# Patient Record
Sex: Male | Born: 1935
Health system: Southern US, Community
[De-identification: ages and names within clinical notes are randomized; demographics above are authoritative.]

## PROBLEM LIST (undated history)

## (undated) DIAGNOSIS — K219 Gastro-esophageal reflux disease without esophagitis: Secondary | ICD-10-CM

## (undated) DIAGNOSIS — I639 Cerebral infarction, unspecified: Secondary | ICD-10-CM

## (undated) DIAGNOSIS — C189 Malignant neoplasm of colon, unspecified: Secondary | ICD-10-CM

## (undated) DIAGNOSIS — D649 Anemia, unspecified: Secondary | ICD-10-CM

## (undated) DIAGNOSIS — R51 Headache: Secondary | ICD-10-CM

## (undated) DIAGNOSIS — R519 Headache, unspecified: Secondary | ICD-10-CM

## (undated) HISTORY — DX: Malignant neoplasm of colon, unspecified: C18.9

## (undated) HISTORY — DX: Headache: R51

## (undated) HISTORY — DX: Headache, unspecified: R51.9

## (undated) HISTORY — DX: Cerebral infarction, unspecified: I63.9

---

## 2004-04-25 ENCOUNTER — Emergency Department (HOSPITAL_COMMUNITY): Admission: EM | Admit: 2004-04-25 | Discharge: 2004-04-25 | Payer: Self-pay | Admitting: Emergency Medicine

## 2005-02-27 ENCOUNTER — Emergency Department (HOSPITAL_COMMUNITY): Admission: EM | Admit: 2005-02-27 | Discharge: 2005-02-27 | Payer: Self-pay | Admitting: *Deleted

## 2010-02-06 ENCOUNTER — Emergency Department (HOSPITAL_COMMUNITY): Admission: EM | Admit: 2010-02-06 | Discharge: 2010-02-06 | Payer: Self-pay | Admitting: Emergency Medicine

## 2010-06-01 ENCOUNTER — Inpatient Hospital Stay (HOSPITAL_COMMUNITY): Admit: 2010-06-01 | Discharge: 2010-06-03 | Payer: Self-pay | Attending: Neurosurgery | Admitting: Neurosurgery

## 2010-06-01 ENCOUNTER — Emergency Department (HOSPITAL_COMMUNITY)
Admission: EM | Admit: 2010-06-01 | Discharge: 2010-06-01 | Disposition: A | Discharge status: Other Acute Inpatient Hospital | Payer: Self-pay | Source: Home / Self Care | Admitting: Emergency Medicine

## 2010-06-24 NOTE — H&P (Signed)
NAME:  William Patel, William Patel               ACCOUNT NO.:  [id number]  MEDICAL RECORD NO.:  [id number]          PATIENT TYPE:  INP  LOCATION:  3102                         FACILITY:  [facility]  PHYSICIAN:  Sherilyn Cooter A. Shirlette Scarber, M.D.    DATE OF BIRTH:  08-13-1933  DATE OF ADMISSION:  06/01/2010 DATE OF DISCHARGE:                             HISTORY & PHYSICAL   ADMITTING DIAGNOSIS:  Subarachnoid hemorrhage, probable post-traumatic although aneurysmal subarachnoid hemorrhage also possible.  HISTORY OF PRESENT ILLNESS:  [name] is a 77 year old male who has history of vertigo who apparently last night following ingesting alcohol fell backwards and struck the back of his head.  The patient has had persistent bilateral retro-orbital headache since this fall.  He denies any headache prior to the fall, although his history is somewhat compromised by his alcohol ingestion the previous night.  The patient states that he has difficulty with vertigo and has had trouble with falls in the past.  The patient apparently took a rather significant fall on board at the back approximately 6 months ago in a similar fashion.  The patient has had no symptoms of seizures.  He has had no nausea or vomiting.  PAST MEDICAL HISTORY:  Notable for probable alcohol abuse.  Otherwise, no other ongoing major medical problems.  CURRENT MEDICATIONS:  None.  ALLERGIES:  None.  FAMILY HISTORY:  None.  SOCIAL HISTORY:  The patient is a nonsmoker.  Denies alcohol abuse or drug abuse.  REVIEW OF SYSTEMS:  Seen in the patient's chart.  It has not been reviewed.  It is noncontributory.  PHYSICAL EXAMINATION:  GENERAL:  On examination, he is pleasant and cooperative to examination.  Appears to medium build medium framed male who does not appear to be uncomfortable. HEAD, EARS, EYES, NOSE, AND THROAT:  Some mild occipital tenderness, but no marked scalp hematoma or swelling.  Oropharynx, nasopharynx, and external auditory canals  are clear. CHEST:  Clear to auscultation. HEART:  Regular rate and rhythm. ABDOMEN:  Benign. EXTREMITIES:  Free of injury. BACK:  Cervical spine has full active range of motion without pain or tenderness. NEUROLOGIC:  He is awake and alert.  He is oriented and appropriate. Cranial nerve function is intact.  Motor and sensory function in extremities is normal.  Deep tendon reflexes are normoactive.  There is no long track signs.  The patient has CT scan of his head.  This demonstrates evidence of nondepressed linear occipital skull fracture extending down to the foramen magnum on the right side.  The patient has had a small amount of blood along his gyrus rectus on the right side with some associated subarachnoid blood.  There is no high-volume of subarachnoid blood in the basilar cisterns however.  Most likely this represents a small post-traumatic subarachnoid hemorrhage, although given the location of this hemorrhage, it is mostly somewhat atypical for a traumatic subarachnoid hemorrhage and it is also in location it would be particularly worrisome for ruptured anterior communicating artery aneurysm.  With that in mind, I think the patient needs to have a CT angio to better evaluate this area.  I will plan on  following up with this later on this evening.  Otherwise, the patient will be admitted to the ICU for neurological monitoring.  Plan is for followup head CT scan in 24 hours.          ______________________________ Kathaleen Maser Shanyce Daris, M.D.     HAP/MEDQ  D:  06/01/2010  T:  06/02/2010  Job:  [id number]  Electronically Signed by Julio Sicks M.D. on 06/24/2010 08:15:05 AM

## 2010-08-11 LAB — ETHANOL: Alcohol, Ethyl (B): <5 mg/dL (ref 0–10)

## 2010-08-11 LAB — COMPREHENSIVE METABOLIC PANEL
ALT: 17 U/L (ref 0–53)
ALT: 14 U/L (ref 0–53)
AST: 25 U/L (ref 0–37)
AST: 23 U/L (ref 0–37)
Albumin: 4 g/dL (ref 3.5–5.2)
Albumin: 3.2 g/dL — ABNORMAL LOW (ref 3.5–5.2)
Alkaline Phosphatase: 62 U/L (ref 39–117)
Alkaline Phosphatase: 50 U/L (ref 39–117)
BUN: 26 mg/dL — ABNORMAL HIGH (ref 6–23)
CO2: 28 mEq/L (ref 19–32)
CO2: 26 mEq/L (ref 19–32)
Calcium: 8.6 mg/dL (ref 8.4–10.5)
Chloride: 100 mEq/L (ref 96–112)
Chloride: 105 mEq/L (ref 96–112)
Creatinine, Ser: 1.15 mg/dL (ref 0.4–1.5)
Creatinine, Ser: 1.08 mg/dL (ref 0.4–1.5)
GFR calc Af Amer: >60 mL/min (ref >60)
GFR calc non Af Amer: >60 mL/min (ref >60)
GFR calc non Af Amer: >60 mL/min (ref >60)
Glucose, Bld: 106 mg/dL — ABNORMAL HIGH (ref 70–99)
Glucose, Bld: 110 mg/dL — ABNORMAL HIGH (ref 70–99)
Potassium: 4.5 mEq/L (ref 3.5–5.1)
Potassium: 3.8 mEq/L (ref 3.5–5.1)
Sodium: 139 mEq/L (ref 135–145)
Sodium: 138 mEq/L (ref 135–145)
Total Bilirubin: 1.4 mg/dL — ABNORMAL HIGH (ref 0.3–1.2)
Total Bilirubin: 1.1 mg/dL (ref 0.3–1.2)
Total Protein: 7.2 g/dL (ref 6.0–8.3)

## 2010-08-11 LAB — DIFFERENTIAL
Basophils Absolute: 0 10*3/uL (ref 0.0–0.1)
Basophils Relative: 0 % (ref 0–1)
Lymphocytes Relative: 8 % — ABNORMAL LOW (ref 12–46)
Lymphs Abs: 0.6 10*3/uL — ABNORMAL LOW (ref 0.7–4.0)
Monocytes Absolute: 0.7 10*3/uL (ref 0.1–1.0)
Neutro Abs: 6.5 10*3/uL (ref 1.7–7.7)
Neutrophils Relative %: 83 % — ABNORMAL HIGH (ref 43–77)

## 2010-08-11 LAB — CBC
HCT: 44.6 % (ref 39.0–52.0)
HCT: 38.3 % — ABNORMAL LOW (ref 39.0–52.0)
Hemoglobin: 15.6 g/dL (ref 13.0–17.0)
Hemoglobin: 13.4 g/dL (ref 13.0–17.0)
MCH: 33.1 pg (ref 26.0–34.0)
MCH: 32.8 pg (ref 26.0–34.0)
MCHC: 35 g/dL (ref 30.0–36.0)
MCV: 94.5 fL (ref 78.0–100.0)
Platelets: 140 10*3/uL — ABNORMAL LOW (ref 150–400)
Platelets: 119 10*3/uL — ABNORMAL LOW (ref 150–400)
RBC: 4.72 MIL/uL (ref 4.22–5.81)
RBC: 4.08 MIL/uL — ABNORMAL LOW (ref 4.22–5.81)
RDW: 12 % (ref 11.5–15.5)
WBC: 7.7 10*3/uL (ref 4.0–10.5)

## 2010-08-11 LAB — PROTIME-INR
INR: 1.04 (ref 0.00–1.49)
INR: 1.02 (ref 0.00–1.49)
Prothrombin Time: 13.8 seconds (ref 11.6–15.2)
Prothrombin Time: 13.6 seconds (ref 11.6–15.2)

## 2010-08-11 LAB — POCT CARDIAC MARKERS: Troponin i, poc: <0.05 ng/mL (ref 0.00–0.09)

## 2010-08-11 LAB — APTT: aPTT: 28 seconds (ref 24–37)

## 2010-08-14 LAB — POCT CARDIAC MARKERS
CKMB, poc: <1 ng/mL — ABNORMAL LOW (ref 1.0–8.0)
Myoglobin, poc: 46.1 ng/mL (ref 12–200)
Troponin i, poc: <0.05 ng/mL (ref 0.00–0.09)

## 2010-08-14 LAB — URINALYSIS, ROUTINE W REFLEX MICROSCOPIC
Bilirubin Urine: NEGATIVE
Glucose, UA: NEGATIVE mg/dL
Hgb urine dipstick: NEGATIVE
Ketones, ur: NEGATIVE mg/dL
Nitrite: NEGATIVE
Protein, ur: NEGATIVE mg/dL
Specific Gravity, Urine: 1.006 (ref 1.005–1.030)
Urobilinogen, UA: 0.2 mg/dL (ref 0.0–1.0)
pH: 5 (ref 5.0–8.0)

## 2010-08-14 LAB — DIFFERENTIAL
Basophils Absolute: 0 10*3/uL (ref 0.0–0.1)
Basophils Relative: 0 % (ref 0–1)
Eosinophils Absolute: 0.2 10*3/uL (ref 0.0–0.7)
Eosinophils Relative: 3 % (ref 0–5)
Lymphocytes Relative: 18 % (ref 12–46)
Lymphs Abs: 1.1 10*3/uL (ref 0.7–4.0)
Monocytes Absolute: 0.5 10*3/uL (ref 0.1–1.0)
Monocytes Relative: 9 % (ref 3–12)
Neutro Abs: 4 10*3/uL (ref 1.7–7.7)
Neutrophils Relative %: 69 % (ref 43–77)

## 2010-08-14 LAB — COMPREHENSIVE METABOLIC PANEL
ALT: 18 U/L (ref 0–53)
AST: 24 U/L (ref 0–37)
Albumin: 4.1 g/dL (ref 3.5–5.2)
Alkaline Phosphatase: 56 U/L (ref 39–117)
BUN: 10 mg/dL (ref 6–23)
CO2: 25 mEq/L (ref 19–32)
Calcium: 9.1 mg/dL (ref 8.4–10.5)
Chloride: 108 mEq/L (ref 96–112)
Creatinine, Ser: 0.94 mg/dL (ref 0.4–1.5)
GFR calc Af Amer: >60 mL/min (ref >60)
GFR calc non Af Amer: >60 mL/min (ref >60)
Glucose, Bld: 77 mg/dL (ref 70–99)
Potassium: 4 mEq/L (ref 3.5–5.1)
Sodium: 142 mEq/L (ref 135–145)
Total Bilirubin: 0.8 mg/dL (ref 0.3–1.2)
Total Protein: 6.9 g/dL (ref 6.0–8.3)

## 2010-08-14 LAB — CBC
HCT: 42.3 % (ref 39.0–52.0)
Hemoglobin: 14.9 g/dL (ref 13.0–17.0)
MCH: 34.1 pg — ABNORMAL HIGH (ref 26.0–34.0)
MCHC: 35.3 g/dL (ref 30.0–36.0)
MCV: 96.6 fL (ref 78.0–100.0)
Platelets: 121 10*3/uL — ABNORMAL LOW (ref 150–400)
RBC: 4.38 MIL/uL (ref 4.22–5.81)
RDW: 12.7 % (ref 11.5–15.5)
WBC: 5.8 10*3/uL (ref 4.0–10.5)

## 2010-08-14 LAB — LIPASE, BLOOD: Lipase: 38 U/L (ref 11–59)

## 2011-06-26 ENCOUNTER — Ambulatory Visit (INDEPENDENT_AMBULATORY_CARE_PROVIDER_SITE_OTHER): Payer: Medicare Other

## 2011-06-26 DIAGNOSIS — M545 Low back pain: Secondary | ICD-10-CM

## 2011-06-26 DIAGNOSIS — H6120 Impacted cerumen, unspecified ear: Secondary | ICD-10-CM

## 2011-06-26 DIAGNOSIS — H8110 Benign paroxysmal vertigo, unspecified ear: Secondary | ICD-10-CM

## 2011-06-26 DIAGNOSIS — H9209 Otalgia, unspecified ear: Secondary | ICD-10-CM

## 2011-07-03 HISTORY — PX: COLONOSCOPY: SHX174

## 2011-07-09 ENCOUNTER — Telehealth: Payer: Self-pay

## 2011-07-09 NOTE — Telephone Encounter (Signed)
.  [facility]  Please fax ov,labs,x-ray etc to [facility] gastrology  [phone]

## 2011-07-10 NOTE — Telephone Encounter (Signed)
Need signed release to send records

## 2011-08-05 ENCOUNTER — Encounter (INDEPENDENT_AMBULATORY_CARE_PROVIDER_SITE_OTHER): Payer: Self-pay | Admitting: General Surgery

## 2011-08-06 ENCOUNTER — Encounter (INDEPENDENT_AMBULATORY_CARE_PROVIDER_SITE_OTHER): Payer: Self-pay | Admitting: Surgery

## 2011-08-06 ENCOUNTER — Encounter (INDEPENDENT_AMBULATORY_CARE_PROVIDER_SITE_OTHER): Payer: Self-pay | Admitting: General Surgery

## 2011-08-06 ENCOUNTER — Ambulatory Visit (INDEPENDENT_AMBULATORY_CARE_PROVIDER_SITE_OTHER): Payer: Medicare Other | Admitting: Surgery

## 2011-08-06 VITALS — BP 130/72 | HR 64 | Temp 98.0°F | Resp 18 | Ht 62.0 in | Wt 135.4 lb

## 2011-08-06 DIAGNOSIS — I499 Cardiac arrhythmia, unspecified: Secondary | ICD-10-CM

## 2011-08-06 DIAGNOSIS — C189 Malignant neoplasm of colon, unspecified: Secondary | ICD-10-CM

## 2011-08-06 DIAGNOSIS — Z720 Tobacco use: Secondary | ICD-10-CM | POA: Insufficient documentation

## 2011-08-06 DIAGNOSIS — F17200 Nicotine dependence, unspecified, uncomplicated: Secondary | ICD-10-CM

## 2011-08-06 DIAGNOSIS — I498 Other specified cardiac arrhythmias: Secondary | ICD-10-CM

## 2011-08-06 NOTE — Patient Instructions (Signed)
Will see the cardiologist for cardiac clearance. Return to see me and we will discuss surgery and can get that scheduled if that is what you decide to do.

## 2011-08-06 NOTE — Progress Notes (Signed)
Chief Complaint:  Left colon cancer  History of Present Illness:  William Patel is an 78 y.o. male :  See following dictation: Mr. Davoli and his family came in to discuss a recent finding by Dr. Evette Cristal of a descending colon cancer. This was found on a screening colonoscopy biopsy-proven adenocarcinoma. He 78 years old and is followed by Dr. Windle Guard. I sat down with him and his children and discussed the surgery in some detail. I mentioned the possibility of a temporary colostomy although don't think that is likely. He has a friend who had this operation about 5 years ago. He was somewhat emotional over the fact that he lost his wife to metastatic melanoma this past year and also lost a daughter to lung cancer within the last year or so. He's never had surgery so he's uncomfortable about think that going to the hospital.  Past medical history medications include meclizine one every 6 hours. He has a review of systems positive for occasional chills nasal congestion, sore throat, visual disturbance, leg swelling, anal rectal problems including hemorrhoids, rectal pain, arthritis, blood in urine, headaches, easy bruisability. His parents died in their late 26s or 90s of a brain tumor brain cancer. He chews tobacco and occasionally smokes cigars. He has previously smoked. He does not use any drugs. He occasionally drinks alcohol he  On physical exam his weight is 135.4 blood pressure 130/72 pulse 64 apical and somewhat irregular with either PVCs orifice his atrial fibrillation. He is 5 feet 2 inches is afebrile and his respirations were unlabored and 18.  HEENT exam is unremarkable neck I cannot discern any bruits. No masses or adenopathy noted. Chest breath sounds were equal bilaterally. Heart has a regular rhythm slow rate in the 60s. Abdomen is nontender. He is not obese. Extremity exam he has full range of motion. He is active. Neuro alert and oriented x3  Emergency function grossly intact. Psych  appears appropriate.  Impression left colon cancer. Recommendation laparoscopically assisted left hemicolectomy. I gave him a booklet on this operation and discussed it with him in some detail. First order of business is to have him evaluated by cardiologist and make sure that his cardiopulmonary status is satisfactory and he can undergo general anesthesia. Would then plan a bowel prep and lap-assisted left hemicolectomy [facility].  Past Medical History  Diagnosis Date  . Vertigo   . Visual disturbances   . Leg swelling   . Rectal bleeding   . Hematuria   . Generalized headaches     No past surgical history on file.  Current Outpatient Prescriptions  Medication Sig Dispense Refill  . meclizine (ANTIVERT) 25 MG tablet Take 25 mg by mouth 3 (three) times daily as needed.       Review of patient's allergies indicates no known allergies. Family History  Problem Relation Age of Onset  . Heart failure Mother    Social History:   reports that he has been smoking Cigars.  His smokeless tobacco use includes Chew. He reports that he drinks alcohol. He reports that he does not use illicit drugs.   REVIEW OF SYSTEMS - PERTINENT POSITIVES ONLY: See above ROS  Physical Exam:   Blood pressure 130/72, pulse 64, temperature 98 F (36.7 C), temperature source Temporal, resp. rate 18, height 5\' 2"  (1.575 m), weight 135 lb 6.4 oz (61.417 kg). Body mass index is 24.76 kg/(m^2).  Gen:  WDWN white male NAD  Neurological: Alert and oriented to person, place, and  time. Motor and sensory function is grossly intact  Head: Normocephalic and atraumatic.  Eyes: Conjunctivae are normal. Pupils are equal, round, and reactive to light. No scleral icterus.  Neck: Normal range of motion. Neck supple. No tracheal deviation or thyromegaly present.  Cardiovascular:  SR without murmurs or gallops.  No carotid bruits Respiratory: Effort normal.  No respiratory distress. No chest wall tenderness. Breath sounds  normal.  No wheezes, rales or rhonchi.  Abdomen:  Nontender.  GU: Musculoskeletal: Normal range of motion. Extremities are nontender. No cyanosis, edema or clubbing noted Lymphadenopathy: No cervical, preauricular, postauricular or axillary adenopathy is present Skin: Skin is warm and dry. No rash noted. No diaphoresis. No erythema. No pallor. Pscyh: Normal mood and affect. Behavior is normal. Judgment and thought content normal.   LABORATORY RESULTS: No results found for this or any previous visit (from the past 48 hour(s)).  RADIOLOGY RESULTS: No results found.  Problem List: There is no problem list on file for this patient.   Assessment & Plan: Appears early left colon cancer.  Will schedule after cardiac clearance.     Matt B. Daphine Deutscher, MD, [facility] [phone] beeper [phone]  08/06/2011 10:04 AM

## 2011-08-13 ENCOUNTER — Encounter: Payer: Self-pay | Admitting: Cardiology

## 2011-08-13 ENCOUNTER — Ambulatory Visit (INDEPENDENT_AMBULATORY_CARE_PROVIDER_SITE_OTHER): Payer: Medicare Other | Admitting: Cardiology

## 2011-08-13 VITALS — BP 122/72 | HR 87 | Ht 62.0 in | Wt 139.0 lb

## 2011-08-13 DIAGNOSIS — I4949 Other premature depolarization: Secondary | ICD-10-CM

## 2011-08-13 DIAGNOSIS — C189 Malignant neoplasm of colon, unspecified: Secondary | ICD-10-CM

## 2011-08-13 DIAGNOSIS — I493 Ventricular premature depolarization: Secondary | ICD-10-CM | POA: Insufficient documentation

## 2011-08-13 DIAGNOSIS — Z01810 Encounter for preprocedural cardiovascular examination: Secondary | ICD-10-CM

## 2011-08-13 NOTE — Progress Notes (Signed)
   William Patel Date of Birth: [birth year] Medical Record #[id number]  History of Present Illness: William Patel is seen at the request of Dr. Daphine Deutscher for preoperative clearance for colon surgery. He is a pleasant 78 year old white male who has been in excellent health. He states he has never been hospitalized. He has no known history of cardiac disease and denies any prior history of myocardial infarction or heart failure. He has been noted to have a skip in his heartbeat. This is asymptomatic. He denies any palpitations or dizziness. He is very active and works as a Visual merchandiser. This includes cutting his own wood and doing his own gardening and plowing. He has had no restrictions in his activity. He has no history of hypertension, diabetes, or hypercholesterolemia. He doesn't smoke cigarettes but does chew tobacco. Recently he had a heme positive stool and subsequent evaluation demonstrated adenocarcinoma. It is recommended that he undergo a left hemicolectomy. He has had no prior surgery.  Current Outpatient Prescriptions on File Prior to Visit  Medication Sig Dispense Refill  . meclizine (ANTIVERT) 25 MG tablet Take 25 mg by mouth 3 (three) times daily as needed.        No Known Allergies  Past Medical History  Diagnosis Date  . Vertigo   . Visual disturbances   . Leg swelling   . Rectal bleeding   . Hematuria   . Generalized headaches   . Colon cancer     Left     Past Surgical History  Procedure Date  . Colonoscopy     History  Smoking status  . Current Some Day Smoker  . Types: Cigars  Smokeless tobacco  . Current User  . Types: Chew    History  Alcohol Use  . Yes    Family History  Problem Relation Age of Onset  . Heart failure Mother   . Brain cancer Mother   . Brain cancer Father     Review of Systems: As noted in history of present illness.  All other systems were reviewed and are negative.  Physical Exam: BP 122/72  Pulse 87  Ht 5\' 2"  (1.575 m)  Wt 139  lb (63.05 kg)  BMI 25.42 kg/m2 He is a pleasant white male in no acute distress. He is normocephalic, atraumatic. Pupils are equal round and reactive to light accommodation. Extraocular movements are full. Oropharynx is clear. Neck is supple without JVD, adenopathy, thyromegaly, or bruits. Lungs are clear. Cardiac exam reveals a regular rate and rhythm without gallop, murmur, or click. Abdomen is soft and nontender. He has no masses or bruits lung exam. Femoral and pedal pulses are 2+ and symmetric. His hands are heavily calloused. Skin is warm and dry. He is alert and oriented x3. Cranial nerves II through XII are intact. LABORATORY DATA: ECG demonstrates normal sinus rhythm with occasional unifocal PVC. It is otherwise normal.  Assessment / Plan:

## 2011-08-13 NOTE — Patient Instructions (Signed)
We will clear you for your colon surgery and send notice to Dr. Daphine Deutscher  I would avoid caffeine and tobacco products.

## 2011-08-13 NOTE — Assessment & Plan Note (Signed)
He has unifocal PVCs that are asymptomatic. The scary a low risk. I have recommended that he restrict his caffeine intake and aluminate tobacco.

## 2011-08-13 NOTE — Assessment & Plan Note (Addendum)
I think that William Patel is an acceptable risk for colon surgery. He has no history of cardiac disease. He is asymptomatic and is clearly able to work to a greater than 7 METs level. Other than PVCs his ECG is normal. His exam is normal. I don't feel that further preoperative testing is indicated given his low risk by history and exam. We will go ahead and clear him for his planned surgery.

## 2011-09-03 ENCOUNTER — Encounter (INDEPENDENT_AMBULATORY_CARE_PROVIDER_SITE_OTHER): Payer: Self-pay | Admitting: Surgery

## 2011-09-03 ENCOUNTER — Ambulatory Visit (INDEPENDENT_AMBULATORY_CARE_PROVIDER_SITE_OTHER): Payer: Medicare Other | Admitting: Surgery

## 2011-09-03 ENCOUNTER — Other Ambulatory Visit (INDEPENDENT_AMBULATORY_CARE_PROVIDER_SITE_OTHER): Payer: Self-pay | Admitting: Surgery

## 2011-09-03 VITALS — BP 133/78 | HR 88 | Temp 97.8°F | Resp 16 | Ht 61.75 in | Wt 138.0 lb

## 2011-09-03 DIAGNOSIS — C189 Malignant neoplasm of colon, unspecified: Secondary | ICD-10-CM

## 2011-09-03 NOTE — Progress Notes (Signed)
Chief Complaint: Left colon cancer  History of Present Illness: William Patel is an 78 y.o. male : See following dictation:  Mr. Marxen and his family came in to discuss a recent finding by Dr. Evette Cristal of a descending colon cancer. This was found on a screening colonoscopy biopsy-proven adenocarcinoma. He 78 years old and is followed by Dr. Windle Guard. I sat down with him and his children and discussed the surgery in some detail. I mentioned the possibility of a temporary colostomy although don't think that is likely. He has a friend who had this operation about 5 years ago. He was somewhat emotional over the fact that he lost his wife to metastatic melanoma this past year and also lost a daughter to lung cancer within the last year or so. He's never had surgery so he's uncomfortable about think that going to the hospital.  Past medical history medications include meclizine one every 6 hours. He has a review of systems positive for occasional chills nasal congestion, sore throat, visual disturbance, leg swelling, anal rectal problems including hemorrhoids, rectal pain, arthritis, blood in urine, headaches, easy bruisability. His parents died in their late 47s or 90s of a brain tumor brain cancer. He chews tobacco and occasionally smokes cigars. He has previously smoked. He does not use any drugs. He occasionally drinks alcohol he  On physical exam his weight is 135.4 blood pressure 130/72 pulse 64 apical and somewhat irregular with either PVCs orifice his atrial fibrillation. He is 5 feet 2 inches is afebrile and his respirations were unlabored and 18.  HEENT exam is unremarkable neck I cannot discern any bruits. No masses or adenopathy noted. Chest breath sounds were equal bilaterally. Heart has a regular rhythm slow rate in the 60s. Abdomen is nontender. He is not obese. Extremity exam he has full range of motion. He is active. Neuro alert and oriented x3  Emergency function grossly intact. Psych appears  appropriate.  Impression left colon cancer. Recommendation laparoscopically assisted left hemicolectomy. I gave him a booklet on this operation and discussed it with him in some detail. First order of business is to have him evaluated by cardiologist and make sure that his cardiopulmonary status is satisfactory and he can undergo general anesthesia. Would then plan a bowel prep and lap-assisted left hemicolectomy [facility].  Past Medical History   Diagnosis  Date   .  Vertigo    .  Visual disturbances    .  Leg swelling    .  Rectal bleeding    .  Hematuria    .  Generalized headaches     No past surgical history on file.  Current Outpatient Prescriptions   Medication  Sig  Dispense  Refill   .  meclizine (ANTIVERT) 25 MG tablet  Take 25 mg by mouth 3 (three) times daily as needed.      Review of patient's allergies indicates no known allergies.  Family History   Problem  Relation  Age of Onset   .  Heart failure  Mother     Social History: reports that he has been smoking Cigars. His smokeless tobacco use includes Chew. He reports that he drinks alcohol. He reports that he does not use illicit drugs.  REVIEW OF SYSTEMS - PERTINENT POSITIVES ONLY:  See above ROS  Physical Exam:  Blood pressure 130/72, pulse 64, temperature 98 F (36.7 C), temperature source Temporal, resp. rate 18, height 5\' 2"  (1.575 m), weight 135 lb 6.4 oz (61.417 kg).  Body mass index is 24.76 kg/(m^2).  Gen: WDWN white male NAD  Neurological: Alert and oriented to person, place, and time. Motor and sensory function is grossly intact  Head: Normocephalic and atraumatic.  Eyes: Conjunctivae are normal. Pupils are equal, round, and reactive to light. No scleral icterus.  Neck: Normal range of motion. Neck supple. No tracheal deviation or thyromegaly present.  Cardiovascular: SR without murmurs or gallops. No carotid bruits  Respiratory: Effort normal. No respiratory distress. No chest wall tenderness. Breath  sounds normal. No wheezes, rales or rhonchi.  Abdomen: Nontender.  GU:  Musculoskeletal: Normal range of motion. Extremities are nontender. No cyanosis, edema or clubbing noted Lymphadenopathy: No cervical, preauricular, postauricular or axillary adenopathy is present Skin: Skin is warm and dry. No rash noted. No diaphoresis. No erythema. No pallor. Pscyh: Normal mood and affect. Behavior is normal. Judgment and thought content normal.  LABORATORY RESULTS:  No results found for this or any previous visit (from the past 48 hour(s)).  RADIOLOGY RESULTS:  No results found.  Problem List:  There is no problem list on file for this patient.   Assessment & Plan:  Appears early left colon cancer. Had cardiac clearance by Dr. Peter [place].  He and is friend came for preop visit and bowel prep given.  Matt B. Daphine Deutscher, MD, [facility]  [phone] beeper  [phone]

## 2011-09-03 NOTE — Patient Instructions (Signed)
Take bowel prep as given

## 2011-09-14 ENCOUNTER — Encounter (HOSPITAL_COMMUNITY): Payer: Self-pay | Admitting: Pharmacy Technician

## 2011-09-16 ENCOUNTER — Encounter (HOSPITAL_COMMUNITY): Payer: Self-pay

## 2011-09-16 ENCOUNTER — Encounter (HOSPITAL_COMMUNITY)
Admission: RE | Admit: 2011-09-16 | Discharge: 2011-09-16 | Disposition: A | Discharge status: Home / Self Care | Payer: Medicare Other | Source: Ambulatory Visit | Attending: Surgery | Admitting: Surgery

## 2011-09-16 LAB — SURGICAL PCR SCREEN
MRSA, PCR: NEGATIVE
Staphylococcus aureus: NEGATIVE

## 2011-09-16 LAB — CBC
Platelets: 145 10*3/uL — ABNORMAL LOW (ref 150–400)
RBC: 4.62 MIL/uL (ref 4.22–5.81)
WBC: 5.8 10*3/uL (ref 4.0–10.5)

## 2011-09-16 NOTE — Patient Instructions (Addendum)
20 William Patel  09/16/2011   Your procedure is scheduled on:  Monday 09-21-2011  Report to [facility] at 1230 PM.  Call this number if you have problems the morning of surgery: [phone]   Remember:fleets enema night before surgery, follow one day bowel prep instructions from dr Daphine Deutscher.             stop advil 5 days before surgery  Do not  drink liquids:After Midnight.  .  Take these medicines the morning of surgery with A SIP OF WATER: claritin if needed, no chewing tobacco day of surgery.  Do not wear jewelry or make up.  Do not wear lotions, powders, or perfumes.Do not wear deodorant.    Do not bring valuables to the hospital.  Contacts, dentures or bridgework may not be worn into surgery.  Leave suitcase in the car. After surgery it may be brought to your room.  For patients admitted to the hospital, checkout time is 11:00 AM the day of discharge.     Special Instructions: CHG Shower Use Special Wash: 1/2 bottle night before surgery and 1/2 bottle morning of surgery, neck down avoid front and back private area   Please read over the following fact sheets that you were given: MRSA Information, blood fact sheet  [name] pre op nurse phone number [phone], call if needed

## 2011-09-21 ENCOUNTER — Encounter (HOSPITAL_COMMUNITY): Payer: Self-pay | Admitting: Anesthesiology

## 2011-09-21 ENCOUNTER — Ambulatory Visit (HOSPITAL_COMMUNITY): Payer: Medicare Other | Admitting: Anesthesiology

## 2011-09-21 ENCOUNTER — Inpatient Hospital Stay (HOSPITAL_COMMUNITY)
Admission: RE | Admit: 2011-09-21 | Discharge: 2011-09-27 | DRG: 330 | Disposition: A | Discharge status: Home / Self Care | Payer: Medicare Other | Source: Ambulatory Visit | Attending: Surgery | Admitting: Surgery

## 2011-09-21 ENCOUNTER — Encounter (HOSPITAL_COMMUNITY)
Admission: RE | Disposition: A | Discharge status: Home / Self Care | Payer: Self-pay | Source: Ambulatory Visit | Attending: Surgery

## 2011-09-21 ENCOUNTER — Encounter (HOSPITAL_COMMUNITY): Payer: Self-pay | Admitting: *Deleted

## 2011-09-21 DIAGNOSIS — I4949 Other premature depolarization: Secondary | ICD-10-CM | POA: Diagnosis present

## 2011-09-21 DIAGNOSIS — C184 Malignant neoplasm of transverse colon: Secondary | ICD-10-CM

## 2011-09-21 DIAGNOSIS — Z01810 Encounter for preprocedural cardiovascular examination: Secondary | ICD-10-CM

## 2011-09-21 DIAGNOSIS — K648 Other hemorrhoids: Secondary | ICD-10-CM | POA: Diagnosis present

## 2011-09-21 DIAGNOSIS — C189 Malignant neoplasm of colon, unspecified: Secondary | ICD-10-CM

## 2011-09-21 DIAGNOSIS — F17200 Nicotine dependence, unspecified, uncomplicated: Secondary | ICD-10-CM | POA: Diagnosis present

## 2011-09-21 DIAGNOSIS — M129 Arthropathy, unspecified: Secondary | ICD-10-CM | POA: Diagnosis present

## 2011-09-21 DIAGNOSIS — Z79899 Other long term (current) drug therapy: Secondary | ICD-10-CM

## 2011-09-21 DIAGNOSIS — K560 Paralytic ileus: Secondary | ICD-10-CM | POA: Diagnosis not present

## 2011-09-21 HISTORY — PX: COLON RESECTION: SHX5231

## 2011-09-21 LAB — CREATININE, SERUM
Creatinine, Ser: 1.06 mg/dL (ref 0.50–1.35)
GFR calc Af Amer: 77 mL/min — ABNORMAL LOW (ref >90)

## 2011-09-21 LAB — CBC
Platelets: 122 10*3/uL — ABNORMAL LOW (ref 150–400)
RBC: 4.17 MIL/uL — ABNORMAL LOW (ref 4.22–5.81)
WBC: 5.1 10*3/uL (ref 4.0–10.5)

## 2011-09-21 LAB — TYPE AND SCREEN: Antibody Screen: NEGATIVE

## 2011-09-21 LAB — ABO/RH: ABO/RH(D): O POS

## 2011-09-21 SURGERY — COLON RESECTION LAPAROSCOPIC
Anesthesia: General | Site: Abdomen | Wound class: Clean Contaminated

## 2011-09-21 MED ORDER — ONDANSETRON HCL 4 MG/2ML IJ SOLN
INTRAMUSCULAR | Status: DC | PRN
  Administered 2011-09-21: 4 mg via INTRAVENOUS

## 2011-09-21 MED ORDER — KETAMINE HCL 10 MG/ML IJ SOLN
INTRAMUSCULAR | Status: DC | PRN
  Administered 2011-09-21: 20 mg via INTRAVENOUS
  Administered 2011-09-21 (×3): 10 mg via INTRAVENOUS

## 2011-09-21 MED ORDER — ACETAMINOPHEN 10 MG/ML IV SOLN
INTRAVENOUS | Status: AC
  Filled 2011-09-21: qty 100

## 2011-09-21 MED ORDER — LACTATED RINGERS IR SOLN
Status: DC | PRN
  Administered 2011-09-21: 1000 mL

## 2011-09-21 MED ORDER — HYDROMORPHONE HCL PF 1 MG/ML IJ SOLN
0.2500 mg | INTRAMUSCULAR | Status: DC | PRN
  Administered 2011-09-21: 0.25 mg via INTRAVENOUS

## 2011-09-21 MED ORDER — FENTANYL CITRATE 0.05 MG/ML IJ SOLN
INTRAMUSCULAR | Status: DC | PRN
  Administered 2011-09-21: 50 ug via INTRAVENOUS
  Administered 2011-09-21: 100 ug via INTRAVENOUS
  Administered 2011-09-21 (×5): 50 ug via INTRAVENOUS

## 2011-09-21 MED ORDER — HETASTARCH-ELECTROLYTES 6 % IV SOLN
INTRAVENOUS | Status: DC | PRN
  Administered 2011-09-21: 17:00:00 via INTRAVENOUS

## 2011-09-21 MED ORDER — HEPARIN SODIUM (PORCINE) 5000 UNIT/ML IJ SOLN
INTRAMUSCULAR | Status: AC
  Filled 2011-09-21: qty 1

## 2011-09-21 MED ORDER — PROPOFOL 10 MG/ML IV EMUL
INTRAVENOUS | Status: DC | PRN
  Administered 2011-09-21: 110 mg via INTRAVENOUS

## 2011-09-21 MED ORDER — ACETAMINOPHEN 10 MG/ML IV SOLN
INTRAVENOUS | Status: DC | PRN
  Administered 2011-09-21: 1000 mg via INTRAVENOUS

## 2011-09-21 MED ORDER — HYDROMORPHONE HCL PF 1 MG/ML IJ SOLN
INTRAMUSCULAR | Status: DC | PRN
  Administered 2011-09-21: .6 mg via INTRAVENOUS

## 2011-09-21 MED ORDER — CISATRACURIUM BESYLATE 2 MG/ML IV SOLN
INTRAVENOUS | Status: DC | PRN
  Administered 2011-09-21: 4 mg via INTRAVENOUS
  Administered 2011-09-21: 8 mg via INTRAVENOUS
  Administered 2011-09-21: 4 mg via INTRAVENOUS

## 2011-09-21 MED ORDER — NICOTINE 7 MG/24HR TD PT24
7.0000 mg | MEDICATED_PATCH | Freq: Every day | TRANSDERMAL | Status: DC
  Administered 2011-09-21 – 2011-09-27 (×7): 7 mg via TRANSDERMAL
  Filled 2011-09-21 (×7): qty 1

## 2011-09-21 MED ORDER — MORPHINE SULFATE 2 MG/ML IJ SOLN
0.5000 mg | INTRAMUSCULAR | Status: DC | PRN
  Administered 2011-09-22 – 2011-09-25 (×11): 0.5 mg via INTRAVENOUS
  Filled 2011-09-21 (×10): qty 1

## 2011-09-21 MED ORDER — BUPIVACAINE HCL 0.5 % IJ SOLN
INTRAMUSCULAR | Status: AC
  Filled 2011-09-21: qty 1

## 2011-09-21 MED ORDER — MEPERIDINE HCL 50 MG/ML IJ SOLN: 6.2500 mg | INTRAMUSCULAR | Status: DC | PRN

## 2011-09-21 MED ORDER — KCL IN DEXTROSE-NACL 20-5-0.45 MEQ/L-%-% IV SOLN
INTRAVENOUS | Status: DC
  Administered 2011-09-21 – 2011-09-22 (×2): 100 mL/h via INTRAVENOUS
  Administered 2011-09-22 – 2011-09-23 (×3): via INTRAVENOUS
  Administered 2011-09-24: 100 mL via INTRAVENOUS
  Administered 2011-09-24: 17:00:00 via INTRAVENOUS
  Administered 2011-09-25: 100 mL via INTRAVENOUS
  Administered 2011-09-25 – 2011-09-27 (×3): via INTRAVENOUS
  Filled 2011-09-21 (×14): qty 1000

## 2011-09-21 MED ORDER — HYDROMORPHONE HCL PF 1 MG/ML IJ SOLN
INTRAMUSCULAR | Status: AC
  Filled 2011-09-21: qty 1

## 2011-09-21 MED ORDER — LACTATED RINGERS IV SOLN: INTRAVENOUS | Status: DC

## 2011-09-21 MED ORDER — LIDOCAINE HCL (CARDIAC) 20 MG/ML IV SOLN
INTRAVENOUS | Status: DC | PRN
  Administered 2011-09-21: 75 mg via INTRAVENOUS

## 2011-09-21 MED ORDER — BUPIVACAINE LIPOSOME 1.3 % IJ SUSP
20.0000 mL | Freq: Once | INTRAMUSCULAR | Status: AC
  Administered 2011-09-21: 20 mL
  Filled 2011-09-21: qty 20

## 2011-09-21 MED ORDER — DEXAMETHASONE SODIUM PHOSPHATE 10 MG/ML IJ SOLN
INTRAMUSCULAR | Status: DC | PRN
  Administered 2011-09-21: 6 mg via INTRAVENOUS

## 2011-09-21 MED ORDER — HEPARIN SODIUM (PORCINE) 5000 UNIT/ML IJ SOLN
5000.0000 [IU] | Freq: Three times a day (TID) | INTRAMUSCULAR | Status: DC
  Administered 2011-09-21 – 2011-09-27 (×17): 5000 [IU] via SUBCUTANEOUS
  Filled 2011-09-21 (×20): qty 1

## 2011-09-21 MED ORDER — LACTATED RINGERS IV SOLN
INTRAVENOUS | Status: DC
  Administered 2011-09-21: 1000 mL via INTRAVENOUS
  Administered 2011-09-21 (×2): via INTRAVENOUS

## 2011-09-21 MED ORDER — PROMETHAZINE HCL 25 MG/ML IJ SOLN: 6.2500 mg | INTRAMUSCULAR | Status: DC | PRN

## 2011-09-21 MED ORDER — SODIUM CHLORIDE 0.9 % IV SOLN
1.0000 g | INTRAVENOUS | Status: AC
  Administered 2011-09-21: 1 g via INTRAVENOUS

## 2011-09-21 MED ORDER — 0.9 % SODIUM CHLORIDE (POUR BTL) OPTIME
TOPICAL | Status: DC | PRN
  Administered 2011-09-21: 2000 mL

## 2011-09-21 MED ORDER — PANTOPRAZOLE SODIUM 40 MG IV SOLR
40.0000 mg | Freq: Every day | INTRAVENOUS | Status: DC
  Administered 2011-09-21 – 2011-09-25 (×5): 40 mg via INTRAVENOUS
  Filled 2011-09-21 (×6): qty 40

## 2011-09-21 MED ORDER — HEPARIN SODIUM (PORCINE) 5000 UNIT/ML IJ SOLN
5000.0000 [IU] | Freq: Once | INTRAMUSCULAR | Status: AC
  Administered 2011-09-21: 5000 [IU] via SUBCUTANEOUS

## 2011-09-21 MED ORDER — ONDANSETRON HCL 4 MG/2ML IJ SOLN: 4.0000 mg | Freq: Four times a day (QID) | INTRAMUSCULAR | Status: DC | PRN

## 2011-09-21 MED ORDER — SODIUM CHLORIDE 0.9 % IV SOLN
INTRAVENOUS | Status: AC
  Filled 2011-09-21: qty 1

## 2011-09-21 MED ORDER — SUCCINYLCHOLINE CHLORIDE 20 MG/ML IJ SOLN
INTRAMUSCULAR | Status: DC | PRN
  Administered 2011-09-21: 60 mg via INTRAVENOUS

## 2011-09-21 SURGICAL SUPPLY — 76 items
APPLICATOR COTTON TIP 6IN STRL (MISCELLANEOUS) ×2 IMPLANT
BLADE EXTENDED COATED 6.5IN (ELECTRODE) IMPLANT
BLADE HEX COATED 2.75 (ELECTRODE) ×3 IMPLANT
BLADE SURG SZ10 CARB STEEL (BLADE) ×2 IMPLANT
CABLE HIGH FREQUENCY MONO STRZ (ELECTRODE) ×2 IMPLANT
CANISTER SUCTION 2500CC (MISCELLANEOUS) ×1 IMPLANT
CELLS DAT CNTRL 66122 CELL SVR (MISCELLANEOUS) ×2 IMPLANT
CLIP TI LARGE 6 (CLIP) IMPLANT
CLOTH BEACON ORANGE TIMEOUT ST (SAFETY) ×3 IMPLANT
COVER MAYO STAND STRL (DRAPES) ×3 IMPLANT
DRAPE LAPAROSCOPIC ABDOMINAL (DRAPES) ×3 IMPLANT
DRAPE LG THREE QUARTER DISP (DRAPES) ×2 IMPLANT
DRAPE WARM FLUID 44X44 (DRAPE) ×3 IMPLANT
DRSG PAD ABDOMINAL 8X10 ST (GAUZE/BANDAGES/DRESSINGS) IMPLANT
DRSG TEGADERM 4X4.75 (GAUZE/BANDAGES/DRESSINGS) ×2 IMPLANT
ELECT REM PT RETURN 9FT ADLT (ELECTROSURGICAL) ×3
ELECTRODE REM PT RTRN 9FT ADLT (ELECTROSURGICAL) ×2 IMPLANT
ENSEAL DEVICE STD TIP 35CM (ENDOMECHANICALS) ×1 IMPLANT
GLOVE BIOGEL PI IND STRL 6.5 (GLOVE) ×4 IMPLANT
GLOVE BIOGEL PI IND STRL 7.0 (GLOVE) ×5 IMPLANT
GLOVE BIOGEL PI IND STRL 7.5 (GLOVE) ×1 IMPLANT
GLOVE BIOGEL PI INDICATOR 6.5 (GLOVE) ×4
GLOVE BIOGEL PI INDICATOR 7.0 (GLOVE) ×4
GLOVE BIOGEL PI INDICATOR 7.5 (GLOVE) ×1
GLOVE ECLIPSE 8.0 STRL XLNG CF (GLOVE) ×1 IMPLANT
GLOVE SS BIOGEL STRL SZ 7.5 (GLOVE) ×2 IMPLANT
GLOVE SUPERSENSE BIOGEL SZ 7.5 (GLOVE) ×2
GLOVE SURG SS PI 6.5 STRL IVOR (GLOVE) ×6 IMPLANT
GLOVE SURG SS PI 7.5 STRL IVOR (GLOVE) ×4 IMPLANT
GOWN STRL NON-REIN LRG LVL3 (GOWN DISPOSABLE) ×7 IMPLANT
GOWN STRL REIN XL XLG (GOWN DISPOSABLE) ×10 IMPLANT
HAND ACTIVATED (MISCELLANEOUS) ×2 IMPLANT
KIT BASIN OR (CUSTOM PROCEDURE TRAY) ×3 IMPLANT
LEGGING LITHOTOMY PAIR STRL (DRAPES) ×2 IMPLANT
LIGASURE IMPACT 36 18CM CVD LR (INSTRUMENTS) ×1 IMPLANT
MANIFOLD NEPTUNE II (INSTRUMENTS) ×2 IMPLANT
NS IRRIG 1000ML POUR BTL (IV SOLUTION) ×6 IMPLANT
PENCIL BUTTON HOLSTER BLD 10FT (ELECTRODE) ×2 IMPLANT
RELOAD PROXIMATE 75MM BLUE (ENDOMECHANICALS) ×6 IMPLANT
RELOAD STAPLE 75 3.8 BLU REG (ENDOMECHANICALS) IMPLANT
RETRACTOR WND ALEXIS 18 MED (MISCELLANEOUS) IMPLANT
RTRCTR WOUND ALEXIS 18CM MED (MISCELLANEOUS) ×3
SCRUB TECHNI CARE 4 OZ NO DYE (MISCELLANEOUS) ×2 IMPLANT
SEALER TISSUE G2 CVD JAW 35 (ENDOMECHANICALS) IMPLANT
SEALER TISSUE G2 CVD JAW 45CM (ENDOMECHANICALS)
SET IRRIG TUBING LAPAROSCOPIC (IRRIGATION / IRRIGATOR) ×2 IMPLANT
SOLUTION ANTI FOG 6CC (MISCELLANEOUS) ×2 IMPLANT
SPONGE GAUZE 4X4 12PLY (GAUZE/BANDAGES/DRESSINGS) ×3 IMPLANT
SPONGE LAP 18X18 X RAY DECT (DISPOSABLE) ×4 IMPLANT
STAPLER PROXIMATE 75MM BLUE (STAPLE) ×2 IMPLANT
STAPLER VISISTAT 35W (STAPLE) ×3 IMPLANT
SUCTION POOLE TIP (SUCTIONS) ×3 IMPLANT
SUT NOV 1 T60/GS (SUTURE) IMPLANT
SUT NOVA NAB DX-16 0-1 5-0 T12 (SUTURE) IMPLANT
SUT NOVA T20/GS 25 (SUTURE) IMPLANT
SUT PDS AB 1 TP1 96 (SUTURE) ×8 IMPLANT
SUT PDS AB 4-0 SH 27 (SUTURE) ×4 IMPLANT
SUT SILK 2 0 (SUTURE) ×3
SUT SILK 2 0 SH CR/8 (SUTURE) ×3 IMPLANT
SUT SILK 2 0SH CR/8 30 (SUTURE) IMPLANT
SUT SILK 2-0 18XBRD TIE 12 (SUTURE) ×2 IMPLANT
SUT SILK 2-0 30XBRD TIE 12 (SUTURE) IMPLANT
SUT SILK 3 0 (SUTURE) ×3
SUT SILK 3 0 SH CR/8 (SUTURE) ×5 IMPLANT
SUT SILK 3-0 18XBRD TIE 12 (SUTURE) ×3 IMPLANT
SUT VIC AB 4-0 SH 18 (SUTURE) ×2 IMPLANT
SYR BULB IRRIGATION 50ML (SYRINGE) ×2 IMPLANT
TAPE CLOTH SURG 4X10 WHT LF (GAUZE/BANDAGES/DRESSINGS) ×2 IMPLANT
TOWEL OR 17X26 10 PK STRL BLUE (TOWEL DISPOSABLE) ×6 IMPLANT
TRAY FOLEY CATH 14FRSI W/METER (CATHETERS) ×3 IMPLANT
TRAY LAP CHOLE (CUSTOM PROCEDURE TRAY) ×2 IMPLANT
TROCAR XCEL NON-BLD 5MMX100MML (ENDOMECHANICALS) ×6 IMPLANT
TUBING FILTER THERMOFLATOR (ELECTROSURGICAL) ×2 IMPLANT
WATER STERILE IRR 1500ML POUR (IV SOLUTION) IMPLANT
YANKAUER SUCT BULB TIP 10FT TU (MISCELLANEOUS) ×4 IMPLANT
YANKAUER SUCT BULB TIP NO VENT (SUCTIONS) ×3 IMPLANT

## 2011-09-21 NOTE — Transfer of Care (Signed)
Immediate Anesthesia Transfer of Care Note  Patient: William Patel  Procedure(s) Performed: Procedure(s) (LRB): COLON RESECTION LAPAROSCOPIC (N/A)  Patient Location: PACU  Anesthesia Type: General  Level of Consciousness: sedated  Airway & Oxygen Therapy: Patient Spontanous Breathing and Patient connected to face mask oxygen  Post-op Assessment: Report given to PACU RN and Post -op Vital signs reviewed and stable  Post vital signs: Reviewed and stable  Complications: No apparent anesthesia complications

## 2011-09-21 NOTE — Preoperative (Signed)
Beta Blockers   Reason not to administer Beta Blockers:Not Applicable 

## 2011-09-21 NOTE — Anesthesia Preprocedure Evaluation (Signed)
Anesthesia Evaluation  Patient identified by MRN, date of birth, ID band Patient awake    Reviewed: Allergy & Precautions, H&P , NPO status , Patient's Chart, lab work & pertinent test results  Airway Mallampati: II TM Distance: >3 FB Neck ROM: full    Dental No notable dental hx. (+) Edentulous Upper and Edentulous Lower   Pulmonary neg pulmonary ROS,  breath sounds clear to auscultation  Pulmonary exam normal       Cardiovascular Exercise Tolerance: Good negative cardio ROS  - dysrhythmias Rhythm:regular Rate:Normal     Neuro/Psych negative neurological ROS  negative psych ROS   GI/Hepatic negative GI ROS, Neg liver ROS,   Endo/Other  negative endocrine ROS  Renal/GU negative Renal ROS  negative genitourinary   Musculoskeletal   Abdominal   Peds  Hematology negative hematology ROS (+)   Anesthesia Other Findings   Reproductive/Obstetrics negative OB ROS                           Anesthesia Physical Anesthesia Plan  ASA: II  Anesthesia Plan: General   Post-op Pain Management:    Induction:   Airway Management Planned:   Additional Equipment:   Intra-op Plan:   Post-operative Plan:   Informed Consent: I have reviewed the patients History and Physical, chart, labs and discussed the procedure including the risks, benefits and alternatives for the proposed anesthesia with the patient or authorized representative who has indicated his/her understanding and acceptance.   Dental Advisory Given  Plan Discussed with: CRNA  Anesthesia Plan Comments:         Anesthesia Quick Evaluation

## 2011-09-21 NOTE — Anesthesia Procedure Notes (Signed)
Procedure Name: Intubation Date/Time: 09/21/2011 4:44 PM Performed by: Leroy Libman L Patient Re-evaluated:Patient Re-evaluated prior to inductionOxygen Delivery Method: Circle system utilized Preoxygenation: Pre-oxygenation with 100% oxygen Intubation Type: IV induction Ventilation: Mask ventilation without difficulty Laryngoscope Size: Miller and 3 Grade View: Grade I Tube type: Oral Tube size: 8.0 mm Number of attempts: 1 Airway Equipment and Method: Stylet Placement Confirmation: ETT inserted through vocal cords under direct vision,  breath sounds checked- equal and bilateral and positive ETCO2 Secured at: 21 cm Tube secured with: Tape Dental Injury: Teeth and Oropharynx as per pre-operative assessment

## 2011-09-21 NOTE — Anesthesia Postprocedure Evaluation (Signed)
  Anesthesia Post-op Note  Patient: William Patel  Procedure(s) Performed: Procedure(s) (LRB): COLON RESECTION LAPAROSCOPIC (N/A)  Patient Location: PACU  Anesthesia Type: General  Level of Consciousness: oriented and sedated  Airway and Oxygen Therapy: Patient Spontanous Breathing and Patient connected to nasal cannula oxygen  Post-op Pain: mild  Post-op Assessment: Post-op Vital signs reviewed, Patient's Cardiovascular Status Stable, Respiratory Function Stable and Patent Airway  Post-op Vital Signs: stable  Complications: No apparent anesthesia complications

## 2011-09-21 NOTE — H&P (Signed)
Chief Complaint: Left colon cancer  History of Present Illness: William Patel is an 77 y.o. male : See following dictation:  William Patel and his family came in to discuss a recent finding by William Patel of a descending colon cancer. This was found on a screening colonoscopy biopsy-proven adenocarcinoma. He 77 years old and is followed by William Patel. I sat down with him and his children and discussed the surgery in some detail. I mentioned the possibility of a temporary colostomy although don't think that is likely. He has a friend who had this operation about 5 years ago. He was somewhat emotional over the fact that he lost his wife to metastatic melanoma this past year and also lost a daughter to lung cancer within the last year or so. He's never had surgery so he's uncomfortable about think that going to the hospital.  Past medical history medications include meclizine one every 6 hours. He has a review of systems positive for occasional chills nasal congestion, sore throat, visual disturbance, leg swelling, anal rectal problems including hemorrhoids, rectal pain, arthritis, blood in urine, headaches, easy bruisability. His parents died in their late 80s or 90s of a brain tumor brain cancer. He chews tobacco and occasionally smokes cigars. He has previously smoked. He does not use any drugs. He occasionally drinks alcohol he  On physical exam his weight is 135.4 blood pressure 130/72 pulse 64 apical and somewhat irregular with either PVCs orifice his atrial fibrillation. He is 5 feet 2 inches is afebrile and his respirations were unlabored and 18.  HEENT exam is unremarkable neck I cannot discern any bruits. No masses or adenopathy noted. Chest breath sounds were equal bilaterally. Heart has a regular rhythm slow rate in the 60s. Abdomen is nontender. He is not obese. Extremity exam he has full range of motion. He is active. Neuro alert and oriented x3  Emergency function grossly intact. Psych appears  appropriate.  Impression left colon cancer. Recommendation laparoscopically assisted left hemicolectomy. I gave him a booklet on this operation and discussed it with him in some detail. First order of business is to have him evaluated by cardiologist and make sure that his cardiopulmonary status is satisfactory and he can undergo general anesthesia. Would then plan a bowel prep and lap-assisted left hemicolectomy [facility].  Past Medical History   Diagnosis  Date   .  Vertigo    .  Visual disturbances    .  Leg swelling    .  Rectal bleeding    .  Hematuria    .  Generalized headaches     No past surgical history on file.  Current Outpatient Prescriptions   Medication  Sig  Dispense  Refill   .  meclizine (ANTIVERT) 25 MG tablet  Take 25 mg by mouth 3 (three) times daily as needed.      Review of patient's allergies indicates no known allergies.  Family History   Problem  Relation  Age of Onset   .  Heart failure  Mother     Social History: reports that he has been smoking Cigars. His smokeless tobacco use includes Chew. He reports that he drinks alcohol. He reports that he does not use illicit drugs.  REVIEW OF SYSTEMS - PERTINENT POSITIVES ONLY:  See above ROS  Physical Exam:  Blood pressure 130/72, pulse 64, temperature 98 F (36.7 C), temperature source Temporal, resp. rate 18, height 5' 2" (1.575 m), weight 135 lb 6.4 oz (61.417 kg).    Body mass index is 24.76 kg/(m^2).  Gen: WDWN white male NAD  Neurological: Alert and oriented to person, place, and time. Motor and sensory function is grossly intact  Head: Normocephalic and atraumatic.  Eyes: Conjunctivae are normal. Pupils are equal, round, and reactive to light. No scleral icterus.  Neck: Normal range of motion. Neck supple. No tracheal deviation or thyromegaly present.  Cardiovascular: SR without murmurs or gallops. No carotid bruits  Respiratory: Effort normal. No respiratory distress. No chest wall tenderness. Breath  sounds normal. No wheezes, rales or rhonchi.  Abdomen: Nontender.  GU:  Musculoskeletal: Normal range of motion. Extremities are nontender. No cyanosis, edema or clubbing noted Lymphadenopathy: No cervical, preauricular, postauricular or axillary adenopathy is present Skin: Skin is warm and dry. No rash noted. No diaphoresis. No erythema. No pallor. Pscyh: Normal mood and affect. Behavior is normal. Judgment and thought content normal.  LABORATORY RESULTS:  No results found for this or any previous visit (from the past 48 hour(s)).  RADIOLOGY RESULTS:  No results found.  Problem List:  There is no problem list on file for this patient.   Assessment & Plan:  Appears early left colon cancer. Had cardiac clearance by William Patel.  He and is friend came for preop visit and bowel prep given.  William B. Aubriee Szeto, MD, FACS  [facility]  [phone] beeper  [phone]  

## 2011-09-21 NOTE — Op Note (Signed)
Surgeon: Wenda Low, MD, FACS  Asst:  Nadara Eaton M.D. FACS  Anes:  General  Procedure: Lap assisted transverse colectomy with transverse tp sigmoid anastomosis (side to side stapled with handsewn closure) following splenic flexure mobilizastion  Diagnosis: Transverse colon cancer  Complications: None  EBL:   30 cc cc  Description of Procedure:  Patient was taken to room 1 and given general anesthesia. He was placed in yellowfins stirrups. Abdomen was prepped with PCMX and draped sterilely. Access to the abdomen was achieved through the right upper quadrant with a 0 5 mm Optiview technique. 3 other ports were placed 5 mm and I used these ports along with the harmonic scalpel to mobilize the splenic flexure in its entirety and bring it to the midline. The tattooed area was very visible and was in the mid transverse colon and not in the descending colon. Went ahead and gave [place] a several centimeter proximal margin and divided transverse colon and then pulled up the entire left colon down to the proximal sigmoid and that was mobile enough to allow me to resect. Mesentery was transected using Kelly clamps harmonic scalpel suture ligatures. An anchoring suture was placed distally and with openings made in each side of the colon and reduce the 7.5 mm GIA and creating that anastomosis. The common defect was closed reran with running 4-0 PDS from the outside layer with 3-0 silk. Patent anastomosis was seen. I did see a little bleeder in the crotch of this and oversewed with a figure-of-eight suture of 4-0 Vicryl prior to closing the colotomy. Good anastomosis was present. This was dropped back down to the abdomen. The fascia was closed with running number with 1 PDS double-stranded meter in the middle. There is irrigated next row was injected. Ports were injected with Exparel skin was closed with stapler. Patient are the procedure well and was taken recovery room in satisfactory condition  Matt B.  Daphine Deutscher, MD, [facility], [place] [phone]

## 2011-09-22 ENCOUNTER — Encounter (INDEPENDENT_AMBULATORY_CARE_PROVIDER_SITE_OTHER): Payer: Self-pay

## 2011-09-22 LAB — BASIC METABOLIC PANEL
Calcium: 7.8 mg/dL — ABNORMAL LOW (ref 8.4–10.5)
GFR calc non Af Amer: 71 mL/min — ABNORMAL LOW (ref >90)
Glucose, Bld: 185 mg/dL — ABNORMAL HIGH (ref 70–99)
Sodium: 132 mEq/L — ABNORMAL LOW (ref 135–145)

## 2011-09-22 LAB — CBC
MCH: 32.4 pg (ref 26.0–34.0)
Platelets: 101 10*3/uL — ABNORMAL LOW (ref 150–400)
RBC: 3.77 MIL/uL — ABNORMAL LOW (ref 4.22–5.81)
WBC: 5.8 10*3/uL (ref 4.0–10.5)

## 2011-09-22 MED ORDER — VITAMINS A & D EX OINT
TOPICAL_OINTMENT | CUTANEOUS | Status: AC
  Filled 2011-09-22: qty 5

## 2011-09-22 NOTE — Progress Notes (Signed)
Inpatient Diabetes Program Recommendations  AACE/ADA: New Consensus Statement on Inpatient Glycemic Control (2009)  Target Ranges:  Prepandial:   less than 140 mg/dL      Peak postprandial:   less than 180 mg/dL (1-2 hours)      Critically ill patients:  140 - 180 mg/dL   Reason for Visit: Elevated lab glucose Results for [name], [name] (MRN [id number]) as of 09/22/2011 16:27  Ref. Range 09/22/2011 03:40  Glucose Latest Range: 70-99 mg/dL 409 (H)    Note: Do not see where patient has a history of diabetes.  Please consider f/u lab glucose, CBG's, possible Hgb A1c etc to assure adequate glycemic control.  Thank you.

## 2011-09-22 NOTE — Progress Notes (Signed)
CARE MANAGE MENT UTILIZATION REVIEW NOTE 09/22/2011     Patient:  William Patel, William Patel   Account Number:  [id number]  Documented by:  WGNFAO Douglass Dunshee   Per Ur Regulation Reviewed for med. necessity/level of care/duration of stay

## 2011-09-22 NOTE — Progress Notes (Signed)
Patient ID: William Patel, male   DOB: 08-Aug-1933, 78 y.o.   MRN: [id number] [facility] Progress Note:   1 Day Post-Op  Subjective: Mental status is fairly clear Objective: Vital signs in last 24 hours: Temp:  [97.1 F (36.2 C)-99.3 F (37.4 C)] 97.6 F (36.4 C) (04/23 0400) Pulse Rate:  [58-82] 63  (04/23 0400) Resp:  [9-18] 13  (04/23 0400) BP: (126-164)/(74-98) 126/76 mmHg (04/23 0400) SpO2:  [97 %-100 %] 99 % (04/23 0400) Weight:  [136 lb 0.4 oz (61.7 kg)-137 lb 12.6 oz (62.5 kg)] 137 lb 12.6 oz (62.5 kg) (04/23 0000)  Intake/Output from previous day: 04/22 0701 - 04/23 0700 In: 5300 [I.V.:4800; IV Piggyback:500] Out: 1145 [Urine:1020; Blood:125] Intake/Output this shift:    Physical Exam: Work of breathing is  normal  Lab Results:  Results for orders placed during the hospital encounter of 09/21/11 (from the past 48 hour(s))  TYPE AND SCREEN     Status: Normal   Collection Time   09/21/11 12:57 PM      Component Value Range Comment   ABO/RH(D) O POS      Antibody Screen NEG      Sample Expiration 09/24/2011     ABO/RH     Status: Normal   Collection Time   09/21/11  1:00 PM      Component Value Range Comment   ABO/RH(D) O POS     CBC     Status: Abnormal   Collection Time   09/21/11  7:40 PM      Component Value Range Comment   WBC 5.1  4.0 - 10.5 (K/uL)    RBC 4.17 (*) 4.22 - 5.81 (MIL/uL)    Hemoglobin 13.4  13.0 - 17.0 (g/dL)    HCT 78.2  95.6 - 21.3 (%)    MCV 96.6  78.0 - 100.0 (fL)    MCH 32.1  26.0 - 34.0 (pg)    MCHC 33.3  30.0 - 36.0 (g/dL)    RDW 08.6  57.8 - 46.9 (%)    Platelets 122 (*) 150 - 400 (K/uL)   CREATININE, SERUM     Status: Abnormal   Collection Time   09/21/11  7:40 PM      Component Value Range Comment   Creatinine, Ser 1.06  0.50 - 1.35 (mg/dL)    GFR calc non Af Amer 66 (*) >90 (mL/min)    GFR calc Af Amer 77 (*) >90 (mL/min)   BASIC METABOLIC PANEL     Status: Abnormal   Collection Time   09/22/11  3:40 AM   Component Value Range Comment   Sodium 132 (*) 135 - 145 (mEq/L)    Potassium 4.3  3.5 - 5.1 (mEq/L)    Chloride 102  96 - 112 (mEq/L)    CO2 24  19 - 32 (mEq/L)    Glucose, Bld 185 (*) 70 - 99 (mg/dL)    BUN 22  6 - 23 (mg/dL)    Creatinine, Ser 6.29  0.50 - 1.35 (mg/dL)    Calcium 7.8 (*) 8.4 - 10.5 (mg/dL)    GFR calc non Af Amer 71 (*) >90 (mL/min)    GFR calc Af Amer 82 (*) >90 (mL/min)   CBC     Status: Abnormal   Collection Time   09/22/11  3:40 AM      Component Value Range Comment   WBC 5.8  4.0 - 10.5 (K/uL)    RBC 3.77 (*) 4.22 - 5.81 (  MIL/uL)    Hemoglobin 12.2 (*) 13.0 - 17.0 (g/dL)    HCT 65.7 (*) 84.6 - 52.0 (%)    MCV 95.0  78.0 - 100.0 (fL)    MCH 32.4  26.0 - 34.0 (pg)    MCHC 34.1  30.0 - 36.0 (g/dL)    RDW 96.2  95.2 - 84.1 (%)    Platelets 101 (*) 150 - 400 (K/uL)     Radiology/Results: No results found.  Anti-infectives: Anti-infectives     Start     Dose/Rate Route Frequency Ordered Stop   09/21/11 1240   ertapenem (INVANZ) 1 g in sodium chloride 0.9 % 50 mL IVPB        1 g 100 mL/hr over 30 Minutes Intravenous 60 min pre-op 09/21/11 1240 09/21/11 1647          Assessment/Plan: Problem List: Patient Active Problem List  Diagnoses  . Colon cancer-proximal descending left colon  . Tobacco use-chews  . PVC (premature ventricular contraction)  . Pre-operative cardiovascular examination    No pain in incision which was injected with Exparel.  No flatus.   Stable Will transfer to floor.  1 Day Post-Op    LOS: 1 day   Matt B. Daphine Deutscher, MD, [facility] [phone] beeper [phone]  09/22/2011 8:40 AM

## 2011-09-23 NOTE — Progress Notes (Signed)
Patient ID: William Patel, male   DOB: Mar 16, 1934, 78 y.o.   MRN: [id number] [facility] Progress Note:   2 Days Post-Op  Subjective: Mental status is clear Objective: Vital signs in last 24 hours: Temp:  [97.6 F (36.4 C)-98.3 F (36.8 C)] 97.6 F (36.4 C) (04/24 0618) Pulse Rate:  [56-69] 65  (04/24 0618) Resp:  [0-22] 18  (04/24 0618) BP: (104-121)/(64-69) 106/64 mmHg (04/24 0618) SpO2:  [97 %-100 %] 98 % (04/24 0618) Weight:  [137 lb 12.6 oz (62.5 kg)] 137 lb 12.6 oz (62.5 kg) (04/23 0900)  Intake/Output from previous day: 04/23 0701 - 04/24 0700 In: 2310 [I.V.:2300; IV Piggyback:10] Out: 3125 [Urine:3125] Intake/Output this shift:    Physical Exam: Work of breathing is  Normal.  Having a  Little more incisional pain.  No flatus yet.   Lab Results:  Results for orders placed during the hospital encounter of 09/21/11 (from the past 48 hour(s))  TYPE AND SCREEN     Status: Normal   Collection Time   09/21/11 12:57 PM      Component Value Range Comment   ABO/RH(D) O POS      Antibody Screen NEG      Sample Expiration 09/24/2011     ABO/RH     Status: Normal   Collection Time   09/21/11  1:00 PM      Component Value Range Comment   ABO/RH(D) O POS     CBC     Status: Abnormal   Collection Time   09/21/11  7:40 PM      Component Value Range Comment   WBC 5.1  4.0 - 10.5 (K/uL)    RBC 4.17 (*) 4.22 - 5.81 (MIL/uL)    Hemoglobin 13.4  13.0 - 17.0 (g/dL)    HCT 40.9  81.1 - 91.4 (%)    MCV 96.6  78.0 - 100.0 (fL)    MCH 32.1  26.0 - 34.0 (pg)    MCHC 33.3  30.0 - 36.0 (g/dL)    RDW 78.2  95.6 - 21.3 (%)    Platelets 122 (*) 150 - 400 (K/uL)   CREATININE, SERUM     Status: Abnormal   Collection Time   09/21/11  7:40 PM      Component Value Range Comment   Creatinine, Ser 1.06  0.50 - 1.35 (mg/dL)    GFR calc non Af Amer 66 (*) >90 (mL/min)    GFR calc Af Amer 77 (*) >90 (mL/min)   BASIC METABOLIC PANEL     Status: Abnormal   Collection Time   09/22/11   3:40 AM      Component Value Range Comment   Sodium 132 (*) 135 - 145 (mEq/L)    Potassium 4.3  3.5 - 5.1 (mEq/L)    Chloride 102  96 - 112 (mEq/L)    CO2 24  19 - 32 (mEq/L)    Glucose, Bld 185 (*) 70 - 99 (mg/dL)    BUN 22  6 - 23 (mg/dL)    Creatinine, Ser 0.86  0.50 - 1.35 (mg/dL)    Calcium 7.8 (*) 8.4 - 10.5 (mg/dL)    GFR calc non Af Amer 71 (*) >90 (mL/min)    GFR calc Af Amer 82 (*) >90 (mL/min)   CBC     Status: Abnormal   Collection Time   09/22/11  3:40 AM      Component Value Range Comment   WBC 5.8  4.0 - 10.5 (  K/uL)    RBC 3.77 (*) 4.22 - 5.81 (MIL/uL)    Hemoglobin 12.2 (*) 13.0 - 17.0 (g/dL)    HCT 16.1 (*) 09.6 - 52.0 (%)    MCV 95.0  78.0 - 100.0 (fL)    MCH 32.4  26.0 - 34.0 (pg)    MCHC 34.1  30.0 - 36.0 (g/dL)    RDW 04.5  40.9 - 81.1 (%)    Platelets 101 (*) 150 - 400 (K/uL)     Radiology/Results: No results found.  Anti-infectives: Anti-infectives     Start     Dose/Rate Route Frequency Ordered Stop   09/21/11 1240   ertapenem (INVANZ) 1 g in sodium chloride 0.9 % 50 mL IVPB        1 g 100 mL/hr over 30 Minutes Intravenous 60 min pre-op 09/21/11 1240 09/21/11 1647          Assessment/Plan: Problem List: Patient Active Problem List  Diagnoses  . Colon cancer-proximal descending left colon  . Tobacco use-chews  . PVC (premature ventricular contraction)  . Pre-operative cardiovascular examination    Dc foley cath.  Continue npo  2 Days Post-Op    LOS: 2 days   Matt B. Daphine Deutscher, MD, [facility] [phone] beeper [phone]  09/23/2011 8:11 AM

## 2011-09-23 NOTE — Progress Notes (Signed)
Patient is alert and oriented, vital signs are stable, pt with abd pain upon movement and ambulating and medicated with morphine .5 mg twice today, foley removed this morning and patient is voiding adequately, bowel sounds are hypoactive, drsg is clean dry and intact, pt is belching but no flatus at this moment, pt has walked around the nursingstation several times this shift, will continue to monitor Means, Myrtie Hawk RN 09-23-11 19:08pm

## 2011-09-24 NOTE — Progress Notes (Signed)
Patient ID: William Patel, male   DOB: 01-19-1934, 78 y.o.   MRN: [id number] [facility] Progress Note:   3 Days Post-Op  Subjective: Mental status is clear Objective: Vital signs in last 24 hours: Temp:  [97.9 F (36.6 C)-98.7 F (37.1 C)] 97.9 F (36.6 C) (04/25 0630) Pulse Rate:  [65-70] 70  (04/25 0630) Resp:  [18-20] 18  (04/25 0630) BP: (108-146)/(68-81) 108/68 mmHg (04/25 0630) SpO2:  [96 %-99 %] 96 % (04/25 0630)  Intake/Output from previous day: 04/24 0701 - 04/25 0700 In: 2100 [I.V.:2100] Out: 2525 [Urine:2525] Intake/Output this shift:    Physical Exam: Work of breathing is  Not labored.  incisons ok  No flatus  Lab Results:  No results found for this or any previous visit (from the past 48 hour(s)).  Radiology/Results: No results found.  Anti-infectives: Anti-infectives     Start     Dose/Rate Route Frequency Ordered Stop   09/21/11 1240   ertapenem (INVANZ) 1 g in sodium chloride 0.9 % 50 mL IVPB        1 g 100 mL/hr over 30 Minutes Intravenous 60 min pre-op 09/21/11 1240 09/21/11 1647          Assessment/Plan: Problem List: Patient Active Problem List  Diagnoses  . Colon cancer-proximal descending left colon  . Tobacco use-chews  . PVC (premature ventricular contraction)  . Pre-operative cardiovascular examination    Feels like flatus coming.  Continue npo 3 Days Post-Op    LOS: 3 days   Matt B. Daphine Deutscher, MD, [facility] [phone] beeper [phone]  09/24/2011 8:52 AM

## 2011-09-25 NOTE — Progress Notes (Signed)
Patient ID: William Patel, male   DOB: August 16, 1933, 78 y.o.   MRN: [id number] [facility] Progress Note:   4 Days Post-Op  Subjective: Mental status is clear;  No complaints.  Passed flatus last night Objective: Vital signs in last 24 hours: Temp:  [97.9 F (36.6 C)-98.4 F (36.9 C)] 97.9 F (36.6 C) (04/26 0603) Pulse Rate:  [60-69] 60  (04/26 0603) Resp:  [16-18] 18  (04/26 0603) BP: (114-136)/(76-83) 136/83 mmHg (04/26 0603) SpO2:  [97 %-98 %] 97 % (04/26 0603)  Intake/Output from previous day: 04/25 0701 - 04/26 0700 In: -  Out: 2075 [Urine:2075] Intake/Output this shift: Total I/O In: -  Out: 350 [Urine:350]  Physical Exam: Work of breathing is  Normal.  nontender  Lab Results:  No results found for this or any previous visit (from the past 48 hour(s)).  Radiology/Results: No results found.  Anti-infectives: Anti-infectives     Start     Dose/Rate Route Frequency Ordered Stop   09/21/11 1240   ertapenem (INVANZ) 1 g in sodium chloride 0.9 % 50 mL IVPB        1 g 100 mL/hr over 30 Minutes Intravenous 60 min pre-op 09/21/11 1240 09/21/11 1647          Assessment/Plan: Problem List: Patient Active Problem List  Diagnoses  . Colon cancer-proximal descending left colon  . Tobacco use-chews  . PVC (premature ventricular contraction)  . Pre-operative cardiovascular examination    Doing well.  Start clears today.  Path T2 N0 cancer of colon 4 Days Post-Op    LOS: 4 days   Matt B. Daphine Deutscher, MD, [facility] [phone] beeper [phone]  09/25/2011 10:15 AM

## 2011-09-26 MED ORDER — PANTOPRAZOLE SODIUM 40 MG PO TBEC
40.0000 mg | DELAYED_RELEASE_TABLET | Freq: Every day | ORAL | Status: DC
  Administered 2011-09-26 – 2011-09-27 (×2): 40 mg via ORAL
  Filled 2011-09-26 (×2): qty 1

## 2011-09-26 MED ORDER — HYDROCODONE-ACETAMINOPHEN 10-325 MG PO TABS
2.0000 | ORAL_TABLET | ORAL | Status: DC | PRN
  Administered 2011-09-26 – 2011-09-27 (×2): 2 via ORAL
  Filled 2011-09-26: qty 1
  Filled 2011-09-26: qty 2
  Filled 2011-09-26: qty 1

## 2011-09-26 NOTE — Progress Notes (Signed)
Changed dressing. ?

## 2011-09-26 NOTE — Progress Notes (Signed)
5 Days Post-Op  Subjective: Making progress. Ambulating in the hall independently. Had a small bowel movement. Tolerating clear liquids.  Objective: Vital signs in last 24 hours: Temp:  [97.6 F (36.4 C)-98.4 F (36.9 C)] 98.1 F (36.7 C) (04/27 0440) Pulse Rate:  [51-76] 64  (04/27 0440) Resp:  [18] 18  (04/27 0440) BP: (110-144)/(70-91) 116/77 mmHg (04/27 0440) SpO2:  [98 %-99 %] 98 % (04/27 0440) Last BM Date: 09/25/11  Intake/Output from previous day: 04/26 0701 - 04/27 0700 In: 3611.7 [I.V.:3611.7] Out: 1725 [Urine:1725] Intake/Output this shift:    General appearance: alert. No status normal. No distress. Abdomen and a lady in home by himself. Resp: clear to auscultation bilaterally GI: soft. Active bowel sounds. Wounds clean. Minimally distended.  Lab Results:  No results found for this or any previous visit (from the past 24 hour(s)).   Studies/Results: @RISRSLT24 @     . heparin  5,000 Units Subcutaneous Q8H  . nicotine  7 mg Transdermal Daily  . pantoprazole (PROTONIX) IV  40 mg Intravenous QHS     Assessment/Plan: s/p Procedure(s): COLON RESECTION LAPAROSCOPIC  POD #5. Ileus resolving. Full liquid diet. Transition to hydrocodone analgesic Home in 24-48 hours.   Patient Active Hospital Problem List: No active hospital problems.   LOS: 5 days    Danaija Eskridge M. Derrell Lolling, M.D., [facility] General and Minimally invasive Surgery Breast and Colorectal Surgery Office:   [phone] Pager:   [phone]  09/26/2011  . .prob

## 2011-09-27 MED ORDER — HYDROCODONE-ACETAMINOPHEN 10-325 MG PO TABS: 1.0000 | ORAL_TABLET | Freq: Four times a day (QID) | ORAL | Status: AC | PRN

## 2011-09-27 NOTE — Discharge Summary (Signed)
Physician Discharge Summary  Patient ID: William Patel MRN: [id number] DOB/AGE: 1933-11-02 77 y.o.  Admit date: 09/21/2011 Discharge date: 09/27/2011  Admission Diagnoses:  Cancer of the colon   Discharge Diagnoses:  Cancer of the distal transverse colon, T2N0  Active Problems:  * No active hospital problems. *    Surgery:  Lap assisted partial colectomy   Discharged Condition: improved  Hospital Course:   Had surgery.  Ileus resolved.  Ready for discharge home.  Consults: none  Significant Diagnostic Studies: none    Discharge Exam: Blood pressure 109/69, pulse 74, temperature 97.6 F (36.4 C), temperature source Oral, resp. rate 16, height 5\' 8"  (1.727 m), weight 137 lb 12.6 oz (62.5 kg), SpO2 97.00%. Incisions bland.  Staples out and steristrips applied.   Disposition:   Discharge Orders    Future Appointments: Provider: Department: Dept Phone: Center:   10/16/2011 2:20 PM Valarie Merino, MD [facility] [phone] None     Future Orders Please Complete By Expires   Diet - low sodium heart healthy      Increase activity slowly      Discharge instructions      Comments:   Shower as needed   Remove staples      Comments:   Remove staples and apply steri strips     Medication List  As of 09/27/2011 10:01 AM   TAKE these medications         HYDROcodone-acetaminophen 10-325 MG per tablet   Commonly known as: NORCO   Take 1 tablet by mouth every 6 (six) hours as needed.      ibuprofen 200 MG tablet   Commonly known as: ADVIL,MOTRIN   Take 400 mg by mouth every 6 (six) hours as needed. For pain      loratadine 10 MG tablet   Commonly known as: CLARITIN   Take 10 mg by mouth as needed.      mulitivitamin with minerals Tabs   Take 1 tablet by mouth daily with breakfast.           Follow-up Information    Follow up with Luretha Murphy B, MD. Schedule an appointment as soon as possible for a visit in 3 weeks.   Contact information:   [facility] [employer] [address] [place] [address] [phone]          Signed: Valarie Merino 09/27/2011, 10:01 AM

## 2011-09-27 NOTE — Discharge Instructions (Signed)
Cancer of the Colon, Treatment by Resection You and your caregiver have decided that surgical removal of your colon cancer is the best form of treatment for you. Your surgeon or surgeons will do their best to remove your entire tumor. To do this, some normal tissue must also be removed to give you the best chance for a cure. The following will help describe what happens when you have this surgery. TREATMENT  Surgery is the most common treatment for colorectal cancer. It is a type of local therapy. It treats the cancer in the colon or rectum and the area close to the tumor by removing the tumor and some of the healthy tissue around it. For larger cancers, your surgeon must make an cut (incision) into the belly (abdomen) so he or she can see the area of the tumor and remove it as well as part of the healthy colon or rectum. Some nearby lymph nodes also may be removed. The surgeon checks the rest of the abdomen, the intestine and the liver to see if the cancer has spread. When a section of the colon or rectum is removed, the surgeon can usually reconnect the healthy parts. However, sometimes reconnection is not possible. In this case, the surgeon creates a new path for waste to leave the body. The surgeon makes an opening (a stoma) in the wall of the abdomen. The upper end of the intestine is then connected to the stoma. The other end is closed. The operation to create the stoma is called a colostomy. A flat bag fits over the stoma to collect waste, and a special adhesive holds it in place.  Some colostomies are temporary. The colostomy is needed only until the colon or rectum heals from surgery. After healing takes place, the surgeon reconnects the parts of the intestine and closes the stoma. Other patients need a permanent colostomy.  ASK YOUR CAREGIVER THESE QUESTIONS BEFORE HAVING SURGERY:  What kind of operation do you recommend for me?   Do I need any lymph nodes removed? Will other tissues be removed?  Why?   What are the risks of surgery? Will I have any lasting side effects?   Will I need a colostomy? If so, will it be permanent?   How will I feel after the operation?   If I have pain, how will it be controlled?   How long will I be in the hospital?   When can I get back to my normal activities?  FOLLOW-UP CARE  Follow-up care after treatment for colorectal cancer is important. Even when the cancer seems to have been completely removed or destroyed, the disease sometimes returns. Undetected cancer cells may still remain somewhere in the body after treatment. The doctor keeps checking the person's recovery and checks for recurrence of the cancer. Recurrence means that the cancer comes back.  Checkups help make sure that changes in health are found. Checkups may include:  A physical exam (including a digital rectal exam). This means your caregiver checks you to see if there are any abnormal changes they can see or feel.   Lab tests (including fecal occult blood test and CEA test) may be done. The "fecal occult blood test" checks for blood in the stool. The CEA (carcinoembryonic antigen) is a blood test that looks for a marker of colon cancer in the blood.   A colonoscopy is a test where your caregiver examines your colon with a flexible instrument like a thin telescope which looks at the inside   of the large bowel.   Other specialized x-rays, CT scans, or other tests may be performed.  Between scheduled visits you should contact your caregivers as soon as any health problems appear. Document Released: 05/21/2003 Document Revised: 05/07/2011 Document Reviewed: 09/13/2007 [employer] Patient Information 2012 [employer]. 

## 2011-09-27 NOTE — Progress Notes (Signed)
Assessment unchanged. Pt and family verbalized understanding of dc instructions. Script x1 given as provided by MD. Discharged via wc to front entrance to meet awaiting vehicle to carry home. Accompanied by family and nurse.

## 2011-09-30 ENCOUNTER — Encounter (HOSPITAL_COMMUNITY): Payer: Self-pay | Admitting: Surgery

## 2011-10-16 ENCOUNTER — Ambulatory Visit (INDEPENDENT_AMBULATORY_CARE_PROVIDER_SITE_OTHER): Payer: Medicare Other | Admitting: Surgery

## 2011-10-16 ENCOUNTER — Encounter (INDEPENDENT_AMBULATORY_CARE_PROVIDER_SITE_OTHER): Payer: Self-pay | Admitting: Surgery

## 2011-10-16 VITALS — BP 122/70 | HR 79 | Temp 97.8°F | Ht 61.75 in | Wt 127.6 lb

## 2011-10-16 DIAGNOSIS — C189 Malignant neoplasm of colon, unspecified: Secondary | ICD-10-CM

## 2011-10-16 NOTE — Progress Notes (Signed)
William Patel 78 y.o.  Body mass index is 23.53 kg/(m^2).  Patient Active Problem List  Diagnoses  . Colon cancer-proximal descending left colon  . Tobacco use-chews  . PVC (premature ventricular contraction)  . Pre-operative cardiovascular examination    Allergies  Allergen Reactions  . Tramadol Other (See Comments)    Causes headaches    Past Surgical History  Procedure Date  . Colonoscopy feb 2013    endo done also  . Colon resection 09/21/2011    Procedure: COLON RESECTION LAPAROSCOPIC;  Surgeon: Valarie Merino, MD;  Location: [facility];  Service: General;  Laterality: N/A;      William Mask, MD, MD No diagnosis found.  William Patel looks good. His incision is healed nicely. His bowels are working regularly and so far I think he's doing very well. He is almost one month out from his colectomy. I plan to see him again in 2 months make sure his incision is healed fine. Path report:  INVASIVE WELL DIFFERENTIATED ADENOCARCINOMA INVADING INTO BUT NOT THROUGH MUSCULARIS PROPRIA. - NO EVIDENCE OF ANGIOLYMPHATIC INVASION IDENTIFIED. - THIRTEEN LYMPH NODES, NEGATIVE FOR METASTATIC CARCINOMA (0/13). - RESECTION MARGINS, NEGATIVE FOR ATYPIA OR MALIGNANCY.  Return 2 months William B. Daphine Deutscher, MD, [facility] [phone] beeper [phone]  10/16/2011 3:23 PM

## 2011-12-17 ENCOUNTER — Encounter (INDEPENDENT_AMBULATORY_CARE_PROVIDER_SITE_OTHER): Payer: Self-pay

## 2011-12-25 ENCOUNTER — Encounter (INDEPENDENT_AMBULATORY_CARE_PROVIDER_SITE_OTHER): Payer: Medicare Other | Admitting: Surgery

## 2012-06-13 ENCOUNTER — Encounter (HOSPITAL_COMMUNITY): Payer: Self-pay | Admitting: Emergency Medicine

## 2012-06-13 ENCOUNTER — Observation Stay (HOSPITAL_COMMUNITY)
Admission: EM | Admit: 2012-06-13 | Discharge: 2012-06-15 | Disposition: A | Discharge status: Home / Self Care | Payer: Medicare Other | Attending: Internal Medicine | Admitting: Internal Medicine

## 2012-06-13 ENCOUNTER — Emergency Department (HOSPITAL_COMMUNITY): Payer: Medicare Other

## 2012-06-13 DIAGNOSIS — R55 Syncope and collapse: Secondary | ICD-10-CM | POA: Diagnosis present

## 2012-06-13 DIAGNOSIS — F17200 Nicotine dependence, unspecified, uncomplicated: Secondary | ICD-10-CM | POA: Insufficient documentation

## 2012-06-13 DIAGNOSIS — Z23 Encounter for immunization: Secondary | ICD-10-CM | POA: Insufficient documentation

## 2012-06-13 DIAGNOSIS — K5730 Diverticulosis of large intestine without perforation or abscess without bleeding: Secondary | ICD-10-CM | POA: Insufficient documentation

## 2012-06-13 DIAGNOSIS — R0789 Other chest pain: Principal | ICD-10-CM | POA: Diagnosis present

## 2012-06-13 DIAGNOSIS — R42 Dizziness and giddiness: Secondary | ICD-10-CM | POA: Diagnosis present

## 2012-06-13 DIAGNOSIS — R109 Unspecified abdominal pain: Secondary | ICD-10-CM

## 2012-06-13 DIAGNOSIS — C189 Malignant neoplasm of colon, unspecified: Secondary | ICD-10-CM | POA: Diagnosis present

## 2012-06-13 DIAGNOSIS — R079 Chest pain, unspecified: Secondary | ICD-10-CM

## 2012-06-13 DIAGNOSIS — Z79899 Other long term (current) drug therapy: Secondary | ICD-10-CM | POA: Insufficient documentation

## 2012-06-13 DIAGNOSIS — I493 Ventricular premature depolarization: Secondary | ICD-10-CM

## 2012-06-13 DIAGNOSIS — Z85038 Personal history of other malignant neoplasm of large intestine: Secondary | ICD-10-CM | POA: Insufficient documentation

## 2012-06-13 DIAGNOSIS — Z01810 Encounter for preprocedural cardiovascular examination: Secondary | ICD-10-CM

## 2012-06-13 DIAGNOSIS — Z720 Tobacco use: Secondary | ICD-10-CM | POA: Diagnosis present

## 2012-06-13 DIAGNOSIS — K5732 Diverticulitis of large intestine without perforation or abscess without bleeding: Secondary | ICD-10-CM

## 2012-06-13 LAB — POCT I-STAT TROPONIN I

## 2012-06-13 LAB — COMPREHENSIVE METABOLIC PANEL
AST: 16 U/L (ref 0–37)
Albumin: 3.7 g/dL (ref 3.5–5.2)
Alkaline Phosphatase: 55 U/L (ref 39–117)
BUN: 15 mg/dL (ref 6–23)
Chloride: 103 mEq/L (ref 96–112)
Potassium: 3.9 mEq/L (ref 3.5–5.1)
Sodium: 141 mEq/L (ref 135–145)
Total Protein: 6.5 g/dL (ref 6.0–8.3)

## 2012-06-13 LAB — CBC
HCT: 40.4 % (ref 39.0–52.0)
MCHC: 34.9 g/dL (ref 30.0–36.0)
Platelets: 135 10*3/uL — ABNORMAL LOW (ref 150–400)
RDW: 12.4 % (ref 11.5–15.5)
WBC: 5.4 10*3/uL (ref 4.0–10.5)

## 2012-06-13 LAB — URINALYSIS, ROUTINE W REFLEX MICROSCOPIC
Glucose, UA: NEGATIVE mg/dL
Hgb urine dipstick: NEGATIVE
Ketones, ur: NEGATIVE mg/dL
Leukocytes, UA: NEGATIVE
pH: 6.5 (ref 5.0–8.0)

## 2012-06-13 MED ORDER — SODIUM CHLORIDE 0.9 % IV SOLN
20.0000 mL | INTRAVENOUS | Status: DC
  Administered 2012-06-13: 1000 mL via INTRAVENOUS

## 2012-06-13 MED ORDER — NITROGLYCERIN 2 % TD OINT
1.0000 [in_us] | TOPICAL_OINTMENT | Freq: Once | TRANSDERMAL | Status: AC
  Administered 2012-06-13: 1 [in_us] via TOPICAL
  Filled 2012-06-13: qty 1

## 2012-06-13 NOTE — ED Notes (Signed)
Patient with chest pain that started approximately 3 hours ago, with shortness of breath, no nausea.  Patient was outside working when pain started.  Patient took 2 325mg  ASA prior to EMS arrival, given 2 nitro enroute by EMS with relief of pain, now pain is 1/10.

## 2012-06-13 NOTE — ED Provider Notes (Signed)
History     CSN: [id number]  Arrival date & time 06/13/12  2043   First MD Initiated Contact with Patient 06/13/12 2047      Chief Complaint  Patient presents with  . Chest Pain    (Consider location/radiation/quality/duration/timing/severity/associated sxs/prior treatment) HPI Comments: William Patel is a 79 y.o. Male who developed chest pain, with shortness of breath after lifting wood today. This was heavy exertion. The pain was 8/10, at the worst. He took a full-strength aspirin at home prior to EMS arrival. EMS gave him 2 nitroglycerin pills, and his pain went to 0/10. He has never had this previously. He's been having trouble sleeping for a long time. He complains of mild, generalized weakness, an ongoing problem. He has not seen his doctor recently. He uses meclizine for dizziness. He has no cardiac history. There no other modifying factors.  Patient is a 79 y.o. male presenting with chest pain. The history is provided by the patient.  Chest Pain     Past Medical History  Diagnosis Date  . Vertigo   . Visual disturbances   . Leg swelling occasional    both legs  . Rectal bleeding     small amount, none current  . Hematuria   . Colon cancer     Left   . Generalized headaches     Past Surgical History  Procedure Date  . Colonoscopy feb 2013    endo done also  . Colon resection 09/21/2011    Procedure: COLON RESECTION LAPAROSCOPIC;  Surgeon: Valarie Merino, MD;  Location: [facility];  Service: General;  Laterality: N/A;       Family History  Problem Relation Age of Onset  . Heart failure Mother   . Brain cancer Mother   . Brain cancer Father     History  Substance Use Topics  . Smoking status: Current Some Day Smoker    Types: Cigars  . Smokeless tobacco: Current User    Types: Chew  . Alcohol Use: 1.8 oz/week    3 Cans of beer per week      Review of Systems  Cardiovascular: Positive for chest pain.  All other systems reviewed and are  negative.    Allergies  Tramadol  Home Medications   Current Outpatient Rx  Name  Route  Sig  Dispense  Refill  . DIPHENHYDRAMINE HCL 25 MG PO CAPS   Oral   Take 25 mg by mouth every 6 (six) hours as needed. For allergy         . MECLIZINE HCL 25 MG PO TABS   Oral   Take 25 mg by mouth 3 (three) times daily as needed. For vertigo           BP 109/70  Pulse 63  Temp 97.6 F (36.4 C)  Resp 17  SpO2 100%  Physical Exam  Nursing note and vitals reviewed. Constitutional: He is oriented to person, place, and time. He appears well-developed.       Elderly, frail  HENT:  Head: Normocephalic and atraumatic.  Right Ear: External ear normal.  Left Ear: External ear normal.  Eyes: Conjunctivae normal and EOM are normal. Pupils are equal, round, and reactive to light.  Neck: Normal range of motion and phonation normal. Neck supple.  Cardiovascular: Normal rate, regular rhythm, normal heart sounds and intact distal pulses.   Pulmonary/Chest: Effort normal and breath sounds normal. He exhibits no bony tenderness.  Abdominal: Soft. Normal appearance. There is no  tenderness.  Musculoskeletal: Normal range of motion.  Neurological: He is alert and oriented to person, place, and time. He has normal strength. No cranial nerve deficit or sensory deficit. He exhibits normal muscle tone. Coordination normal.  Skin: Skin is warm, dry and intact.  Psychiatric: He has a normal mood and affect. His behavior is normal. Judgment and thought content normal.    ED Course  Procedures (including critical care time)   Emergency department treatment: Nitroglycerin ointment, to  Reevaluation: 0025- no recurrence of chest pain     Date: 03/18/2012  Rate: 67  Rhythm: normal sinus rhythm  QRS Axis: normal  PR and QT Intervals: normal  ST/T Wave abnormalities: normal  PR and QRS Conduction Disutrbances:none  Narrative Interpretation:   Old EKG Reviewed: unchanged   CRITICAL  CARE Performed by: Mancel Bale L   Total critical care time: 35 minutes  Critical care time was exclusive of separately billable procedures and treating other patients.  Critical care was necessary to treat or prevent imminent or life-threatening deterioration.  Critical care was time spent personally by me on the following activities: development of treatment plan with patient and/or surrogate as well as nursing, discussions with consultants, evaluation of patient's response to treatment, examination of patient, obtaining history from patient or surrogate, ordering and performing treatments and interventions, ordering and review of laboratory studies, ordering and review of radiographic studies, pulse oximetry and re-evaluation of patient's condition.   Nursing notes, applicable records and vitals reviewed.  Radiologic Images/Reports reviewed.   Labs Reviewed  CBC - Abnormal; Notable for the following:    Platelets 135 (*)     All other components within normal limits  COMPREHENSIVE METABOLIC PANEL - Abnormal; Notable for the following:    GFR calc non Af Amer 70 (*)     GFR calc Af Amer 81 (*)     All other components within normal limits  URINALYSIS, ROUTINE W REFLEX MICROSCOPIC - Abnormal; Notable for the following:    Specific Gravity, Urine 1.004 (*)     All other components within normal limits  POCT I-STAT TROPONIN I   Dg Chest Portable 1 View  06/13/2012  *RADIOLOGY REPORT*  Clinical Data: Chest pain  PORTABLE CHEST - 1 VIEW  Comparison: 02/06/2010  Findings: The heart size and pulmonary vascularity are normal. The lungs appear clear and expanded without focal air space disease or consolidation. No blunting of the costophrenic angles.  No pneumothorax.  Mediastinal contours appear intact.  No significant changes since the previous study.  IMPRESSION: No evidence of active pulmonary disease.   Original Report Authenticated By: Burman Nieves, M.D.      1. Chest pain        MDM  Chest pain, and elderly, male, that improved, with nitroglycerin. He has not had recurrence of the discomfort wle on nitroglycerin ointment. The patient is to be admitted for further evaluation, treatment, and scheduled for risk stratification study for cardiac disease        Flint Melter, MD 06/14/12 [phone]

## 2012-06-13 NOTE — ED Notes (Signed)
X-ray at bedside

## 2012-06-14 ENCOUNTER — Encounter (HOSPITAL_COMMUNITY): Payer: Self-pay

## 2012-06-14 ENCOUNTER — Observation Stay (HOSPITAL_COMMUNITY): Payer: Medicare Other

## 2012-06-14 DIAGNOSIS — R42 Dizziness and giddiness: Secondary | ICD-10-CM | POA: Diagnosis present

## 2012-06-14 DIAGNOSIS — F17200 Nicotine dependence, unspecified, uncomplicated: Secondary | ICD-10-CM

## 2012-06-14 DIAGNOSIS — C189 Malignant neoplasm of colon, unspecified: Secondary | ICD-10-CM

## 2012-06-14 DIAGNOSIS — R55 Syncope and collapse: Secondary | ICD-10-CM

## 2012-06-14 DIAGNOSIS — R0789 Other chest pain: Secondary | ICD-10-CM | POA: Diagnosis present

## 2012-06-14 DIAGNOSIS — R6889 Other general symptoms and signs: Secondary | ICD-10-CM

## 2012-06-14 DIAGNOSIS — R072 Precordial pain: Secondary | ICD-10-CM

## 2012-06-14 DIAGNOSIS — R079 Chest pain, unspecified: Secondary | ICD-10-CM

## 2012-06-14 DIAGNOSIS — K5732 Diverticulitis of large intestine without perforation or abscess without bleeding: Secondary | ICD-10-CM

## 2012-06-14 DIAGNOSIS — R109 Unspecified abdominal pain: Secondary | ICD-10-CM

## 2012-06-14 LAB — BASIC METABOLIC PANEL
CO2: 25 mEq/L (ref 19–32)
Calcium: 8.6 mg/dL (ref 8.4–10.5)
Chloride: 103 mEq/L (ref 96–112)
Potassium: 3.7 mEq/L (ref 3.5–5.1)
Sodium: 139 mEq/L (ref 135–145)

## 2012-06-14 LAB — CBC
Hemoglobin: 13.1 g/dL (ref 13.0–17.0)
MCV: 93.9 fL (ref 78.0–100.0)
Platelets: 122 10*3/uL — ABNORMAL LOW (ref 150–400)
RBC: 4.11 MIL/uL — ABNORMAL LOW (ref 4.22–5.81)
WBC: 5.8 10*3/uL (ref 4.0–10.5)

## 2012-06-14 LAB — CEA: CEA: 0.6 ng/mL (ref 0.0–5.0)

## 2012-06-14 LAB — CK TOTAL AND CKMB (NOT AT ARMC)
CK, MB: 2.7 ng/mL (ref 0.3–4.0)
CK, MB: 2.5 ng/mL (ref 0.3–4.0)
Relative Index: INVALID (ref 0.0–2.5)
Relative Index: INVALID (ref 0.0–2.5)
Total CK: 45 U/L (ref 7–232)

## 2012-06-14 LAB — TROPONIN I
Troponin I: <0.3 ng/mL (ref <0.30)
Troponin I: <0.3 ng/mL (ref <0.30)

## 2012-06-14 MED ORDER — ACETAMINOPHEN 650 MG RE SUPP: 650.0000 mg | Freq: Four times a day (QID) | RECTAL | Status: DC | PRN

## 2012-06-14 MED ORDER — IOHEXOL 300 MG/ML  SOLN
25.0000 mL | INTRAMUSCULAR | Status: AC
  Administered 2012-06-14 (×2): 25 mL via ORAL

## 2012-06-14 MED ORDER — HEPARIN SODIUM (PORCINE) 5000 UNIT/ML IJ SOLN
5000.0000 [IU] | Freq: Three times a day (TID) | INTRAMUSCULAR | Status: DC
  Administered 2012-06-14 – 2012-06-15 (×5): 5000 [IU] via SUBCUTANEOUS
  Filled 2012-06-14 (×7): qty 1

## 2012-06-14 MED ORDER — ACETAMINOPHEN 325 MG PO TABS
650.0000 mg | ORAL_TABLET | Freq: Four times a day (QID) | ORAL | Status: DC | PRN
  Administered 2012-06-14: 650 mg via ORAL
  Filled 2012-06-14: qty 2

## 2012-06-14 MED ORDER — DIPHENHYDRAMINE HCL 25 MG PO CAPS: 25.0000 mg | ORAL_CAPSULE | Freq: Four times a day (QID) | ORAL | Status: DC | PRN

## 2012-06-14 MED ORDER — IOHEXOL 300 MG/ML  SOLN
100.0000 mL | Freq: Once | INTRAMUSCULAR | Status: AC | PRN
  Administered 2012-06-14: 100 mL via INTRAVENOUS

## 2012-06-14 MED ORDER — ASPIRIN EC 325 MG PO TBEC
325.0000 mg | DELAYED_RELEASE_TABLET | Freq: Every day | ORAL | Status: DC
  Administered 2012-06-14 – 2012-06-15 (×2): 325 mg via ORAL
  Filled 2012-06-14 (×2): qty 1

## 2012-06-14 MED ORDER — ALUM & MAG HYDROXIDE-SIMETH 200-200-20 MG/5ML PO SUSP: 30.0000 mL | Freq: Four times a day (QID) | ORAL | Status: DC | PRN

## 2012-06-14 MED ORDER — ONDANSETRON HCL 4 MG/2ML IJ SOLN: 4.0000 mg | Freq: Four times a day (QID) | INTRAMUSCULAR | Status: DC | PRN

## 2012-06-14 MED ORDER — HYDROMORPHONE HCL PF 1 MG/ML IJ SOLN
1.0000 mg | INTRAMUSCULAR | Status: DC | PRN
  Filled 2012-06-14: qty 1

## 2012-06-14 MED ORDER — MECLIZINE HCL 25 MG PO TABS: 25.0000 mg | ORAL_TABLET | Freq: Three times a day (TID) | ORAL | Status: DC | PRN

## 2012-06-14 MED ORDER — SODIUM CHLORIDE 0.9 % IV SOLN
INTRAVENOUS | Status: DC
  Administered 2012-06-14: 16:00:00 via INTRAVENOUS
  Administered 2012-06-14: 75 mL/h via INTRAVENOUS
  Administered 2012-06-15: 06:00:00 via INTRAVENOUS

## 2012-06-14 MED ORDER — PANTOPRAZOLE SODIUM 40 MG PO TBEC
40.0000 mg | DELAYED_RELEASE_TABLET | Freq: Every day | ORAL | Status: DC
  Administered 2012-06-14 – 2012-06-15 (×2): 40 mg via ORAL
  Filled 2012-06-14 (×2): qty 1

## 2012-06-14 MED ORDER — SODIUM CHLORIDE 0.9 % IJ SOLN
3.0000 mL | Freq: Two times a day (BID) | INTRAMUSCULAR | Status: DC
  Administered 2012-06-14: 3 mL via INTRAVENOUS

## 2012-06-14 MED ORDER — HYDROCODONE-ACETAMINOPHEN 5-325 MG PO TABS: 1.0000 | ORAL_TABLET | ORAL | Status: DC | PRN

## 2012-06-14 MED ORDER — NITROGLYCERIN 0.2 MG/HR TD PT24
0.2000 mg | MEDICATED_PATCH | Freq: Every day | TRANSDERMAL | Status: DC
  Administered 2012-06-14 – 2012-06-15 (×2): 0.2 mg via TRANSDERMAL
  Filled 2012-06-14 (×2): qty 1

## 2012-06-14 MED ORDER — PIPERACILLIN-TAZOBACTAM 3.375 G IVPB
3.3750 g | Freq: Three times a day (TID) | INTRAVENOUS | Status: DC
  Administered 2012-06-14 – 2012-06-15 (×4): 3.375 g via INTRAVENOUS
  Filled 2012-06-14 (×6): qty 50

## 2012-06-14 MED ORDER — INFLUENZA VIRUS VACC SPLIT PF IM SUSP
0.5000 mL | INTRAMUSCULAR | Status: AC
  Administered 2012-06-15: 0.5 mL via INTRAMUSCULAR
  Filled 2012-06-14: qty 0.5

## 2012-06-14 MED ORDER — ONDANSETRON HCL 4 MG PO TABS: 4.0000 mg | ORAL_TABLET | Freq: Four times a day (QID) | ORAL | Status: DC | PRN

## 2012-06-14 NOTE — Progress Notes (Signed)
Notified pt having bigeminy PAC's. Pt asymptomatic, VSS. Will continue to monitor pt closely.

## 2012-06-14 NOTE — Progress Notes (Signed)
Pt arrived to unit, comfortable, resting. VSS. Will continue to monitor.

## 2012-06-14 NOTE — Progress Notes (Signed)
[facility] PROGRESS NOTE  William Patel NWG:[id number] DOB: 1933/08/12 DOA: 06/13/2012 PCP: Kaleen Mask, MD  Assessment/Plan: 1. Chest pain on exertion: stable angina: cardiac enzymes are negative. Echo ordered. Cardiology consulted. ekg does show any ischemic changes. Plan for possible stress in the future.   2. abd cramps; ct abd showed mild diverticulitis. Started him on zosyn and clear liquid diet.   Vertigo: stable.   DVT prophylaxis  Code Status: full code Family Communication: none at bedside Disposition Plan: pending.    Consultants: cardiology    HPI/Subjective: abd cramps occasionally.   Objective: Filed Vitals:   06/14/12 0045 06/14/12 0100 06/14/12 0125 06/14/12 0544  BP: 115/74 112/77 126/73 100/68  Pulse: 63 66 65 69  Temp:   98 F (36.7 C) 97.6 F (36.4 C)  TempSrc:   Oral Oral  Resp: 21 19 19 18   Height:   5' 1.25" (1.556 m)   Weight:   58.514 kg (129 lb)   SpO2: 100% 100% 99% 98%    Intake/Output Summary (Last 24 hours) at 06/14/12 1455 Last data filed at 06/14/12 0800  Gross per 24 hour  Intake      3 ml  Output    400 ml  Net   -397 ml   Filed Weights   06/14/12 0125  Weight: 58.514 kg (129 lb)    Exam:   General:  Alert comfortable, sitting on the bed  Cardiovascular: s1s2  Respiratory: CTAB  Abdomen: soft MILDLY tender in the lower quadrant.   Data Reviewed: Basic Metabolic Panel:  Lab 06/14/12 5784 06/13/12 2124  NA 139 141  K 3.7 3.9  CL 103 103  CO2 25 24  GLUCOSE 86 82  BUN 17 15  CREATININE 0.95 1.01  CALCIUM 8.6 9.0  MG -- --  PHOS -- --   Liver Function Tests:  Lab 06/13/12 2124  AST 16  ALT 10  ALKPHOS 55  BILITOT 0.3  PROT 6.5  ALBUMIN 3.7   No results found for this basename: LIPASE:5,AMYLASE:5 in the last 168 hours No results found for this basename: AMMONIA:5 in the last 168 hours CBC:  Lab 06/14/12 0801 06/13/12 2124  WBC 5.8 5.4  NEUTROABS -- --  HGB 13.1 14.1  HCT  38.6* 40.4  MCV 93.9 93.1  PLT 122* 135*   Cardiac Enzymes:  Lab 06/14/12 0801 06/14/12 0129  CKTOTAL 42 45  CKMB 2.5 2.7  CKMBINDEX -- --  TROPONINI <0.30 <0.30   BNP (last 3 results) No results found for this basename: PROBNP:3 in the last 8760 hours CBG: No results found for this basename: GLUCAP:5 in the last 168 hours  No results found for this or any previous visit (from the past 240 hour(s)).   Studies: Ct Abdomen Pelvis W Contrast  06/14/2012  *RADIOLOGY REPORT*  Clinical Data: Left lower quadrant pain.  Diarrhea.  Previous colon carcinoma status post surgical resection.  CT ABDOMEN AND PELVIS WITH CONTRAST  Technique:  Multidetector CT imaging of the abdomen and pelvis was performed following the standard protocol during bolus administration of intravenous contrast.  Contrast: OMNIPAQUE IOHEXOL 300 MG/ML  SOLN  Comparison: None.  Findings: Multiple small hepatic cysts are seen, but no liver masses are identified.  The gallbladder, pancreas, spleen, adrenal glands, and kidneys are normal in appearance.  No evidence of hydronephrosis.  Incidental note is made of duplicated left intrarenal collecting system. No soft tissue masses or lymphadenopathy identified within the abdomen or pelvis.  Sigmoid diverticulosis  is noted.  There is mild sigmoid colonic wall thickening and minimal stranding in the pericolonic fat, consistent with mild diverticulitis.  No evidence of abscess or free fluid. No other inflammatory process identified. Small left inguinal hernia noted containing only fat.   Old T11 vertebral compression fracture deformity incidentally noted.  IMPRESSION:  1.  Mild sigmoid diverticulitis.  No evidence of abscess or other complication. 2.  No evidence of recurrent or metastatic carcinoma. 3.  Small left inguinal hernia containing only fat. 4.  Duplicated left renal collecting system incidentally noted.  No evidence of hydronephrosis.   Original Report Authenticated By: Myles Rosenthal, M.D.    Dg Chest Portable 1 View  06/13/2012  *RADIOLOGY REPORT*  Clinical Data: Chest pain  PORTABLE CHEST - 1 VIEW  Comparison: 02/06/2010  Findings: The heart size and pulmonary vascularity are normal. The lungs appear clear and expanded without focal air space disease or consolidation. No blunting of the costophrenic angles.  No pneumothorax.  Mediastinal contours appear intact.  No significant changes since the previous study.  IMPRESSION: No evidence of active pulmonary disease.   Original Report Authenticated By: Burman Nieves, M.D.     Scheduled Meds:   . aspirin EC  325 mg Oral Daily  . heparin  5,000 Units Subcutaneous Q8H  . influenza  inactive virus vaccine  0.5 mL Intramuscular Tomorrow-1000  . nitroGLYCERIN  0.2 mg Transdermal Daily  . pantoprazole  40 mg Oral Q0600  . sodium chloride  3 mL Intravenous Q12H   Continuous Infusions:   . sodium chloride 75 mL/hr (06/14/12 0234)    Principal Problem:  *Chest pain, midsternal Active Problems:  Colon cancer-proximal descending left colon  Tobacco use-chews  Abdominal pain- LLQ  Near syncope  Vertigo  Diverticulitis of sigmoid colon        William Patel  [facility] Pager [phone] If 8PM-8AM, please contact night-coverage at [email or web address], password [email or web address] 06/14/2012, 2:55 PM  LOS: 1 day

## 2012-06-14 NOTE — Consult Note (Signed)
CARDIOLOGY CONSULT NOTE  Patient ID: William Patel, MRN: [id number], DOB/AGE: November 21, 1933 79 y.o. Admit date: 06/13/2012 Date of Consult: 06/14/2012  Primary Physician: Kaleen Mask, MD Primary Cardiologist: Dr. [place]  Chief Complaint: chest pain Reason for Consultation: chest pain  HPI: 79 y.o. male w/ PMHx significant for tobacco abuse, Colon CA (s/p hemicolectomy) and vertigo, no prior cardiac history who presented to [facility] on 06/13/2012 with complaints of chest pain.  No prior cardiac history. No h/o HLD, HTN, DM, or CVA. William Patel was evaluated by Dr. Swaziland 07/2011 for preop clearance for hemicolectomy due to colon CA. He was felt to be acceptable risk for the surgery and underwent left hemicolectomy 08/2011 without complications. He reports over the last month "since it got cold" he has felt more fatigued and short of breath with activity. 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 Yesterday while lifting heavy wood he developed sudden onset "throbbing" substernal chest pain with radiation to his left shoulder. It was associated with shortness of breath and dizziness. He was given ASA with some relief and then NTG with complete relief (total pain ~30 mins). He has never felt this type of pain before. He denies orthopnea, PND, edema, weight change, syncope, or palpitations. He notes dark "almost black" stools that he first noticed about 3 weeks ago. No BRB. He complains of LLQ abd pain over the past couple of weeks that is relieved with flatulence. No diarrhea, fever, or chills. He smokes a cigar on sundays and chews tobacco daily.  EKG shows NSR 67bpm, no acute ST/T changes, no changes from prior EKG. CXR was without acute cardiopulmonary findings. CT abd/pelvis was without acute findings. Labs are significant for unremarkable CBC/CMET. Cardiac enzymes have been cycled and are  negative. Cardiology is asked to evaluate for cardiac source of chest pain. Echo is pending. He has not had any recurrent chest pain. Reports some dizziness "room spinning" that he states is his vertigo.   Past Medical History  Diagnosis Date  . Vertigo   . Visual disturbances   . Leg swelling occasional    both legs  . Rectal bleeding     small amount, none current  . Hematuria   . Colon cancer     Left   . Generalized headaches       Surgical History:  Past Surgical History  Procedure Date  . Colonoscopy feb 2013    endo done also  . Colon resection 09/21/2011    Procedure: COLON RESECTION LAPAROSCOPIC;  Surgeon: Valarie Merino, MD;  Location: [facility];  Service: General;  Laterality: N/A;        Home Meds: Medication Sig  diphenhydrAMINE (BENADRYL) 25 mg capsule Take 25 mg by mouth every 6 (six) hours as needed. For allergy  meclizine (ANTIVERT) 25 MG tablet Take 25 mg by mouth 3 (three) times daily as needed. For vertigo    Inpatient Medications:   . aspirin EC  325 mg Oral Daily  . heparin  5,000 Units Subcutaneous Q8H  . influenza  inactive virus vaccine  0.5 mL Intramuscular Tomorrow-1000  . nitroGLYCERIN  0.2 mg Transdermal Daily  . pantoprazole  40 mg Oral Q0600  . sodium chloride  3 mL Intravenous Q12H   . sodium chloride 75 mL/hr (06/14/12 0234)    Allergies:  Allergies  Allergen Reactions  . Tramadol Other (See Comments)  Causes headaches    History   Social History  . Marital Status: Widowed    Spouse Name: N/A    Number of Children: N/A  . Years of Education: N/A   Occupational History  . Not on file.   Social History Main Topics  . Smoking status: Current Some Day Smoker    Types: Cigars  . Smokeless tobacco: Current User    Types: Chew  . Alcohol Use: 1.8 oz/week    3 Cans of beer per week  . Drug Use: No  . Sexually Active: Not on file   Other Topics Concern  . Not on file   Social History Narrative  . No narrative on file       Family History  Problem Relation Age of Onset  . Heart failure Mother   . Brain cancer Mother   . Brain cancer Father      Review of Systems: General: negative for chills, fever, night sweats or weight changes.  Cardiovascular: As per HPI Dermatological: negative for rash Respiratory: (+) sob; negative for cough or wheezing Urologic: negative for hematuria Abdominal: As per HPI Neurologic: (+) dizziness negative for visual changes, syncope All other systems reviewed and are otherwise negative except as noted above.  Labs:  [facility] 06/14/12 0801 06/14/12 0129  CKTOTAL 42 45  CKMB 2.5 2.7  TROPONINI <0.30 <0.30   Component Value Date   WBC 5.8 06/14/2012   HGB 13.1 06/14/2012   HCT 38.6* 06/14/2012   MCV 93.9 06/14/2012   PLT 122* 06/14/2012    Lab 06/14/12 0801 06/13/12 2124  NA 139 --  K 3.7 --  CL 103 --  CO2 25 --  BUN 17 --  CREATININE 0.95 --  CALCIUM 8.6 --  PROT -- 6.5  BILITOT -- 0.3  ALKPHOS -- 55  ALT -- 10  AST -- 16  GLUCOSE 86 --    Radiology/Studies:   06/14/2012 - CT ABDOMEN AND PELVIS WITH CONTRAST Findings: Multiple small hepatic cysts are seen, but no liver masses are identified.  The gallbladder, pancreas, spleen, adrenal glands, and kidneys are normal in appearance.  No evidence of hydronephrosis.  Incidental note is made of duplicated left intrarenal collecting system. No soft tissue masses or lymphadenopathy identified within the abdomen or pelvis.  Sigmoid diverticulosis is noted.  There is mild sigmoid colonic wall thickening and minimal stranding in the pericolonic fat, consistent with mild diverticulitis.  No evidence of abscess or free fluid. No other inflammatory process identified. Small left inguinal hernia noted containing only fat.   Old T11 vertebral compression fracture deformity incidentally noted.  IMPRESSION:  1.  Mild sigmoid diverticulitis.  No evidence of abscess or other complication. 2.  No evidence of recurrent or  metastatic carcinoma. 3.  Small left inguinal hernia containing only fat. 4.  Duplicated left renal collecting system incidentally noted.  No evidence of hydronephrosis.     06/13/2012 - PORTABLE CHEST - 1 VIEW Findings: The heart size and pulmonary vascularity are normal. The lungs appear clear and expanded without focal air space disease or consolidation. No blunting of the costophrenic angles.  No pneumothorax.  Mediastinal contours appear intact.  No significant changes since the previous study.  IMPRESSION: No evidence of active pulmonary disease.      EKG: 06/14/11 NSR 67bpm, no acute ST/T changes, no changes from prior EKG  Physical Exam: Blood pressure 100/68, pulse 69, temperature 97.6 F (36.4 C), temperature source Oral, resp. rate 18, height 5' 1.25" (  1.556 m), weight 129 lb (58.514 kg), SpO2 98.00%. General: Well developed, elderly white male in no acute distress. Head: Normocephalic, atraumatic, sclera non-icteric, no xanthomas, nares are without discharge.  Neck: Supple. Negative for carotid bruits. No JVD Lungs: Clear bilaterally to auscultation without wheezes, rales, or rhonchi. Breathing is unlabored. Heart: RRR with S1 S2. No murmurs, rubs, or gallops appreciated. Abdomen: Soft, non-tender, non-distended with normoactive bowel sounds. No rebound/guarding. No obvious abdominal masses. Msk:  Strength and tone appear normal for age. Extremities: No clubbing or cyanosis. No edema.  Distal pedal pulses are 2+ and equal bilaterally. Neuro: Alert and oriented X 3. Moves all extremities spontaneously. Psych:  Responds to questions appropriately with a normal affect.   Assessment and Plan:  79 y.o. male w/ PMHx significant for tobacco abuse, Colon CA (s/p hemicolectomy) and vertigo, no prior cardiac history who presented to [facility] on 06/13/2012 with complaints of chest pain.  1. Precordial pain 2. Dyspnea 3. Melena  4. Tobacco abuse 5. Vertigo   Patient presents  with an episode of substernal chest pain with typical and atypical features for ACS. He has also been more fatigued and short of breath over the past month. EKG is nonischemic and cardiac enzymes normal. His cardiac risk factors include age and tobacco abuse. Will plan for exercise nuc stress tomorrow with further plans pending results. Follow up echo. He is not anemic, but given complaints of melena and abd pain will guaiac stools. Encouraged tobacco cessation.   Signed, HOPE, JESSICA PA-C 06/14/2012, 12:57 PM   Attending Note:   The patient was seen and examined.  Agree with assessment and plan as noted above.  He is a very healthy gentleman who presented with exertional  CP, dyspnea, and severe palpitations while loading firewood into his trailer.  The pain lasted for 20-30 minutes.  The pain initially worsened with walking to his shed.  A neighbor gave him some aspirn and the pain improved .  Exam is unremarkable.  Cardiac enzymes are negative so far.    I would like to do a stress myoview tomorrow.   I think he can go home tomorrow if the myoview is normal.  Will check lipid panel.   Vesta Mixer, Montez Hageman., MD, [facility] 06/14/2012, 2:01 PM

## 2012-06-14 NOTE — Progress Notes (Signed)
  Echocardiogram 2D Echocardiogram has been performed.  William Patel 06/14/2012, 1:15 PM

## 2012-06-14 NOTE — H&P (Signed)
History and Physical       Hospital Admission Note Date: 06/14/2012  Patient name: William Patel Medical record number: [id number] Date of birth: [birth year] Age: 79 y.o. Gender: male PCP: Kaleen Mask, MD    Chief Complaint:  Chest pain  HPI: Patient is a 79 year old male with history of vertigo, history of left colon cancer had a bowel resection in April 2013 presented to ED with chest pain. History was obtained from the patient who stated that he developed the chest pain with some shortness of breath after lifting would fireplace today. He labeled it as a heavy exertion. He described the chest pain as 8/10 in intensity, "stinging", and he felt dizzy. He also felt his hands were somewhat numb. Patient was given aspirin with 2 nitroglycerin pills and his pain was resolved. Patient states that the dizziness was different from the vertigo he usually has. He also has been reporting of a non-left lower quadrant abdominal pain and described this as similar to his colon cancer pain before the surgery.   Review of Systems:  Constitutional: Denies fever, chills, diaphoresis, appetite change and fatigue.  HEENT: Denies photophobia, eye pain, redness, hearing loss, ear pain, congestion, sore throat, rhinorrhea, sneezing, mouth sores, trouble swallowing, neck pain, neck stiffness and tinnitus.   Respiratory: Denies SOB, DOE, cough,  and wheezing.   Cardiovascular: Please see history of present illness  Gastrointestinal: Denies nausea, vomiting, diarrhea, constipation, blood in stool and abdominal distention.  please see history of present illness regarding abdominal pain Genitourinary: Denies dysuria, urgency, frequency, hematuria, flank pain and difficulty urinating.  Musculoskeletal: Denies myalgias, back pain, joint swelling, arthralgias and gait problem.  Skin: Denies pallor, rash and wound.  Neurological: Denies seizures, syncope,  weakness, and headaches.  Hematological: Denies adenopathy. Easy bruising, personal or family bleeding history  Psychiatric/Behavioral: Denies suicidal ideation, mood changes, confusion, nervousness, sleep disturbance and agitation  Past Medical History: Past Medical History  Diagnosis Date  . Vertigo   . Visual disturbances   . Leg swelling occasional    both legs  . Rectal bleeding     small amount, none current  . Hematuria   . Colon cancer     Left   . Generalized headaches    Past Surgical History  Procedure Date  . Colonoscopy feb 2013    endo done also  . Colon resection 09/21/2011    Procedure: COLON RESECTION LAPAROSCOPIC;  Surgeon: Valarie Merino, MD;  Location: [facility];  Service: General;  Laterality: N/A;       Medications: Prior to Admission medications   Medication Sig Start Date End Date Taking? Authorizing Provider  diphenhydrAMINE (BENADRYL) 25 mg capsule Take 25 mg by mouth every 6 (six) hours as needed. For allergy   Yes Historical Provider, MD  meclizine (ANTIVERT) 25 MG tablet Take 25 mg by mouth 3 (three) times daily as needed. For vertigo   Yes Historical Provider, MD    Allergies:   Allergies  Allergen Reactions  . Tramadol Other (See Comments)    Causes headaches    Social History:  reports that he has been smoking Cigars.  His smokeless tobacco use includes Chew. He reports that he drinks about 1.8 ounces of alcohol per week. He reports that he does not use illicit drugs. He lives at home and is functional with his ADLs  Family History: Family History  Problem Relation Age of Onset  . Heart failure Mother   . Brain cancer Mother   .  Brain cancer Father     Physical Exam: Blood pressure 109/70, pulse 63, temperature 97.6 F (36.4 C), resp. rate 17, SpO2 100.00%. General: Alert, awake, oriented x3, in no acute distress. HEENT: normocephalic, atraumatic, anicteric sclera, pink conjunctiva, pupils equal and reactive to light and  accomodation, oropharynx clear Neck: supple, no masses or lymphadenopathy, no goiter, no bruits  Heart: Regular rate and rhythm, without murmurs, rubs or gallops. Lungs: Clear to auscultation bilaterally, no wheezing, rales or rhonchi. Abdomen: Soft, nontender, nondistended, positive bowel sounds, no masses. Extremities: No clubbing, cyanosis or edema with positive pedal pulses. Neuro: Grossly intact, no focal neurological deficits, strength 5/5 upper and lower extremities bilaterally Psych: alert and oriented x 3, normal mood and affect Skin: no rashes or lesions, warm and dry   LABS on Admission:  Basic Metabolic Panel:  Lab 06/13/12 9604  NA 141  K 3.9  CL 103  CO2 24  GLUCOSE 82  BUN 15  CREATININE 1.01  CALCIUM 9.0  MG --  PHOS --   Liver Function Tests:  Lab 06/13/12 2124  AST 16  ALT 10  ALKPHOS 55  BILITOT 0.3  PROT 6.5  ALBUMIN 3.7   No results found for this basename: LIPASE:2,AMYLASE:2 in the last 168 hours No results found for this basename: AMMONIA:2 in the last 168 hours CBC:  Lab 06/13/12 2124  WBC 5.4  NEUTROABS --  HGB 14.1  HCT 40.4  MCV 93.1  PLT 135*   Cardiac Enzymes: No results found for this basename: CKTOTAL:2,CKMB:2,CKMBINDEX:2,TROPONINI:2 in the last 168 hours BNP: No components found with this basename: POCBNP:2 CBG: No results found for this basename: GLUCAP:2 in the last 168 hours   Radiological Exams on Admission: Dg Chest Portable 1 View  06/13/2012  *RADIOLOGY REPORT*  Clinical Data: Chest pain  PORTABLE CHEST - 1 VIEW  Comparison: 02/06/2010  Findings: The heart size and pulmonary vascularity are normal. The lungs appear clear and expanded without focal air space disease or consolidation. No blunting of the costophrenic angles.  No pneumothorax.  Mediastinal contours appear intact.  No significant changes since the previous study.  IMPRESSION: No evidence of active pulmonary disease.   Original Report Authenticated By: Burman Nieves, M.D.     Assessment/Plan Principal Problem:  *Chest pain, midsternal: Somewhat atypical possibly musculoskeletal - admit under observation, obtain serial cardiac enzymes, -  continue aspirin, nitroglycerin patch, pain control, PPI , 2-D echocardiogram for further workup  - Chest x-ray did not show any acute on a process  Active Problems: Abdominal pain- LLQ with history of  Colon cancer-proximal descending left colon - I will obtain CT abdomen and pelvis to rule out any recurrence of the tumor, obtain CEA - Patient surgeon is Dr. Luretha Murphy   Tobacco use-chews - Patient was counseled for tobacco cessation   Near syncope - Follow 2-D echo results, gentle hydration    Vertigo: Continue meclizine   DVT prophylaxis:  Lovenox   CODE STATUS:  full code   Further plan will depend as patient's clinical course evolves and further radiologic and laboratory data become available.   Time Spent on Admission: 1 hour   Faythe Heitzenrater M.D. [facility] 06/14/2012, 12:47 AM Pager: [phone]  If 7PM-7AM, please contact night-coverage [email or web address] Password [email or web address]

## 2012-06-14 NOTE — Evaluation (Signed)
Physical Therapy Evaluation Patient Details Name: William Patel MRN: [id number] DOB: 11-14-1933 Today's Date: 06/14/2012 Time: 0865-7846 PT Time Calculation (min): 41 min  PT Assessment / Plan / Recommendation Clinical Impression  79 yo adm with chest pain and vertigo. Pt with longstanding h/o of inner ear problems (including vertigo) with + symptoms with all positional testing. Rt horizontal canal testing provoked the worst symtoms and treated with BBQ roll cannalith repositioning. Pt reported feeling less symptomatic afterwards. Will need to reassess 1/15 prior to d/c.     PT Assessment  Patient needs continued PT services    Follow Up Recommendations  Outpatient PT;Supervision/Assistance - 24 hour (for vestibular rehab)    Does the patient have the potential to tolerate intense rehabilitation      Barriers to Discharge None      Equipment Recommendations  None recommended by PT    Recommendations for Other Services     Frequency Min 4X/week    Precautions / Restrictions Precautions Precautions: Fall Precaution Comments: pt denies falls (last fell 2 years ago--hit posterior head with + LOC and vertigo afterwards)   Pertinent Vitals/Pain Denied pain throughout session; see notes re: vertigo/nausea     Mobility  Bed Mobility Bed Mobility: Rolling Right;Rolling Left;Supine to Sit;Sit to Supine;Sitting - Scoot to Edge of Bed Rolling Right: 7: Independent Rolling Left: 7: Independent Supine to Sit: 6: Modified independent (Device/Increase time);HOB flat Sitting - Scoot to Edge of Bed: 6: Modified independent (Device/Increase time) Sit to Supine: 7: Independent;HOB flat Details for Bed Mobility Assistance: no difficulties with movement; all caused vertigo (worst is coming supine to sit) Transfers Transfers: Sit to Stand;Stand to Sit Sit to Stand: 4: Min guard;Without upper extremity assist;From bed Stand to Sit: 4: Min guard;Without upper extremity assist;To bed;To  chair/3-in-1 Details for Transfer Assistance: mnguard assist due to dizziness/vertigo with imbalance Ambulation/Gait Ambulation/Gait Assistance: 4: Min guard;4: Min Environmental consultant (Feet): 60 Feet Assistive device: None Ambulation/Gait Assistance Details: prior to repositioning, pt with staggering LOB requiring min assist to recover; after cannalith repositioning, able to walk with minguard assist and maintain straight path Gait Pattern: Within Functional Limits         Exercises  Vestibular eval--pt reports vertigo started again about 2 months ago; denies hitting head; reports it sometimes lasts for minutes; frequently causes nausea; + staggering; Eye exam --Rt eye adducts at rest (pt reports since childhood); able to follow target into all quadrants, however Lt eye occasionally losing target when moved to his Lt (inconsistently); denies double vision, but does report his vision has been more blurry since vertigo began; no nystagmus noted; BPPV testing--Performed bil Hallpike-Dix with no nystagmus seen (pt likely accomodating after prolonged time with symptoms), however + spinning sensation with Rt 5/10 ~ 5 sec delay and lasting 2 minutes without diminishing, Lt 2/10 ~ 5sec delay and diminishing after 30 seconds. Head roll test bil with Rt symptoms 8/10 (delayed onset ~3 seconds, diminished to 2/10 after 45 sec) to Lt 2/10 (~3 sec delay and lasting 20 seconds).  Only nystagmus seen was with head roll testing as moving his head from Rt towards the Lt to midline-noted 3 beats horizontally with fast beat to Lt. Noted again during BBQ roll to treat Rt horizontal canal BPPV. Pt educated to stay seated/upright x 1 hour, to avoid quick head turns, and to perform prolonged positioning for Rt ear affected when going to sleep tonight (start on Rt side, roll to Lt side and remain on Lt side for  as many hours as possible).    PT Diagnosis: Difficulty walking;Other (comment) (vertigo)  PT Problem List:  Decreased activity tolerance;Decreased balance;Decreased mobility;Other (comment) (vertigo) PT Treatment Interventions: Gait training;Functional mobility training;Therapeutic activities;Therapeutic exercise;Balance training;Patient/family education;Other (comment) (Cannalith repositioning)   PT Goals Acute Rehab PT Goals PT Goal Formulation: With patient Time For Goal Achievement: 06/17/12 Potential to Achieve Goals: Good Pt will go Supine/Side to Sit: with modified independence;with HOB 0 degrees;Other (comment) (dizziness <2/10) PT Goal: Supine/Side to Sit - Progress: Goal set today Pt will go Sit to Supine/Side: with modified independence;with HOB 0 degrees;Other (comment) (dizziness <2/10) PT Goal: Sit to Supine/Side - Progress: Goal set today Pt will go Sit to Stand: with supervision;Other (comment) (dizziness <2/10) PT Goal: Sit to Stand - Progress: Goal set today Pt will go Stand to Sit: with supervision;Other (comment) (dizziness <2/10) PT Goal: Stand to Sit - Progress: Goal set today Pt will Ambulate: >150 feet;with supervision;with least restrictive assistive device;Other (comment) (dizziness <2/10) PT Goal: Ambulate - Progress: Goal set today  Visit Information  Last PT Received On: 06/14/12 Assistance Needed: +1    Subjective Data  Subjective: Pt reports he's had different ear problems (infections, vertigo) off/on his whole life. Reports + spinning (to Lt) with rolling onto his side (either Lt or Rt) and with supine to sit or sudden/quick head turns. 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; very active PTA Communication Communication: No difficulties    Cognition  Overall Cognitive Status: Appears within functional limits for tasks assessed/performed Arousal/Alertness: Awake/alert Orientation Level: Appears intact for tasks assessed Behavior During Session: [facility] for tasks performed    Extremity/Trunk Assessment Right Upper Extremity Assessment RUE ROM/Strength/Tone: [facility] for tasks assessed Left Upper Extremity Assessment LUE ROM/Strength/Tone: WFL for tasks assessed Right Lower Extremity Assessment RLE ROM/Strength/Tone: [facility] for tasks assessed Left Lower Extremity Assessment LLE ROM/Strength/Tone: [facility] for tasks assessed Trunk Assessment Trunk Assessment: Normal   Balance Balance Balance Assessed: Yes Static Sitting Balance Static Sitting - Balance Support: No upper extremity supported;Feet supported Static Sitting - Level of Assistance: 5: Stand by assistance (+ vertigo and sense he was going to fall) Static Sitting - Comment/# of Minutes: no imbalance noted, however pt reporting spinning sensation Static Standing Balance Static Standing - Balance Support: No upper extremity supported Static Standing - Level of Assistance: 4: Min assist Static Standing - Comment/# of Minutes: lean and stagger to his left on initial attempt; quickly returned to seated position  End of Session PT - End of Session Activity Tolerance: Patient tolerated treatment well Patient left: in chair;with call bell/phone within reach Nurse Communication: Mobility status;Other (comment) (BPPV precautions/positioning)  GP Functional Assessment Tool Used: clinical judgment Functional Limitation: Mobility: Walking and moving around Mobility: Walking and Moving Around  Current Status [phone]): At least 20 percent but less than 40 percent impaired, limited or restricted Mobility: Walking and Moving Around Goal Status [phone]): At least 1 percent but less than 20 percent impaired, limited or restricted  William Patel 06/14/2012, 2:48 PM  Pager [phone]

## 2012-06-15 ENCOUNTER — Observation Stay (HOSPITAL_COMMUNITY): Payer: Medicare Other

## 2012-06-15 DIAGNOSIS — I4949 Other premature depolarization: Secondary | ICD-10-CM

## 2012-06-15 DIAGNOSIS — R0789 Other chest pain: Secondary | ICD-10-CM

## 2012-06-15 LAB — LIPID PANEL
Cholesterol: 198 mg/dL (ref 0–200)
HDL: 51 mg/dL (ref >39)
Total CHOL/HDL Ratio: 3.9 RATIO

## 2012-06-15 MED ORDER — TECHNETIUM TC 99M SESTAMIBI GENERIC - CARDIOLITE
30.0000 | Freq: Once | INTRAVENOUS | Status: AC | PRN
  Administered 2012-06-15: 30 via INTRAVENOUS

## 2012-06-15 MED ORDER — TECHNETIUM TC 99M SESTAMIBI GENERIC - CARDIOLITE
10.0000 | Freq: Once | INTRAVENOUS | Status: AC | PRN
  Administered 2012-06-15: 10 via INTRAVENOUS

## 2012-06-15 NOTE — Evaluation (Signed)
Occupational Therapy Evaluation Patient Details Name: William Patel MRN: [id number] DOB: [birth year] Today's Date: 06/15/2012 Time: 4098-1191 OT Time Calculation (min): 18 min  OT Assessment / Plan / Recommendation Clinical Impression  Pt admitted with chest pain. PMHx significant for tobacco abuse, Colon CA (s/p hemicolectomy) and vertigo.  Pt experiencing minimal dizziness today.  Pt able to perform ADLs at mod I level.  No acute OT needs at this time. If pt experiences functional decline.    OT Assessment  Patient does not need any further OT services    Follow Up Recommendations  No OT follow up;Supervision/Assistance - 24 hour    Barriers to Discharge      Equipment Recommendations  None recommended by OT    Recommendations for Other Services    Frequency       Precautions / Restrictions Precautions Precautions: Fall   Pertinent Vitals/Pain See vitals    ADL  Eating/Feeding: Independent;Performed Where Assessed - Eating/Feeding: Edge of bed Grooming: Performed;Wash/dry hands;Modified independent Where Assessed - Grooming: Unsupported standing Upper Body Bathing: Simulated;Modified independent Where Assessed - Upper Body Bathing: Unsupported sitting Lower Body Bathing: Simulated;Modified independent Where Assessed - Lower Body Bathing: Unsupported sit to stand Upper Body Dressing: Simulated;Modified independent Where Assessed - Upper Body Dressing: Unsupported sitting Lower Body Dressing: Performed;Modified independent Where Assessed - Lower Body Dressing: Unsupported sit to stand Toilet Transfer: Simulated;Supervision/safety Toilet Transfer Method: Sit to Barista:  (bed) Equipment Used: Gait belt Transfers/Ambulation Related to ADLs: supervision for safety. No LOB. ADL Comments: Pt reporting 2/10 dizziness while standing and performing tasks. Able to cross ankles over knees to don/doff socks.  Reports he is feeling much better today.  Good  safety awarness of IV line while ambulating and turning.    OT Diagnosis:    OT Problem List:   OT Treatment Interventions:     OT Goals    Visit Information  Last OT Received On: 06/15/12 Assistance Needed: +1    Subjective Data      Prior Functioning     Home Living Lives With: Alone Available Help at Discharge: Family;Available PRN/intermittently (son and daughter live next door) Type of Home: House Home Access: Stairs to enter [employer] of Steps: 3 Entrance Stairs-Rails: Right Home Layout: Two level;Able to live on main level with bedroom/bathroom;Laundry or work area in Artist of Steps: 12 Bathroom Shower/Tub: Network engineer: Crutches;Straight cane;Shower chair with back Prior Function Level of Independence: Independent Able to Take Stairs?: Reciprically Driving: Yes 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    Cognition  Overall Cognitive Status: Appears within functional limits for tasks assessed/performed Arousal/Alertness: Awake/alert Orientation Level: Appears intact for tasks assessed Behavior During Session: [facility] for tasks performed    Extremity/Trunk Assessment Right Upper Extremity Assessment RUE ROM/Strength/Tone: [facility] for tasks assessed Left Upper Extremity Assessment LUE ROM/Strength/Tone: WFL for tasks assessed Trunk Assessment Trunk Assessment: Normal     Mobility Bed Mobility Bed Mobility:  (pt sitting EOB) Transfers Transfers: Sit to Stand;Stand to Sit Sit to Stand: From bed;6: Modified independent (Device/Increase time) Stand to Sit: To  bed;6: Modified independent (Device/Increase time)  Shoulder Instructions     Exercise     Balance Balance Balance Assessed: Yes Static Sitting Balance Static Sitting - Balance Support: Feet supported;No upper extremity supported Static Sitting - Level of Assistance: 7: Independent Static Standing Balance Static Standing - Balance Support: No upper extremity supported;During functional activity Static Standing - Level of Assistance: 6: Modified independent (Device/Increase time) Static Standing - Comment/# of Minutes: performed grooming task at sink   End of Session OT - End of Session Equipment Utilized During Treatment: Gait belt Activity Tolerance: Patient tolerated treatment well Patient left: in bed;with call bell/phone within reach  GO Functional Assessment Tool Used: clinical judgment Functional Limitation: Self care Self Care Current Status (Z6109): 0 percent impaired, limited or restricted Self Care Goal Status (U0454): 0 percent impaired, limited or restricted Self Care Discharge Status [phone]): 0 percent impaired, limited or restricted  06/15/2012 Cipriano Mile OTR/L Pager [phone] Office [phone]  Cipriano Mile 06/15/2012, 5:11 PM

## 2012-06-15 NOTE — Discharge Summary (Signed)
Physician Discharge Summary  Patient ID: William Patel MRN: [id number] DOB/AGE: 77-Feb-1935 77 y.o.  Admit date: 06/13/2012 Discharge date: 06/15/2012  Primary Care Physician:  Kaleen Mask, MD   Discharge Diagnoses:    Principal Problem:  *Chest pain, midsternal Active Problems:  Colon cancer-proximal descending left colon  Tobacco use-chews  Abdominal pain- LLQ  Near syncope  Vertigo  Diverticulitis of sigmoid colon      Medication List     As of 06/15/2012  4:59 PM    TAKE these medications         diphenhydrAMINE 25 mg capsule   Commonly known as: BENADRYL   Take 25 mg by mouth every 6 (six) hours as needed. For allergy      meclizine 25 MG tablet   Commonly known as: ANTIVERT   Take 25 mg by mouth 3 (three) times daily as needed. For vertigo         Disposition and Follow-up:  Will be discharged home today in stable and improved condition. Has been instructed to follow up with his PCP in 2 weeks.  Consults:  Cardiology, Dr. [place]   Significant Diagnostic Studies:  Ct Abdomen Pelvis W Contrast  06/14/2012  *RADIOLOGY REPORT*  Clinical Data: Left lower quadrant pain.  Diarrhea.  Previous colon carcinoma status post surgical resection.  CT ABDOMEN AND PELVIS WITH CONTRAST  Technique:  Multidetector CT imaging of the abdomen and pelvis was performed following the standard protocol during bolus administration of intravenous contrast.  Contrast: OMNIPAQUE IOHEXOL 300 MG/ML  SOLN  Comparison: None.  Findings: Multiple small hepatic cysts are seen, but no liver masses are identified.  The gallbladder, pancreas, spleen, adrenal glands, and kidneys are normal in appearance.  No evidence of hydronephrosis.  Incidental note is made of duplicated left intrarenal collecting system. No soft tissue masses or lymphadenopathy identified within the abdomen or pelvis.  Sigmoid diverticulosis is noted.  There is mild sigmoid colonic wall thickening and minimal  stranding in the pericolonic fat, consistent with mild diverticulitis.  No evidence of abscess or free fluid. No other inflammatory process identified. Small left inguinal hernia noted containing only fat.   Old T11 vertebral compression fracture deformity incidentally noted.  IMPRESSION:  1.  Mild sigmoid diverticulitis.  No evidence of abscess or other complication. 2.  No evidence of recurrent or metastatic carcinoma. 3.  Small left inguinal hernia containing only fat. 4.  Duplicated left renal collecting system incidentally noted.  No evidence of hydronephrosis.   Original Report Authenticated By: Myles Rosenthal, M.D.    Nm Myocar Multi W/spect W/wall Motion / Ef  06/15/2012  William Patel is a 79 year old gentleman who was admitted to the hospital with episodes of  chest discomfort.  Rule out myocardial infarction.  Scheduled for a stress Myoview study for further evaluation.  The patient received an IV injection of Myoview and the resting Myoview images were obtained following brief delay.  The patient walked on a standard Bruce protocol treadmill test for total of 5 minutes.  He achieved a peak heart rate of 126.  He stopped Due to generalized fatigue and some mild short breath. There were No ST-T wave changes to suggest ischemia.  He was given Lexiscan because of the inability to reach target heart rate. There were no EKG changes with the Lexiscan.  A second dose of Myoview was injected and the quantitated gated SPECT images were obtained following brief delay.  The raw data images reveals no significant motion artifact.  The stress images   smooth and normal uptake of all areas of the myocardium.  The resting Myoview images reveal a similar pattern of smooth and homogeneous uptake of all areas of the myocardium. There is no evidence of ischemia.  The cine loop images reveal an end diastolic volume of 60 ml.  The end-systolic volume is 25 ml.  The calculated ejection fraction is 58%.  Visually, I think that  the ejection fraction is actually higher than 58%.  It appears that the computer generated contours do not accuratley follow the areas of uptake in the left ventrical. I suspect that the ejection fraction is closer to 65-70%.  There are no segmental wall motion abnormalities.  Impression:  Normal stress/Lexiscan Myoview study. Normal left ventricular systolic function with an ejection fraction of 58%.  Kristeen Miss, MD, [facility]  Jun 15, 2012,  4:15 PM   Original Report Authenticated By: Kristeen Miss, M.D.    Dg Chest Portable 1 View  06/13/2012  *RADIOLOGY REPORT*  Clinical Data: Chest pain  PORTABLE CHEST - 1 VIEW  Comparison: 02/06/2010  Findings: The heart size and pulmonary vascularity are normal. The lungs appear clear and expanded without focal air space disease or consolidation. No blunting of the costophrenic angles.  No pneumothorax.  Mediastinal contours appear intact.  No significant changes since the previous study.  IMPRESSION: No evidence of active pulmonary disease.   Original Report Authenticated By: Burman Nieves, M.D.     Brief H and P: For complete details please refer to admission H and P, but in brief patient is a 79 year old male with history of vertigo, history of left colon cancer had a bowel resection in April 2013 presented to ED with chest pain. History was obtained from the patient who stated that he developed the chest pain with some shortness of breath after lifting would fireplace today. He labeled it as a heavy exertion. He described the chest pain as 8/10 in intensity, "stinging", and he felt dizzy. He also felt his hands were somewhat numb. Patient was given aspirin with 2 nitroglycerin pills and his pain was resolved. Patient states that the dizziness was different from the vertigo he usually has. We were asked to admit him for further evaluation and management.     Hospital Course:  Principal Problem:  *Chest pain, midsternal Active Problems:  Colon  cancer-proximal descending left colon  Tobacco use-chews  Abdominal pain- LLQ  Near syncope  Vertigo  Diverticulitis of sigmoid colon   Chest Pain -Has ruled out for ACS. -Stress myoview negative as above. -Very atypical, ?chest wall pain. -No further cardiac work up recommended at this point.  Rest of chronic medical issues have been stable this hospitalization.   Time spent on Discharge: Greater than 30 minutes.  SignedChaya Jan [facility] Pager: [phone] 06/15/2012, 4:59 PM

## 2012-06-15 NOTE — Progress Notes (Signed)
   PT Cancellation Note  Patient Details Name: William Patel MRN: [id number] DOB: 1933-11-30   Cancelled Treatment:    Reason Eval/Treat Not Completed: Patient at procedure or test/unavailable (In stress test all am)   INGOLD,Ali Mohl 06/15/2012, 12:01 PM  [facility] [phone] (pager)

## 2012-06-15 NOTE — Progress Notes (Signed)
TELEMETRY: Reviewed telemetry pt in NSR with occ PVC: Filed Vitals:   06/14/12 0544 06/14/12 1450 06/14/12 2011 06/15/12 0427  BP: 100/68 108/61 110/62 110/61  Pulse: 69 65 56 54  Temp: 97.6 F (36.4 C) 98 F (36.7 C) 98.8 F (37.1 C) 98.4 F (36.9 C)  TempSrc: Oral Oral Oral Oral  Resp: 18 18 18 18   Height:      Weight:      SpO2: 98% 95% 98% 97%    Intake/Output Summary (Last 24 hours) at 06/15/12 0814 Last data filed at 06/15/12 0300  Gross per 24 hour  Intake    600 ml  Output   1300 ml  Net   -700 ml    SUBJECTIVE Denies any chest pain, SOB or abdominal pain. Feels well.  LABS: Basic Metabolic Panel:  Basename 06/14/12 0801 06/13/12 2124  NA 139 141  K 3.7 3.9  CL 103 103  CO2 25 24  GLUCOSE 86 82  BUN 17 15  CREATININE 0.95 1.01  CALCIUM 8.6 9.0  MG -- --  PHOS -- --   Liver Function Tests:  [facility] 06/13/12 2124  AST 16  ALT 10  ALKPHOS 55  BILITOT 0.3  PROT 6.5  ALBUMIN 3.7   No results found for this basename: LIPASE:2,AMYLASE:2 in the last 72 hours CBC:  Basename 06/14/12 0801 06/13/12 2124  WBC 5.8 5.4  NEUTROABS -- --  HGB 13.1 14.1  HCT 38.6* 40.4  MCV 93.9 93.1  PLT 122* 135*   Cardiac Enzymes:  Basename 06/14/12 1353 06/14/12 0801 06/14/12 0129  CKTOTAL 45 42 45  CKMB 2.5 2.5 2.7  CKMBINDEX -- -- --  TROPONINI <0.30 <0.30 <0.30   BNP: No components found with this basename: POCBNP:3 D-Dimer: No results found for this basename: DDIMER:2 in the last 72 hours Hemoglobin A1C: No results found for this basename: HGBA1C in the last 72 hours Fasting Lipid Panel:  Basename 06/15/12 0440  CHOL 198  HDL 51  LDLCALC 115*  TRIG 162*  CHOLHDL 3.9  LDLDIRECT --   T Radiology/Studies:  Ct Abdomen Pelvis W Contrast  06/14/2012  *RADIOLOGY REPORT*  Clinical Data: Left lower quadrant pain.  Diarrhea.  Previous colon carcinoma status post surgical resection.  CT ABDOMEN AND PELVIS WITH CONTRAST  Technique:  Multidetector CT  imaging of the abdomen and pelvis was performed following the standard protocol during bolus administration of intravenous contrast.  Contrast: OMNIPAQUE IOHEXOL 300 MG/ML  SOLN  Comparison: None.  Findings: Multiple small hepatic cysts are seen, but no liver masses are identified.  The gallbladder, pancreas, spleen, adrenal glands, and kidneys are normal in appearance.  No evidence of hydronephrosis.  Incidental note is made of duplicated left intrarenal collecting system. No soft tissue masses or lymphadenopathy identified within the abdomen or pelvis.  Sigmoid diverticulosis is noted.  There is mild sigmoid colonic wall thickening and minimal stranding in the pericolonic fat, consistent with mild diverticulitis.  No evidence of abscess or free fluid. No other inflammatory process identified. Small left inguinal hernia noted containing only fat.   Old T11 vertebral compression fracture deformity incidentally noted.  IMPRESSION:  1.  Mild sigmoid diverticulitis.  No evidence of abscess or other complication. 2.  No evidence of recurrent or metastatic carcinoma. 3.  Small left inguinal hernia containing only fat. 4.  Duplicated left renal collecting system incidentally noted.  No evidence of hydronephrosis.   Original Report Authenticated By: Myles Rosenthal, M.D.    Dg  Chest Portable 1 View  06/13/2012  *RADIOLOGY REPORT*  Clinical Data: Chest pain  PORTABLE CHEST - 1 VIEW  Comparison: 02/06/2010  Findings: The heart size and pulmonary vascularity are normal. The lungs appear clear and expanded without focal air space disease or consolidation. No blunting of the costophrenic angles.  No pneumothorax.  Mediastinal contours appear intact.  No significant changes since the previous study.  IMPRESSION: No evidence of active pulmonary disease.   Original Report Authenticated By: Burman Nieves, M.D.    Transthoracic Echocardiography  Patient: Alp, Goldwater MR #: [id number] Study Date: 06/14/2012 Gender:  M Age: 79 Height: 155.6cm Weight: 58.5kg BSA: 1.20m^2 Pt. Status: Room: 2034  PERFORMING Shvc SONOGRAPHER Cathie Beams ORDERING Rai, Ripudeep REFERRING Rai, Ripudeep cc:  ------------------------------------------------------------ LV EF: 55% - 60%  ------------------------------------------------------------ Indications: Chest pain 786.51.  ------------------------------------------------------------ History: PMH: Colon cancer. Tobacco use (chews). Left lower quadrant abdominal pain. Near syncope. Vertigo.  ------------------------------------------------------------ Study Conclusions  - Left ventricle: The cavity size was normal. Wall thickness was normal. Systolic function was normal. The estimated ejection fraction was in the range of 55% to 60%. Wall motion was normal; there were no regional wall motion abnormalities. Doppler parameters are consistent with abnormal left ventricular relaxation (grade 1 diastolic dysfunction). The E/e' ratio is >10, suggests elevated LV filling pressure. - Mitral valve: Calcified annulus. Trivial regurgitation. - Left atrium: The atrium was normal in size. - Inferior vena cava: The vessel was normal in size; the respirophasic diameter changes were in the normal range (= 50%); findings are consistent with normal central venous pressure. - Pericardium, extracardiac: There was no pericardial effusion. Transthoracic echocardiography. M-mode, complete 2D, spectral Doppler, and color Doppler. Height: Height: 155.6cm. Height: 61.3in. Weight: Weight: 58.5kg. Weight: 128.7lb. Body mass index: BMI: 24.2kg/m^2. Body surface area: BSA: 1.47m^2. Blood pressure: 100/68. Patient status: Inpatient. Location: Echo laboratory.  ------------------------------------------------------------  ------------------------------------------------------------ Left ventricle: The cavity size was normal. Wall thickness was normal. Systolic function was  normal. The estimated ejection fraction was in the range of 55% to 60%. Wall motion was normal; there were no regional wall motion abnormalities. Doppler parameters are consistent with abnormal left ventricular relaxation (grade 1 diastolic dysfunction). The E/e' ratio is >10, suggests elevated LV filling pressure.  ------------------------------------------------------------ Aorta: Aortic root: The aortic root was normal in size. Ascending aorta: The ascending aorta was normal in size.  ------------------------------------------------------------ Mitral valve: Calcified annulus. Doppler: Trivial regurgitation. Peak gradient: 3mm Hg (D).  ------------------------------------------------------------ Left atrium: The atrium was normal in size.  ------------------------------------------------------------ Right ventricle: The cavity size was normal. Wall thickness was normal. Systolic function was normal.  ------------------------------------------------------------ Pulmonic valve: Poorly visualized. Doppler: No significant regurgitation.  ------------------------------------------------------------ Tricuspid valve: Poorly visualized. Doppler: Trivial regurgitation.  ------------------------------------------------------------ Pulmonary artery: Poorly visualized.  ------------------------------------------------------------ Right atrium: The atrium was normal in size.  ------------------------------------------------------------ Pericardium: There was no pericardial effusion.  ------------------------------------------------------------ Systemic veins: Inferior vena cava: The vessel was normal in size; the respirophasic diameter changes were in the normal range (= 50%); findings are consistent with normal central venous pressure.  ------------------------------------------------------------  2D measurements Normal Doppler Normal Left ventricle measurements LVID ED,  41.9 mm 43-52 Left ventricle chord, Ea, lat 8.09 cm/ ------- PLAX ann, tiss s LVID ES, 29.6 mm 23-38 DP chord, E/Ea, lat 10.19 ------- PLAX ann, tiss FS, chord, 29 % >29 DP PLAX Ea, med 6.04 cm/ ------- LVPW, ED 8.4 mm ------ ann, tiss s IVS/LVPW 1.13 <1.3 DP ratio, ED E/Ea, med 13.64 ------- Ventricular septum ann, tiss IVS, ED  9.52 mm ------ DP Aorta Mitral valve Root diam, 31 mm ------ Peak E vel 82.4 cm/ ------- ED s Left atrium Peak A vel 96.5 cm/ ------- AP dim 24 mm ------ s AP dim 1.5 cm/m^2 <2.2 Deceleratio 285 ms 150-230 index n time Peak 3 mm ------- gradient, D Hg Peak E/A 0.9 ------- ratio Right ventricle Sa vel, lat 17.7 cm/ ------- ann, tiss s DP  ------------------------------------------------------------ Prepared and Electronically Authenticated by  Rennis Golden, [place] 2014-01-14T17:03:[id number]  PHYSICAL EXAM General: Well developed, well nourished, in no acute distress. Head: Normocephalic, atraumatic, sclera non-icteric, no xanthomas, nares are without discharge. Neck: Negative for carotid bruits. JVD not elevated. Lungs: Clear bilaterally to auscultation without wheezes, rales, or rhonchi. Breathing is unlabored. Heart: RRR S1 S2 without murmurs, rubs, or gallops.  Abdomen: Soft, non-tender, non-distended with normoactive bowel sounds. No hepatomegaly. No rebound/guarding. No obvious abdominal masses. Msk:  Strength and tone appears normal for age. Extremities: No clubbing, cyanosis or edema.  Distal pedal pulses are 2+ and equal bilaterally. Neuro: Alert and oriented X 3. Moves all extremities spontaneously. Psych:  Responds to questions appropriately with a normal affect.  ASSESSMENT AND PLAN: 1. Precordial chest pain. Ecg and cardiac enzymes are normal. Echo is normal. For stress myoview today. If negative can discharge home. 2. Melena 3. Tobacco abuse. 4. Hypercholesterolemia. Goal LDL < 100. Now 115. Recommend Mediterranean diet. If stress test is  abnormal will need statin therapy.   Principal Problem:  *Chest pain, midsternal Active Problems:  Colon cancer-proximal descending left colon  Tobacco use-chews  Abdominal pain- LLQ  Near syncope  Vertigo  Diverticulitis of sigmoid colon    Signed, Rafaelita Foister [place] MD,FACC 06/15/2012 8:18 AM

## 2012-07-21 ENCOUNTER — Ambulatory Visit (INDEPENDENT_AMBULATORY_CARE_PROVIDER_SITE_OTHER): Payer: Medicare Other | Admitting: Surgery

## 2013-01-02 ENCOUNTER — Telehealth (INDEPENDENT_AMBULATORY_CARE_PROVIDER_SITE_OTHER): Payer: Self-pay

## 2013-01-02 NOTE — Telephone Encounter (Signed)
Pt called wanting to know who he should see for increased BMs. States he goes to bathroom every time he eats. Pt states other wise he is doing well. Pt eating well. Voiding well. No fever. No vomiting.  Pt advised to see Dr Jeannetta Nap to have work up for his concern and we will be glad to see pt if there is any concern. Pt states he will call Dr Jeannetta Nap and make appt.

## 2013-12-24 ENCOUNTER — Emergency Department (HOSPITAL_COMMUNITY): Payer: Medicare Other

## 2013-12-24 ENCOUNTER — Emergency Department (HOSPITAL_COMMUNITY)
Admission: EM | Admit: 2013-12-24 | Discharge: 2013-12-24 | Disposition: A | Discharge status: Home / Self Care | Payer: Medicare Other | Attending: Emergency Medicine | Admitting: Emergency Medicine

## 2013-12-24 ENCOUNTER — Encounter (HOSPITAL_COMMUNITY): Payer: Self-pay | Admitting: Emergency Medicine

## 2013-12-24 DIAGNOSIS — Z8719 Personal history of other diseases of the digestive system: Secondary | ICD-10-CM | POA: Diagnosis not present

## 2013-12-24 DIAGNOSIS — S0990XA Unspecified injury of head, initial encounter: Secondary | ICD-10-CM | POA: Insufficient documentation

## 2013-12-24 DIAGNOSIS — F17200 Nicotine dependence, unspecified, uncomplicated: Secondary | ICD-10-CM | POA: Insufficient documentation

## 2013-12-24 DIAGNOSIS — Z8669 Personal history of other diseases of the nervous system and sense organs: Secondary | ICD-10-CM | POA: Diagnosis not present

## 2013-12-24 DIAGNOSIS — Y9389 Activity, other specified: Secondary | ICD-10-CM | POA: Insufficient documentation

## 2013-12-24 DIAGNOSIS — W208XXA Other cause of strike by thrown, projected or falling object, initial encounter: Secondary | ICD-10-CM | POA: Insufficient documentation

## 2013-12-24 DIAGNOSIS — Y990 Civilian activity done for income or pay: Secondary | ICD-10-CM | POA: Insufficient documentation

## 2013-12-24 DIAGNOSIS — Y929 Unspecified place or not applicable: Secondary | ICD-10-CM | POA: Diagnosis not present

## 2013-12-24 DIAGNOSIS — R42 Dizziness and giddiness: Secondary | ICD-10-CM | POA: Insufficient documentation

## 2013-12-24 DIAGNOSIS — Z87448 Personal history of other diseases of urinary system: Secondary | ICD-10-CM | POA: Insufficient documentation

## 2013-12-24 DIAGNOSIS — Z85038 Personal history of other malignant neoplasm of large intestine: Secondary | ICD-10-CM | POA: Diagnosis not present

## 2013-12-24 LAB — CBC WITH DIFFERENTIAL/PLATELET
Basophils Absolute: 0 10*3/uL (ref 0.0–0.1)
Basophils Relative: 0 % (ref 0–1)
Eosinophils Absolute: 0.3 10*3/uL (ref 0.0–0.7)
Eosinophils Relative: 6 % — ABNORMAL HIGH (ref 0–5)
HEMATOCRIT: 46.3 % (ref 39.0–52.0)
Hemoglobin: 16.1 g/dL (ref 13.0–17.0)
LYMPHS PCT: 26 % (ref 12–46)
Lymphs Abs: 1.1 10*3/uL (ref 0.7–4.0)
MCH: 33.3 pg (ref 26.0–34.0)
MCHC: 34.8 g/dL (ref 30.0–36.0)
MCV: 95.7 fL (ref 78.0–100.0)
MONO ABS: 0.4 10*3/uL (ref 0.1–1.0)
MONOS PCT: 9 % (ref 3–12)
NEUTROS ABS: 2.5 10*3/uL (ref 1.7–7.7)
Neutrophils Relative %: 59 % (ref 43–77)
Platelets: 120 10*3/uL — ABNORMAL LOW (ref 150–400)
RBC: 4.84 MIL/uL (ref 4.22–5.81)
RDW: 12.9 % (ref 11.5–15.5)
WBC: 4.3 10*3/uL (ref 4.0–10.5)

## 2013-12-24 LAB — URINALYSIS, ROUTINE W REFLEX MICROSCOPIC
Bilirubin Urine: NEGATIVE
GLUCOSE, UA: NEGATIVE mg/dL
HGB URINE DIPSTICK: NEGATIVE
Ketones, ur: NEGATIVE mg/dL
Leukocytes, UA: NEGATIVE
Nitrite: NEGATIVE
Protein, ur: NEGATIVE mg/dL
Specific Gravity, Urine: 1.01 (ref 1.005–1.030)
UROBILINOGEN UA: 0.2 mg/dL (ref 0.0–1.0)
pH: 6 (ref 5.0–8.0)

## 2013-12-24 LAB — BASIC METABOLIC PANEL
Anion gap: 11 (ref 5–15)
BUN: 17 mg/dL (ref 6–23)
CO2: 24 meq/L (ref 19–32)
CREATININE: 1.22 mg/dL (ref 0.50–1.35)
Calcium: 9.2 mg/dL (ref 8.4–10.5)
Chloride: 104 mEq/L (ref 96–112)
GFR calc Af Amer: 64 mL/min — ABNORMAL LOW (ref >90)
GFR calc non Af Amer: 55 mL/min — ABNORMAL LOW (ref >90)
Glucose, Bld: 98 mg/dL (ref 70–99)
Potassium: 4.6 mEq/L (ref 3.7–5.3)
Sodium: 139 mEq/L (ref 137–147)

## 2013-12-24 LAB — TROPONIN I: Troponin I: <0.3 ng/mL (ref <0.30)

## 2013-12-24 MED ORDER — LORAZEPAM 0.5 MG PO TABS: 0.5000 mg | ORAL_TABLET | Freq: Three times a day (TID) | ORAL | Status: DC | PRN

## 2013-12-24 MED ORDER — LORAZEPAM 0.5 MG PO TABS
0.5000 mg | ORAL_TABLET | Freq: Once | ORAL | Status: AC
  Administered 2013-12-24: 0.5 mg via ORAL
  Filled 2013-12-24: qty 1

## 2013-12-24 NOTE — Discharge Instructions (Signed)

## 2013-12-24 NOTE — ED Provider Notes (Signed)
CSN: [id number]     Arrival date & time 12/24/13  1142 History   First MD Initiated Contact with Patient 12/24/13 1200     Chief Complaint  Patient presents with  . Headache  . Dizziness     (Consider location/radiation/quality/duration/timing/severity/associated sxs/prior Treatment) Patient is a 80 y.o. male presenting with headaches and dizziness. The history is provided by the patient.  Headache Associated symptoms: dizziness   Dizziness Associated symptoms: headaches     He complains of headache and dizziness. That started 3 weeks ago after he was struck in the head by a, engine cover, for a tractor. There was no loss of consciousness at that time. He denies fever, chills, cough, shortness of breath, chest pain, abdominal pain, or back pain. He is taking his usual medications, without relief. There are no other known modifying factors.  Past Medical History  Diagnosis Date  . Vertigo   . Visual disturbances   . Leg swelling occasional    both legs  . Rectal bleeding     small amount, none current  . Hematuria   . Colon cancer     Left   . Generalized headaches    Past Surgical History  Procedure Laterality Date  . Colonoscopy  feb 2013    endo done also  . Colon resection  09/21/2011    Procedure: COLON RESECTION LAPAROSCOPIC;  Surgeon: Pedro Earls, MD;  Location: [facility];  Service: General;  Laterality: N/A;      Family History  Problem Relation Age of Onset  . Heart failure Mother   . Brain cancer Mother   . Brain cancer Father    History  Substance Use Topics  . Smoking status: Current Some Day Smoker    Types: Cigars  . Smokeless tobacco: Current User    Types: Chew  . Alcohol Use: 1.8 oz/week    3 Cans of beer per week    Review of Systems  Neurological: Positive for dizziness and headaches.  All other systems reviewed and are negative.     Allergies  Tramadol  Home Medications   Prior to Admission medications   Medication Sig Start  Date End Date Taking? Authorizing Provider  diphenhydrAMINE (BENADRYL) 25 mg capsule Take 25 mg by mouth every 6 (six) hours as needed. For allergy   Yes Historical Provider, MD  ibuprofen (ADVIL,MOTRIN) 200 MG tablet Take 400 mg by mouth every 6 (six) hours as needed for moderate pain.   Yes Historical Provider, MD  LORazepam (ATIVAN) 0.5 MG tablet Take 1 tablet (0.5 mg total) by mouth every 8 (eight) hours as needed (Dizziness). 12/24/13   Richarda Blade, MD   BP 127/78  Pulse 58  Temp(Src) 98.5 F (36.9 C) (Oral)  Resp 19  SpO2 98% Physical Exam  Nursing note and vitals reviewed. Constitutional: He is oriented to person, place, and time. He appears well-developed.  Elderly, frail  HENT:  Head: Normocephalic and atraumatic.  Right Ear: External ear normal.  Left Ear: External ear normal.  Eyes: Conjunctivae and EOM are normal. Pupils are equal, round, and reactive to light.  Neck: Normal range of motion and phonation normal. Neck supple.  Cardiovascular: Normal rate, regular rhythm, normal heart sounds and intact distal pulses.   Pulmonary/Chest: Effort normal and breath sounds normal. He exhibits no bony tenderness.  Abdominal: Soft. There is no tenderness.  Musculoskeletal: Normal range of motion.  Neurological: He is alert and oriented to person, place, and time. No cranial nerve  deficit or sensory deficit. He exhibits normal muscle tone. Coordination normal.  Mildly unsteady gait, but no ataxia. No dysarthria, aphasia or nystagmus  Skin: Skin is warm, dry and intact.  Psychiatric: He has a normal mood and affect. His behavior is normal. Judgment and thought content normal.    ED Course  Procedures (including critical care time) \ Medications  LORazepam (ATIVAN) tablet 0.5 mg (0.5 mg Oral Given 12/24/13 1400)    Patient Vitals for the past 24 hrs:  BP Pulse Resp SpO2  12/24/13 1618 127/78 mmHg 58 - 98 %  12/24/13 1401 136/70 mmHg 61 19 100 %    3:39 PM Reevaluation  with update and discussion. After initial assessment and treatment, an updated evaluation reveals he, states that his dizziness has improved. He is able to walk without ataxia. He, states that he has only a minimal headache at this time. Truth Wolaver L    Labs Review Labs Reviewed  CBC WITH DIFFERENTIAL - Abnormal; Notable for the following:    Platelets 120 (*)    Eosinophils Relative 6 (*)    All other components within normal limits  BASIC METABOLIC PANEL - Abnormal; Notable for the following:    GFR calc non Af Amer 55 (*)    GFR calc Af Amer 64 (*)    All other components within normal limits  URINE CULTURE  TROPONIN I  URINALYSIS, ROUTINE W REFLEX MICROSCOPIC    Imaging Review Dg Chest 2 View  12/24/2013   CLINICAL DATA:  Headache and dizziness  EXAM: CHEST  2 VIEW  COMPARISON:  June 13, 2012  FINDINGS: There is no edema or consolidation. Heart size and pulmonary vascularity are normal. No adenopathy. There is age uncertain anterior wedging of a lower thoracic vertebral body. There are old healed rib fractures on the left.  IMPRESSION: Age uncertain anterior wedging of a lower thoracic vertebral body. No edema or consolidation.   Electronically Signed   By: Lowella Grip M.D.   On: 12/24/2013 12:51   Ct Head Wo Contrast  12/24/2013   CLINICAL DATA:  Headache and dizziness; history of colon carcinoma  EXAM: CT HEAD WITHOUT CONTRAST  TECHNIQUE: Contiguous axial images were obtained from the base of the skull through the vertex without intravenous contrast.  COMPARISON:  June 01, 2010 and June 02, 2010  FINDINGS: There is mild generalized atrophy. There is no mass, hemorrhage, extra-axial fluid collection, or midline shift. There is mild small vessel disease in the centra semiovale bilaterally. No acute appearing infarct is seen. Elsewhere gray-white compartments appear normal. Bony calvarium appears intact. Mastoid air cells are clear.  IMPRESSION: Mild atrophy with mild  periventricular small vessel disease. No intracranial mass, hemorrhage, or acute appearing infarct.   Electronically Signed   By: Lowella Grip M.D.   On: 12/24/2013 12:47     EKG Interpretation   Date/Time:  Sunday December 24 2013 12:32:33 EDT Ventricular Rate:  54 PR Interval:  144 QRS Duration: 81 QT Interval:  424 QTC Calculation: 402 R Axis:   25 Text Interpretation:  Sinus rhythm Probable left atrial enlargement RSR'  in V1 or V2, right VCD or RVH since last tracing no significant change  Confirmed by [facility]  MD, Kataya Guimont ([id number]) on 12/24/2013 1:07:47 PM      MDM   Final diagnoses:  Dizziness    Nonspecific dizziness, with headache, post head injury. The likely diagnosis is concussion without, serious injury. There is no evidence for intracranial bleeding, fracture, sepsis, meningitis  or metabolic instability.   Nursing Notes Reviewed/ Care Coordinated Applicable Imaging Reviewed Interpretation of Laboratory Data incorporated into ED treatment  The patient appears reasonably screened and/or stabilized for discharge and I doubt any other medical condition or other [facility] requiring further screening, evaluation, or treatment in the ED at this time prior to discharge.  Plan: Home Medications- D/C Meclizine, Ativan prn dizziness; Home Treatments- rest, fluids; return here if the recommended treatment, does not improve the symptoms; Recommended follow up- PCP 1 week    Richarda Blade, MD 12/25/13 1157

## 2013-12-24 NOTE — ED Notes (Signed)
Pt reports hx of vertigo. Pt reports 5 weeks ago he was working on a [employer] when the hood fell down and hit him on the head. Possible LOC, did not go to the doctor, dizziness since injury. Reports headache x1 week. Pain 7/10. Reports blurry vision when pain is bad.

## 2013-12-25 LAB — URINE CULTURE
Colony Count: NO GROWTH
Culture: NO GROWTH

## 2014-05-17 ENCOUNTER — Encounter (HOSPITAL_COMMUNITY): Payer: Self-pay | Admitting: *Deleted

## 2014-05-17 ENCOUNTER — Emergency Department (HOSPITAL_COMMUNITY)
Admission: EM | Admit: 2014-05-17 | Discharge: 2014-05-17 | Disposition: A | Discharge status: Home / Self Care | Payer: Medicare Other | Attending: Emergency Medicine | Admitting: Emergency Medicine

## 2014-05-17 DIAGNOSIS — Y9289 Other specified places as the place of occurrence of the external cause: Secondary | ICD-10-CM | POA: Diagnosis not present

## 2014-05-17 DIAGNOSIS — Z8719 Personal history of other diseases of the digestive system: Secondary | ICD-10-CM | POA: Insufficient documentation

## 2014-05-17 DIAGNOSIS — Y9389 Activity, other specified: Secondary | ICD-10-CM | POA: Diagnosis not present

## 2014-05-17 DIAGNOSIS — W228XXA Striking against or struck by other objects, initial encounter: Secondary | ICD-10-CM | POA: Insufficient documentation

## 2014-05-17 DIAGNOSIS — T1591XA Foreign body on external eye, part unspecified, right eye, initial encounter: Secondary | ICD-10-CM

## 2014-05-17 DIAGNOSIS — Y998 Other external cause status: Secondary | ICD-10-CM | POA: Diagnosis not present

## 2014-05-17 DIAGNOSIS — Z8739 Personal history of other diseases of the musculoskeletal system and connective tissue: Secondary | ICD-10-CM | POA: Diagnosis not present

## 2014-05-17 DIAGNOSIS — Z720 Tobacco use: Secondary | ICD-10-CM | POA: Diagnosis not present

## 2014-05-17 DIAGNOSIS — T1501XA Foreign body in cornea, right eye, initial encounter: Secondary | ICD-10-CM | POA: Insufficient documentation

## 2014-05-17 DIAGNOSIS — Z85038 Personal history of other malignant neoplasm of large intestine: Secondary | ICD-10-CM | POA: Diagnosis not present

## 2014-05-17 MED ORDER — ERYTHROMYCIN 5 MG/GM OP OINT
TOPICAL_OINTMENT | Freq: Once | OPHTHALMIC | Status: AC
  Administered 2014-05-17: 16:00:00 via OPHTHALMIC
  Filled 2014-05-17: qty 3.5

## 2014-05-17 MED ORDER — TETRACAINE HCL 0.5 % OP SOLN
2.0000 [drp] | Freq: Once | OPHTHALMIC | Status: AC
  Administered 2014-05-17: 2 [drp] via OPHTHALMIC
  Filled 2014-05-17: qty 2

## 2014-05-17 MED ORDER — FLUORESCEIN SODIUM 1 MG OP STRP
1.0000 | ORAL_STRIP | Freq: Once | OPHTHALMIC | Status: AC
  Administered 2014-05-17: 1 via OPHTHALMIC
  Filled 2014-05-17: qty 1

## 2014-05-17 NOTE — ED Notes (Signed)
Pt given water per request with EDPA verbal ok, pt/family updated on plan, denies further needs/complaints.  NAD.

## 2014-05-17 NOTE — ED Notes (Signed)
Hold on d/c and ointment at this time per EDPA.

## 2014-05-17 NOTE — ED Notes (Signed)
Pt reports he got something in his eye while working a couple of days ago. Pt reports burning, redness, clear drainage to right eye.

## 2014-05-17 NOTE — ED Provider Notes (Signed)
CSN: [id number]     Arrival date & time 05/17/14  1215 History  This chart was scribed for non-physician practitioner Jamse Mead, PA-C working with Jasper Riling. Alvino Chapel, MD by Zola Button, ED Scribe. This patient was seen in room WTR5/WTR5 and the patient's care was started at 1:49 PM.      Chief Complaint  Patient presents with  . Eye Pain    The history is provided by the patient. No language interpreter was used.   HPI Comments: William Patel is a 80 y.o. male with a hx of vertigo, hematuria, colon cancer who presents to the Emergency Department complaining of burning right eye pain with redness and clear drainage that started after something went into his right eye while working 4-5 days ago. At that time, patient was splitting wood without protective eyewear; a piece of wood might have entered his eye, but he is unsure what exactly went into his eye. Patient does state it feels like something is sticking in his eye. He reports having blurred vision, minimal eye swelling, and tingling. He has tried rinsing his eye with water. Patient denies fever and HA. He does not currently see an eye doctor regularly. He sometimes wears reading glasses, but does not wear prescription glasses and normally does not have problems with his vision. His last tetanus was less than 5 years ago. NKDA. PCP: Dr. Arelia Sneddon  Past Medical History  Diagnosis Date  . Vertigo   . Visual disturbances   . Leg swelling occasional    both legs  . Rectal bleeding     small amount, none current  . Hematuria   . Colon cancer     Left   . Generalized headaches    Past Surgical History  Procedure Laterality Date  . Colonoscopy  feb 2013    endo done also  . Colon resection  09/21/2011    Procedure: COLON RESECTION LAPAROSCOPIC;  Surgeon: Pedro Earls, MD;  Location: [facility];  Service: General;  Laterality: N/A;      Family History  Problem Relation Age of Onset  . Heart failure Mother   . Brain cancer Mother    . Brain cancer Father    History  Substance Use Topics  . Smoking status: Current Some Day Smoker    Types: Cigars  . Smokeless tobacco: Current User    Types: Chew  . Alcohol Use: 1.8 oz/week    3 Cans of beer per week    Review of Systems  Constitutional: Negative for fever.  Eyes: Positive for pain, discharge, redness and visual disturbance.  Neurological: Positive for dizziness. Negative for headaches.      Allergies  Tramadol  Home Medications   Prior to Admission medications   Medication Sig Start Date End Date Taking? Authorizing Provider  diphenhydrAMINE (BENADRYL) 25 mg capsule Take 25 mg by mouth every 6 (six) hours as needed. For allergy   Yes Historical Provider, MD  ibuprofen (ADVIL,MOTRIN) 200 MG tablet Take 400 mg by mouth every 6 (six) hours as needed for moderate pain.   Yes Historical Provider, MD  LORazepam (ATIVAN) 0.5 MG tablet Take 1 tablet (0.5 mg total) by mouth every 8 (eight) hours as needed (Dizziness). 12/24/13  Yes Richarda Blade, MD   BP 143/82 mmHg  Pulse 58  Temp(Src) 97.9 F (36.6 C) (Oral)  Resp 16  Ht 5\' 1"  (1.549 m)  Wt 128 lb (58.06 kg)  BMI 24.20 kg/m2  SpO2 98% Physical Exam  Constitutional: He is oriented to person, place, and time. He appears well-developed and well-nourished. No distress.  HENT:  Head: Normocephalic and atraumatic.  Eyes: EOM are normal. Pupils are equal, round, and reactive to light. Right eye exhibits discharge. Right eye exhibits no chemosis, no exudate and no hordeolum. Foreign body present in the right eye. Left eye exhibits no chemosis, no discharge, no exudate and no hordeolum. No foreign body present in the left eye. Right conjunctiva is injected. Right conjunctiva has no hemorrhage. Left conjunctiva is not injected. Left conjunctiva has no hemorrhage. Right eye exhibits normal extraocular motion and no nystagmus. Left eye exhibits normal extraocular motion and no nystagmus.  Fundoscopic exam:      The  right eye shows no arteriolar narrowing, no AV nicking, no exudate, no hemorrhage and no papilledema.       The left eye shows no arteriolar narrowing, no AV nicking, no exudate, no hemorrhage and no papilledema.  Slit lamp exam:      The right eye shows foreign body. The right eye shows no corneal abrasion, no corneal flare, no corneal ulcer, no hyphema and no hypopyon.       The left eye shows no corneal abrasion, no corneal flare, no corneal ulcer, no foreign body, no hyphema and no hypopyon.  Negative signs of hyphema Negative bulging of the anterior chamber bleeding into the anterior chamber Negative drainage of globe or vitreous humor  Foreign body identified, punctate, to the right cornea Moderate chemosis noted to the right eye EOMs intact with mild discomfort to the right eye Negative signs of entrapement  Neck: Normal range of motion. Neck supple. No tracheal deviation present.  Cardiovascular: Normal rate, regular rhythm and normal heart sounds.  Exam reveals no friction rub.   No murmur heard. Pulmonary/Chest: Effort normal and breath sounds normal. No respiratory distress. He has no wheezes. He has no rales.  Musculoskeletal: Normal range of motion.  Lymphadenopathy:    He has no cervical adenopathy.  Neurological: He is alert and oriented to person, place, and time. No cranial nerve deficit. He exhibits normal muscle tone. Coordination normal.  Skin: Skin is warm and dry. No rash noted. He is not diaphoretic. No erythema.  Psychiatric: He has a normal mood and affect. His behavior is normal. Thought content normal.  Nursing note and vitals reviewed.   ED Course  Procedures  DIAGNOSTIC STUDIES: Oxygen Saturation is 98% on room air, normal by my interpretation.    COORDINATION OF CARE: 2:03 PM-Discussed treatment plan which includes slit lamp exam with pt at bedside and pt agreed to plan.    Labs Review Labs Reviewed - No data to display  Imaging Review No results  found.   EKG Interpretation None        Visual Acuity  Right Eye Distance: 20/70 Left Eye Distance: 20/50 Bilateral Distance: 20/50  Right Eye Near:   Left Eye Near:    Bilateral Near:      2:28 PM This provider was made aware that the ophthalmologist on-call is in surgery and cannot be in contact for a couple of hours.  2:44 PM This provider spoke with Dr. Maylene Roes, on-call physician for ophthalmology. Discussed case in great detail. As per physician, recommended that patient be seen and agrees that form bodies to be removed. Stated that he will be finished at 4:30 PM with surgeries. Recommended patient to be monitored here and for discharge to his office so that he can remove the foreign body  in fully examine the patient.  3:58 PM This provider spoke with Secretary for Dr. Maylene Roes reported that patient;s symptoms have increased - increased tearing and pain with motion to the eye has now begun. Secretary reported that patient should be discharged and brought over for the doctor to see him and that they will get the process started.   MDM   Final diagnoses:  Foreign body in eye, right, initial encounter    Medications  tetracaine (PONTOCAINE) 0.5 % ophthalmic solution 2 drop (2 drops Right Eye Given 05/17/14 1422)  fluorescein ophthalmic strip 1 strip (1 strip Both Eyes Given 05/17/14 1422)  erythromycin ophthalmic ointment ( Right Eye Given 05/17/14 1600)   Filed Vitals:   05/17/14 1230  BP: 143/82  Pulse: 58  Temp: 97.9 F (36.6 C)  TempSrc: Oral  Resp: 16  Height: 5\' 1"  (1.549 m)  Weight: 128 lb (58.06 kg)  SpO2: 98%   I personally performed the services described in this documentation, which was scribed in my presence. The recorded information has been reviewed and is accurate.  Patient presenting to the ED with right eye pain that has been ongoing for approximately 5 days. Patient reported that he was cutting up would without wearing glasses 5 days ago and felt to the  peace what is up in his thigh. Stated he's been having clear drainage, burning sensation and feels a gritty sensation to his side. Reported blurry, hazy addition to the right eye. This provider spoke with on-call ophthalmologist, Dr. Maylene Roes. As per physician recommended patient to be seen in his office. Stated that patient should meet him at 4:30 PM in his office after surgeries. Patient will need specialist to remove foreign body from right cornea. Patient stable, afebrile. Patient not septic appearing. Discharged patient. Patient given erythromycin ointment. Discussed with patient and wife plan for following up with ophthalmologist and going to the ophthalmology office directly after being discharged-patient and wife agree to plan of care. Patient stable for discharge.  Jamse Mead, PA-C 05/17/14 1601  Jamse Mead, PA-C 05/17/14 1602  [place] Alvino Chapel, MD 05/20/14 [phone]

## 2014-05-17 NOTE — Discharge Instructions (Signed)
Immediately after discharge please go to Dr. Maylene Roes office - he is expecting you. Please immediately go to his office after being discharged for removal of foreign body to right eye.  Location is [address] in the [place] - first floor [address].    Eye, Foreign Body The term foreign body refers to any object near, on the surface of or in the eye that should not be there. A foreign body may be a small speck of dirt or dust, a hair or eyelash, a splinter or any object. CAUSES  Foreign bodies can get in the eye by:  Flying pieces of something that was broken or destroyed (debris).  A sudden injury (trauma) to the eye. SYMPTOMS  Symptoms depend on what the foreign body is and where it is in the eye. The most common locations are:  On the inner surface of the upper or lower eyelids or on the covering of the white part of the eye (conjunctiva). Symptoms in this location are:  Irritating and painful, especially when blinking.  Feeling like something is in the eye.  On the surface of the clear covering on the front of the eye (cornea). A corneal foreign body has symptoms that:  Are painful and irritating since the cornea is very sensitive.  Form small "rust rings" around a metallic foreign body. Metallic foreign bodies stick more firmly to the surface of the cornea.  Inside the eyeball. Infection can happen fast and can be hard to treat with antibiotics. This is an extremely dangerous situation. Foreign bodies inside the eye can threaten vision. A person may even loose their eye. Foreign bodies inside the eye may cause:  Great pain.  Immediate loss of vision. DIAGNOSIS  Foreign bodies are found during an exam by an eye specialist. Those that are on the eyelids, conjunctiva or cornea are usually (but not always) easily found. When a foreign body is inside the eyeball, a cataract may form almost right away. This makes it hard for an ophthalmologist to find the  foreign body. Special tests may be needed, including ultrasound testing, X-rays and CT scans. TREATMENT   Foreign bodies that are on the eyelids, conjunctiva or cornea are often removed easily and painlessly.  If the foreign body has caused a scratch or abrasion of the cornea, antibiotic drops, ointments and/or a tight patch called a "pressure patch" may be needed. Follow-up exams will be needed for several days until the abrasion heals.  Surgery is needed right away if the foreign body is inside the eyeball. This is a medical emergency. An antibiotic therapy will likely be given to stop an infection. HOME CARE INSTRUCTIONS  The use of eye patches is not universal. Their use varies from state to state and from caregiver to caregiver. If an eye patch was applied:  Keep the eye patch on for as long as directed by your caregiver until the follow-up appointment.  Do not remove the patch to put in medications unless instructed to do so. When replacing the patch, retape it as it was before. Follow the same procedure if the patch becomes loose.  WARNING: Do not drive or operate machinery while the eye is patched. The ability to judge distances will be impaired.  Only take over-the-counter or prescription medicines for pain, discomfort or fever as directed by the caregiver. If no eye patch was applied:  Keep the eye closed as much as possible. Do not rub the eye.  Wear dark glasses  as needed to protect the eyes from bright light.  Do not wear contact lenses until the eye feels normal again, or as instructed.  Wear protective eye covering if there is a risk of eye injury. This is important when working with high speed tools.  Only take over-the-counter or prescription medicines for pain, discomfort or fever as directed by the caregiver. SEEK IMMEDIATE MEDICAL CARE IF:   Pain increases in the eye or the vision changes.  You or your child has problems with the eye patch.  The injury to the  eye appears to be getting larger.  There is discharge from the injured eye.  Swelling and/or soreness (inflammation) develops around the affected eye.  You or your child has an oral temperature above 102 F (38.9 C), not controlled by medicine.  Your baby is older than 3 months with a rectal temperature of 102 F (38.9 C) or higher.  Your baby is 62 months old or younger with a rectal temperature of 100.4 F (38 C) or higher. MAKE SURE YOU:   Understand these instructions.  Will watch your condition.  Will get help right away if you are not doing well or get worse. Document Released: 05/18/2005 Document Revised: 08/10/2011 Document Reviewed: 10/13/2012 [facility] Patient Information 2015 [place], [place]. This information is not intended to replace advice given to you by your health care provider. Make sure you discuss any questions you have with your health care provider.

## 2014-09-16 ENCOUNTER — Emergency Department (HOSPITAL_COMMUNITY)
Admission: EM | Admit: 2014-09-16 | Discharge: 2014-09-16 | Disposition: A | Discharge status: Home / Self Care | Payer: PPO | Attending: Emergency Medicine | Admitting: Emergency Medicine

## 2014-09-16 ENCOUNTER — Encounter (HOSPITAL_COMMUNITY): Payer: Self-pay

## 2014-09-16 ENCOUNTER — Emergency Department (HOSPITAL_COMMUNITY): Payer: PPO

## 2014-09-16 DIAGNOSIS — R200 Anesthesia of skin: Secondary | ICD-10-CM | POA: Diagnosis not present

## 2014-09-16 DIAGNOSIS — Z79899 Other long term (current) drug therapy: Secondary | ICD-10-CM | POA: Diagnosis not present

## 2014-09-16 DIAGNOSIS — M545 Low back pain: Secondary | ICD-10-CM | POA: Diagnosis not present

## 2014-09-16 DIAGNOSIS — Z8719 Personal history of other diseases of the digestive system: Secondary | ICD-10-CM | POA: Diagnosis not present

## 2014-09-16 DIAGNOSIS — Z85038 Personal history of other malignant neoplasm of large intestine: Secondary | ICD-10-CM | POA: Insufficient documentation

## 2014-09-16 DIAGNOSIS — M25551 Pain in right hip: Secondary | ICD-10-CM | POA: Diagnosis present

## 2014-09-16 DIAGNOSIS — Z720 Tobacco use: Secondary | ICD-10-CM | POA: Insufficient documentation

## 2014-09-16 DIAGNOSIS — Z8669 Personal history of other diseases of the nervous system and sense organs: Secondary | ICD-10-CM | POA: Insufficient documentation

## 2014-09-16 DIAGNOSIS — M25559 Pain in unspecified hip: Secondary | ICD-10-CM

## 2014-09-16 MED ORDER — LIDOCAINE 5 % EX PTCH: 1.0000 | MEDICATED_PATCH | CUTANEOUS | Status: DC

## 2014-09-16 NOTE — ED Notes (Signed)
He c/o non-traumatic right low back/hip area pain x 6 weeks.  He is in no distress.

## 2014-09-16 NOTE — ED Provider Notes (Signed)
Complains of right hip pain and pain at the sacral area for 2 months, worse today. No fall no injury. No fever. No other complaint. Pain worse with walking improved with rest. He is treated himself with Aleve with partial relief. On exam alert no distress. He has note pain with internal or external rotation of thigh. Suspect degenerative arthritis  Orlie Dakin, MD 09/16/14 1444

## 2014-09-16 NOTE — Discharge Instructions (Signed)
Arthritis, Nonspecific °Arthritis is inflammation of a joint. This usually means pain, redness, warmth or swelling are present. One or more joints may be involved. There are a number of types of arthritis. Your caregiver may not be able to tell what type of arthritis you have right away. °CAUSES  °The most common cause of arthritis is the wear and tear on the joint (osteoarthritis). This causes damage to the cartilage, which can break down over time. The knees, hips, back and neck are most often affected by this type of arthritis. °Other types of arthritis and common causes of joint pain include: °· Sprains and other injuries near the joint. Sometimes minor sprains and injuries cause pain and swelling that develop hours later. °· Rheumatoid arthritis. This affects hands, feet and knees. It usually affects both sides of your body at the same time. It is often associated with chronic ailments, fever, weight loss and general weakness. °· Crystal arthritis. Gout and pseudo gout can cause occasional acute severe pain, redness and swelling in the foot, ankle, or knee. °· Infectious arthritis. Bacteria can get into a joint through a break in overlying skin. This can cause infection of the joint. Bacteria and viruses can also spread through the blood and affect your joints. °· Drug, infectious and allergy reactions. Sometimes joints can become mildly painful and slightly swollen with these types of illnesses. °SYMPTOMS  °· Pain is the main symptom. °· Your joint or joints can also be red, swollen and warm or hot to the touch. °· You may have a fever with certain types of arthritis, or even feel overall ill. °· The joint with arthritis will hurt with movement. Stiffness is present with some types of arthritis. °DIAGNOSIS  °Your caregiver will suspect arthritis based on your description of your symptoms and on your exam. Testing may be needed to find the type of arthritis: °· Blood and sometimes urine tests. °· X-ray tests  and sometimes CT or MRI scans. °· Removal of fluid from the joint (arthrocentesis) is done to check for bacteria, crystals or other causes. Your caregiver (or a specialist) will numb the area over the joint with a local anesthetic, and use a needle to remove joint fluid for examination. This procedure is only minimally uncomfortable. °· Even with these tests, your caregiver may not be able to tell what kind of arthritis you have. Consultation with a specialist (rheumatologist) may be helpful. °TREATMENT  °Your caregiver will discuss with you treatment specific to your type of arthritis. If the specific type cannot be determined, then the following general recommendations may apply. °Treatment of severe joint pain includes: °· Rest. °· Elevation. °· Anti-inflammatory medication (for example, ibuprofen) may be prescribed. Avoiding activities that cause increased pain. °· Only take over-the-counter or prescription medicines for pain and discomfort as recommended by your caregiver. °· Cold packs over an inflamed joint may be used for 10 to 15 minutes every hour. Hot packs sometimes feel better, but do not use overnight. Do not use hot packs if you are diabetic without your caregiver's permission. °· A cortisone shot into arthritic joints may help reduce pain and swelling. °· Any acute arthritis that gets worse over the next 1 to 2 days needs to be looked at to be sure there is no joint infection. °Long-term arthritis treatment involves modifying activities and lifestyle to reduce joint stress jarring. This can include weight loss. Also, exercise is needed to nourish the joint cartilage and remove waste. This helps keep the muscles   around the joint strong. °HOME CARE INSTRUCTIONS  °· Do not take aspirin to relieve pain if gout is suspected. This elevates uric acid levels. °· Only take over-the-counter or prescription medicines for pain, discomfort or fever as directed by your caregiver. °· Rest the joint as much as  possible. °· If your joint is swollen, keep it elevated. °· Use crutches if the painful joint is in your leg. °· Drinking plenty of fluids may help for certain types of arthritis. °· Follow your caregiver's dietary instructions. °· Try low-impact exercise such as: °¨ Swimming. °¨ Water aerobics. °¨ Biking. °¨ Walking. °· Morning stiffness is often relieved by a warm shower. °· Put your joints through regular range-of-motion. °SEEK MEDICAL CARE IF:  °· You do not feel better in 24 hours or are getting worse. °· You have side effects to medications, or are not getting better with treatment. °SEEK IMMEDIATE MEDICAL CARE IF:  °· You have a fever. °· You develop severe joint pain, swelling or redness. °· Many joints are involved and become painful and swollen. °· There is severe back pain and/or leg weakness. °· You have loss of bowel or bladder control. °Document Released: 06/25/2004 Document Revised: 08/10/2011 Document Reviewed: 07/11/2008 °ExitCare® Patient Information ©2015 [employer]. This information is not intended to replace advice given to you by your health care provider. Make sure you discuss any questions you have with your health care provider. ° °

## 2014-09-16 NOTE — ED Provider Notes (Signed)
CSN: [id number]     Arrival date & time 09/16/14  1304 History  This chart was scribed for non-physician practitioner, Margarita Mail, PA-C, working with Orlie Dakin, MD, by Peyton Bottoms ED Scribe. This patient was seen in room WTR6/WTR6 and the patient's care was started at 2:28 PM    Chief Complaint  Patient presents with  . Hip Pain   Patient is a 81 y.o. male presenting with hip pain. The history is provided by the patient. No language interpreter was used.  Hip Pain This is a new problem. The current episode started more than 1 week ago. The problem occurs constantly. Pertinent negatives include no chest pain, no abdominal pain, no headaches and no shortness of breath. Nothing aggravates the symptoms. Nothing relieves the symptoms. He has tried nothing for the symptoms.   HPI Comments: William Patel is a 80 y.o. male with a PMHx of vertigo, visual disturbances, bilateral leg swelling, rectal bleeding, hematuria, and colon cancer, who presents to the Emergency Department complaining of moderate right hip pain that initially began 2 months ago. He states that the pain is exacerbated when standing up from a sitting position. He reports intermittent numbness to right hand. Patient denies associated groin poin. Patient states he takes Aleve at home with no relief. Patient is seen by PCP Dr. Arelia Sneddon.  Past Medical History  Diagnosis Date  . Vertigo   . Visual disturbances   . Leg swelling occasional    both legs  . Rectal bleeding     small amount, none current  . Hematuria   . Colon cancer     Left   . Generalized headaches    Past Surgical History  Procedure Laterality Date  . Colonoscopy  feb 2013    endo done also  . Colon resection  09/21/2011    Procedure: COLON RESECTION LAPAROSCOPIC;  Surgeon: Pedro Earls, MD;  Location: [facility];  Service: General;  Laterality: N/A;      Family History  Problem Relation Age of Onset  . Heart failure Mother   . Brain cancer  Mother   . Brain cancer Father    History  Substance Use Topics  . Smoking status: Current Some Day Smoker    Types: Cigars  . Smokeless tobacco: Current User    Types: Chew  . Alcohol Use: 1.8 oz/week    3 Cans of beer per week   Review of Systems  Respiratory: Negative for shortness of breath.   Cardiovascular: Negative for chest pain.  Gastrointestinal: Negative for abdominal pain.  Musculoskeletal: Positive for myalgias.       Right hip pain  Neurological: Positive for numbness. Negative for headaches.  All other systems reviewed and are negative.  Allergies  Tramadol  Home Medications   Prior to Admission medications   Medication Sig Start Date End Date Taking? Authorizing Provider  diphenhydrAMINE (BENADRYL) 25 mg capsule Take 25 mg by mouth every 6 (six) hours as needed. For allergy    Historical Provider, MD  ibuprofen (ADVIL,MOTRIN) 200 MG tablet Take 400 mg by mouth every 6 (six) hours as needed for moderate pain.    Historical Provider, MD  LORazepam (ATIVAN) 0.5 MG tablet Take 1 tablet (0.5 mg total) by mouth every 8 (eight) hours as needed (Dizziness). 12/24/13   Daleen Bo, MD   Triage Vitals: BP 150/87 mmHg  Pulse 77  Temp(Src) 98.2 F (36.8 C) (Oral)  Resp 18  SpO2 98%  Physical Exam  Constitutional: He  is oriented to person, place, and time. He appears well-developed and well-nourished. No distress.  HENT:  Head: Normocephalic and atraumatic.  Cardiovascular: Normal rate.   Pulmonary/Chest: Effort normal. No respiratory distress.  Abdominal: There is no tenderness.  Musculoskeletal: He exhibits tenderness. He exhibits no edema.  Lumbar paraspinal tenderness. Tender in gluteal muscles.  Neurological: He is alert and oriented to person, place, and time. No cranial nerve deficit. Coordination normal.  Skin: No rash noted. He is not diaphoretic.  Psychiatric: He has a normal mood and affect. His behavior is normal.  Nursing note and vitals  reviewed.  ED Course  Procedures (including critical care time)  DIAGNOSTIC STUDIES: Oxygen Saturation is 98% on RA, normal by my interpretation.    COORDINATION OF CARE: 2:33 PM- Order diagnostic imaging of right hip and pelvis. Discussed plans to  Pt advised of plan for treatment and pt agrees.  Labs Review Labs Reviewed - No data to display  Imaging Review Dg Hip Unilat With Pelvis 2-3 Views Right  09/16/2014   CLINICAL DATA:  Patient with nontraumatic right posterior hip pain for 6 weeks. Pain radiates anteriorly through the right leg. No known arthritis.  EXAM: RIGHT HIP (WITH PELVIS) 2-3 VIEWS  COMPARISON:  None.  FINDINGS: Examination limited due to overlying soft tissues. Normal anatomic alignment. No evidence for acute fracture. Minimal right hip joint degenerative changes. Right hemipelvis phleboliths.  IMPRESSION: Mild right hip joint degenerative change. No acute osseous abnormality.   Electronically Signed   By: Lovey Newcomer M.D.   On: 09/16/2014 13:57    EKG Interpretation None     MDM   Final diagnoses:  Hip pain    Patient with apparent arthritis of the hip.no injuries Patient seen in shared visit with attending physician. Discharge with tylenol. F/u with ortho  I personally performed the services described in this documentation, which was scribed in my presence. The recorded information has been reviewed and is accurate.    s  Margarita Mail, PA-C 09/24/14 1927  Orlie Dakin, MD 09/25/14 [phone]

## 2016-03-11 DIAGNOSIS — J029 Acute pharyngitis, unspecified: Secondary | ICD-10-CM | POA: Diagnosis not present

## 2016-03-11 DIAGNOSIS — I10 Essential (primary) hypertension: Secondary | ICD-10-CM | POA: Diagnosis not present

## 2016-03-11 DIAGNOSIS — Z23 Encounter for immunization: Secondary | ICD-10-CM | POA: Diagnosis not present

## 2016-03-18 DIAGNOSIS — I10 Essential (primary) hypertension: Secondary | ICD-10-CM | POA: Diagnosis not present

## 2016-03-27 ENCOUNTER — Emergency Department (HOSPITAL_COMMUNITY): Payer: PPO

## 2016-03-27 ENCOUNTER — Observation Stay (HOSPITAL_COMMUNITY)
Admission: EM | Admit: 2016-03-27 | Discharge: 2016-03-28 | Disposition: A | Discharge status: Home / Self Care | Payer: PPO | Attending: Internal Medicine | Admitting: Internal Medicine

## 2016-03-27 ENCOUNTER — Encounter (HOSPITAL_COMMUNITY): Payer: Self-pay | Admitting: Nurse Practitioner

## 2016-03-27 DIAGNOSIS — N400 Enlarged prostate without lower urinary tract symptoms: Secondary | ICD-10-CM | POA: Diagnosis not present

## 2016-03-27 DIAGNOSIS — K5730 Diverticulosis of large intestine without perforation or abscess without bleeding: Secondary | ICD-10-CM | POA: Diagnosis not present

## 2016-03-27 DIAGNOSIS — I5032 Chronic diastolic (congestive) heart failure: Secondary | ICD-10-CM | POA: Diagnosis not present

## 2016-03-27 DIAGNOSIS — H539 Unspecified visual disturbance: Secondary | ICD-10-CM | POA: Insufficient documentation

## 2016-03-27 DIAGNOSIS — Z85038 Personal history of other malignant neoplasm of large intestine: Secondary | ICD-10-CM | POA: Insufficient documentation

## 2016-03-27 DIAGNOSIS — R55 Syncope and collapse: Secondary | ICD-10-CM | POA: Diagnosis not present

## 2016-03-27 DIAGNOSIS — N179 Acute kidney failure, unspecified: Secondary | ICD-10-CM | POA: Diagnosis not present

## 2016-03-27 DIAGNOSIS — I6782 Cerebral ischemia: Secondary | ICD-10-CM | POA: Diagnosis not present

## 2016-03-27 DIAGNOSIS — R1084 Generalized abdominal pain: Secondary | ICD-10-CM | POA: Diagnosis not present

## 2016-03-27 DIAGNOSIS — I130 Hypertensive heart and chronic kidney disease with heart failure and stage 1 through stage 4 chronic kidney disease, or unspecified chronic kidney disease: Secondary | ICD-10-CM | POA: Insufficient documentation

## 2016-03-27 DIAGNOSIS — Z885 Allergy status to narcotic agent status: Secondary | ICD-10-CM | POA: Diagnosis not present

## 2016-03-27 DIAGNOSIS — I672 Cerebral atherosclerosis: Secondary | ICD-10-CM | POA: Diagnosis not present

## 2016-03-27 DIAGNOSIS — R51 Headache: Secondary | ICD-10-CM | POA: Diagnosis not present

## 2016-03-27 DIAGNOSIS — K4090 Unilateral inguinal hernia, without obstruction or gangrene, not specified as recurrent: Secondary | ICD-10-CM | POA: Insufficient documentation

## 2016-03-27 DIAGNOSIS — N182 Chronic kidney disease, stage 2 (mild): Secondary | ICD-10-CM | POA: Diagnosis present

## 2016-03-27 DIAGNOSIS — R519 Headache, unspecified: Secondary | ICD-10-CM

## 2016-03-27 DIAGNOSIS — N1831 Chronic kidney disease, stage 3a: Secondary | ICD-10-CM | POA: Diagnosis present

## 2016-03-27 DIAGNOSIS — E860 Dehydration: Secondary | ICD-10-CM | POA: Diagnosis not present

## 2016-03-27 DIAGNOSIS — R109 Unspecified abdominal pain: Secondary | ICD-10-CM | POA: Diagnosis not present

## 2016-03-27 DIAGNOSIS — I10 Essential (primary) hypertension: Secondary | ICD-10-CM | POA: Diagnosis present

## 2016-03-27 DIAGNOSIS — F17290 Nicotine dependence, other tobacco product, uncomplicated: Secondary | ICD-10-CM | POA: Insufficient documentation

## 2016-03-27 DIAGNOSIS — K7689 Other specified diseases of liver: Secondary | ICD-10-CM | POA: Insufficient documentation

## 2016-03-27 LAB — URINALYSIS, ROUTINE W REFLEX MICROSCOPIC
Bilirubin Urine: NEGATIVE
Glucose, UA: NEGATIVE mg/dL
Hgb urine dipstick: NEGATIVE
Ketones, ur: NEGATIVE mg/dL
Leukocytes, UA: NEGATIVE
Nitrite: NEGATIVE
Protein, ur: NEGATIVE mg/dL
Specific Gravity, Urine: 1.018 (ref 1.005–1.030)
pH: 7 (ref 5.0–8.0)

## 2016-03-27 LAB — COMPREHENSIVE METABOLIC PANEL
ALBUMIN: 4.2 g/dL (ref 3.5–5.0)
ALK PHOS: 53 U/L (ref 38–126)
ALT: 18 U/L (ref 17–63)
AST: 27 U/L (ref 15–41)
Anion gap: 12 (ref 5–15)
BILIRUBIN TOTAL: 0.9 mg/dL (ref 0.3–1.2)
BUN: 40 mg/dL — ABNORMAL HIGH (ref 6–20)
CALCIUM: 10.2 mg/dL (ref 8.9–10.3)
CO2: 24 mmol/L (ref 22–32)
Chloride: 100 mmol/L — ABNORMAL LOW (ref 101–111)
Creatinine, Ser: 1.73 mg/dL — ABNORMAL HIGH (ref 0.61–1.24)
GFR calc Af Amer: 41 mL/min — ABNORMAL LOW (ref >60)
GFR calc non Af Amer: 36 mL/min — ABNORMAL LOW (ref >60)
GLUCOSE: 110 mg/dL — AB (ref 65–99)
Potassium: 4 mmol/L (ref 3.5–5.1)
Sodium: 136 mmol/L (ref 135–145)
TOTAL PROTEIN: 6.9 g/dL (ref 6.5–8.1)

## 2016-03-27 LAB — CBC
HCT: 46.6 % (ref 39.0–52.0)
Hemoglobin: 16.5 g/dL (ref 13.0–17.0)
MCH: 32.9 pg (ref 26.0–34.0)
MCHC: 35.4 g/dL (ref 30.0–36.0)
MCV: 92.8 fL (ref 78.0–100.0)
Platelets: 214 10*3/uL (ref 150–400)
RBC: 5.02 MIL/uL (ref 4.22–5.81)
RDW: 12.1 % (ref 11.5–15.5)
WBC: 9.8 10*3/uL (ref 4.0–10.5)

## 2016-03-27 LAB — I-STAT CG4 LACTIC ACID, ED: Lactic Acid, Venous: 1.36 mmol/L (ref 0.5–1.9)

## 2016-03-27 LAB — LIPASE, BLOOD: LIPASE: 52 U/L — AB (ref 11–51)

## 2016-03-27 MED ORDER — HYDROCODONE-ACETAMINOPHEN 5-325 MG PO TABS: 1.0000 | ORAL_TABLET | ORAL | Status: DC | PRN

## 2016-03-27 MED ORDER — SODIUM CHLORIDE 0.9% FLUSH
3.0000 mL | Freq: Two times a day (BID) | INTRAVENOUS | Status: DC
  Administered 2016-03-28: 3 mL via INTRAVENOUS

## 2016-03-27 MED ORDER — SERTRALINE HCL 50 MG PO TABS
25.0000 mg | ORAL_TABLET | Freq: Every day | ORAL | Status: DC
  Administered 2016-03-28: 25 mg via ORAL
  Filled 2016-03-27: qty 1

## 2016-03-27 MED ORDER — FLUTICASONE PROPIONATE 50 MCG/ACT NA SUSP: 2.0000 | Freq: Every evening | NASAL | Status: DC | PRN

## 2016-03-27 MED ORDER — IOPAMIDOL (ISOVUE-300) INJECTION 61%
INTRAVENOUS | Status: AC
  Administered 2016-03-27: 80 mL
  Filled 2016-03-27: qty 100

## 2016-03-27 MED ORDER — POLYETHYLENE GLYCOL 3350 17 G PO PACK: 17.0000 g | PACK | Freq: Every day | ORAL | Status: DC | PRN

## 2016-03-27 MED ORDER — ONDANSETRON HCL 4 MG PO TABS: 4.0000 mg | ORAL_TABLET | Freq: Four times a day (QID) | ORAL | Status: DC | PRN

## 2016-03-27 MED ORDER — SODIUM CHLORIDE 0.9 % IV BOLUS (SEPSIS)
500.0000 mL | Freq: Once | INTRAVENOUS | Status: AC
  Administered 2016-03-27: 500 mL via INTRAVENOUS

## 2016-03-27 MED ORDER — ACETAMINOPHEN 650 MG RE SUPP: 650.0000 mg | Freq: Four times a day (QID) | RECTAL | Status: DC | PRN

## 2016-03-27 MED ORDER — ONDANSETRON HCL 4 MG/2ML IJ SOLN
4.0000 mg | Freq: Four times a day (QID) | INTRAMUSCULAR | Status: DC | PRN
  Administered 2016-03-28: 4 mg via INTRAVENOUS
  Filled 2016-03-27: qty 2

## 2016-03-27 MED ORDER — HEPARIN SODIUM (PORCINE) 5000 UNIT/ML IJ SOLN: 5000.0000 [IU] | Freq: Three times a day (TID) | INTRAMUSCULAR | Status: DC

## 2016-03-27 MED ORDER — ACETAMINOPHEN 500 MG PO TABS
1000.0000 mg | ORAL_TABLET | Freq: Once | ORAL | Status: AC
  Administered 2016-03-27: 1000 mg via ORAL
  Filled 2016-03-27: qty 2

## 2016-03-27 MED ORDER — ACETAMINOPHEN 325 MG PO TABS: 650.0000 mg | ORAL_TABLET | Freq: Four times a day (QID) | ORAL | Status: DC | PRN

## 2016-03-27 MED ORDER — SODIUM CHLORIDE 0.9 % IV SOLN
INTRAVENOUS | Status: AC
  Administered 2016-03-28: 01:00:00 via INTRAVENOUS

## 2016-03-27 NOTE — ED Triage Notes (Signed)
Pt presents with c/o abd pain and headaches. He reports onset of symptoms several days ago. Symptoms have been constant since onset, but get worse at times. He reports chills, dizziness. nausea, decreased appetite, increased urinary frequency. He denies fevers, bowel changes. He took [employer] with some relief. He is alert and breathing easily

## 2016-03-27 NOTE — H&P (Signed)
History and Physical    William Patel [id number] DOB: December 04, 1933 DOA: 03/27/2016  PCP: Leonard Downing, MD   Patient coming from: Home  Chief Complaint: Lightheadedness, abdominal pain   HPI: William Patel is a 82 y.o. male with medical history significant for colon cancer status post resection, hypertension, and chronic diastolic CHF who presents to the emergency department with several days of lightheadedness and abdominal discomfort. Patient reports seen his primary care physician approximately one week ago and was started on lisinopril with HCTZ at that time. Patient tolerated the medication well for a couple days, but has since developed insidious and progressive lightheadedness, particularly upon standing, and a vague abdominal discomfort. Abdominal symptoms are described as moderate in intensity, cramping in character, involving the left lower abdomen and left flank, and with no alleviating or exacerbating factors identified. Patient denies any recent fevers or chills and denies vomiting or diarrhea. There has been no chest pain or palpitations and no change in vision or hearing or focal numbness or weakness. Patient reports lightheadedness upon standing, but has not experienced a loss of consciousness or fallen. He also endorses a decreased appetite. He has not attempted any interventions prior to coming into the emergency department.  ED Course: Upon arrival to the ED, patient is found to be afebrile, saturating well on room air, blood pressure 95/70, and vitals otherwise stable. EKG demonstrates a sinus rhythm with low voltage QRS. Chemistry panel is notable for a BUN of 40 and creatinine of 1.73, up from 1.2 previously. CBC is unremarkable and lactic acid is reassuring. Urinalysis is also unremarkable. Noncontrast head CT was obtained and negative for acute intracranial abnormality and a contrast-enhanced abdominal/pelvis CT was obtained and unremarkable. Patient was treated with  acetaminophen in the emergency department and 500 mL of normal saline. He reported some subjective improvement with these measures, remained hemodynamically stable and in no respiratory distress, and will be observed on the telemetry unit for ongoing evaluation and management of near syncope with AKI likely secondary to his new antihypertensive medications.  Review of Systems:  All other systems reviewed and apart from HPI, are negative.  Past Medical History:  Diagnosis Date  . Colon cancer ([place])    Left   . Generalized headaches   . Hematuria   . Hypertension 03/27/2016  . Leg swelling occasional   both legs  . Rectal bleeding    small amount, none current  . Vertigo   . Visual disturbances     Past Surgical History:  Procedure Laterality Date  . COLON RESECTION  09/21/2011   Procedure: COLON RESECTION LAPAROSCOPIC;  Surgeon: Pedro Earls, MD;  Location: [facility];  Service: General;  Laterality: N/A;     . COLONOSCOPY  feb 2013   endo done also     reports that he has been smoking Cigars.  His smokeless tobacco use includes Chew. He reports that he drinks about 1.8 oz of alcohol per week . He reports that he does not use drugs.  Allergies  Allergen Reactions  . Tramadol Other (See Comments)    Causes headaches    Family History  Problem Relation Age of Onset  . Heart failure Mother   . Brain cancer Mother   . Brain cancer Father      Prior to Admission medications   Medication Sig Start Date End Date Taking? Authorizing Provider  fluticasone (FLONASE) 50 MCG/ACT nasal spray Place 2 sprays into both nostrils at bedtime as needed for allergies. 03/11/16  Yes Historical Provider, MD  lisinopril-hydrochlorothiazide (PRINZIDE,ZESTORETIC) 10-12.5 MG tablet Take 1 tablet by mouth daily. 03/11/16  Yes Historical Provider, MD  naproxen sodium (ANAPROX) 220 MG tablet Take 220 mg by mouth 2 (two) times daily as needed (pain).   Yes Historical Provider, MD  sertraline  (ZOLOFT) 25 MG tablet Take 25 mg by mouth daily. 03/18/16  Yes Historical Provider, MD  lidocaine (LIDODERM) 5 % Place 1 patch onto the skin daily. Remove & Discard patch within 12 hours or as directed by MD Patient not taking: Reported on 03/27/2016 09/16/14   Margarita Mail, PA-C  LORazepam (ATIVAN) 0.5 MG tablet Take 1 tablet (0.5 mg total) by mouth every 8 (eight) hours as needed (Dizziness). Patient not taking: Reported on 03/27/2016 12/24/13   Daleen Bo, MD    Physical Exam: Vitals:   03/27/16 2130 03/27/16 2145 03/27/16 2200 03/27/16 2215  BP: 108/76 127/72 116/76 109/73  Pulse: 64 68 (!) 55 (!) 58  Resp: 21 22 20 21   Temp:      TempSrc:      SpO2: 98% 98% 98% 96%      Constitutional: NAD, calm, comfortable Eyes: PERTLA, lids and conjunctivae normal ENMT: Mucous membranes are dry. Posterior pharynx clear of any exudate or lesions.   Neck: normal, supple, no masses, no thyromegaly Respiratory: clear to auscultation bilaterally, no wheezing, no crackles. Normal respiratory effort.   Cardiovascular: S1 & S2 heard, regular rate and rhythm. No extremity edema. No carotid bruits. No significant JVD. Abdomen: No distension, no tenderness, no masses palpated. Bowel sounds normal.  Musculoskeletal: no clubbing / cyanosis. No joint deformity upper and lower extremities. Normal muscle tone.  Skin: no significant rashes, lesions, ulcers. Warm, dry, well-perfused. Poor turgor.  Neurologic: CN 2-12 grossly intact. Sensation intact, DTR normal. Strength 5/5 in all 4 limbs.  Psychiatric: Normal judgment and insight. Alert and oriented x 3. Normal mood and affect.     Labs on Admission: I have personally reviewed following labs and imaging studies  CBC:  Recent Labs Lab 03/27/16 1745  WBC 9.8  HGB 16.5  HCT 46.6  MCV 92.8  PLT [id number]   Basic Metabolic Panel:  Recent Labs Lab 03/27/16 1745  NA 136  K 4.0  CL 100*  CO2 24  GLUCOSE 110*  BUN 40*  CREATININE 1.73*  CALCIUM  10.2   GFR: CrCl cannot be calculated (Unknown ideal weight.). Liver Function Tests:  Recent Labs Lab 03/27/16 1745  AST 27  ALT 18  ALKPHOS 53  BILITOT 0.9  PROT 6.9  ALBUMIN 4.2    Recent Labs Lab 03/27/16 1745  LIPASE 52*   No results for input(s): AMMONIA in the last 168 hours. Coagulation Profile: No results for input(s): INR, PROTIME in the last 168 hours. Cardiac Enzymes: No results for input(s): CKTOTAL, CKMB, CKMBINDEX, TROPONINI in the last 168 hours. BNP (last 3 results) No results for input(s): PROBNP in the last 8760 hours. HbA1C: No results for input(s): HGBA1C in the last 72 hours. CBG: No results for input(s): GLUCAP in the last 168 hours. Lipid Profile: No results for input(s): CHOL, HDL, LDLCALC, TRIG, CHOLHDL, LDLDIRECT in the last 72 hours. Thyroid Function Tests: No results for input(s): TSH, T4TOTAL, FREET4, T3FREE, THYROIDAB in the last 72 hours. Anemia Panel: No results for input(s): VITAMINB12, FOLATE, FERRITIN, TIBC, IRON, RETICCTPCT in the last 72 hours. Urine analysis:    Component Value Date/Time   COLORURINE YELLOW 03/27/2016 [place] 03/27/2016 1754  LABSPEC 1.018 03/27/2016 1754   PHURINE 7.0 03/27/2016 1754   GLUCOSEU NEGATIVE 03/27/2016 1754   HGBUR NEGATIVE 03/27/2016 1754   BILIRUBINUR NEGATIVE 03/27/2016 1754   KETONESUR NEGATIVE 03/27/2016 1754   PROTEINUR NEGATIVE 03/27/2016 1754   UROBILINOGEN 0.2 12/24/2013 1232   NITRITE NEGATIVE 03/27/2016 1754   LEUKOCYTESUR NEGATIVE 03/27/2016 1754   Sepsis Labs: @LABRCNTIP (procalcitonin:4,lacticidven:4) )No results found for this or any previous visit (from the past 240 hour(s)).   Radiological Exams on Admission: Ct Head Wo Contrast  Result Date: 03/27/2016 CLINICAL DATA:  Headache.  Visual disturbance.  Colon cancer. EXAM: CT HEAD WITHOUT CONTRAST TECHNIQUE: Contiguous axial images were obtained from the base of the skull through the vertex without  intravenous contrast. COMPARISON:  12/24/2013 head CT. FINDINGS: Brain: No evidence of parenchymal hemorrhage or extra-axial fluid collection. No mass lesion, mass effect, or midline shift. No CT evidence of acute infarction. Intracranial atherosclerosis. Nonspecific mild subcortical and periventricular white matter hypodensity, most in keeping with chronic small vessel ischemic change. Cerebral volume is age appropriate. No ventriculomegaly. Vascular: No hyperdense vessel or unexpected calcification. Skull: No evidence of calvarial fracture. Sinuses/Orbits: The visualized paranasal sinuses are essentially clear. Other:  The mastoid air cells are unopacified. IMPRESSION: 1.  No evidence of acute intracranial abnormality. 2. Mild chronic small vessel ischemia. Electronically Signed   By: Ilona Sorrel M.D.   On: 03/27/2016 20:15   Ct Abdomen Pelvis W Contrast  Result Date: 03/27/2016 CLINICAL DATA:  Abdominal pain.  History of colon cancer. EXAM: CT ABDOMEN AND PELVIS WITH CONTRAST TECHNIQUE: Multidetector CT imaging of the abdomen and pelvis was performed using the standard protocol following bolus administration of intravenous contrast. CONTRAST:  65mL ISOVUE-300 IOPAMIDOL (ISOVUE-300) INJECTION 61% COMPARISON:  06/14/2012 FINDINGS: Lower chest:  Unremarkable. Hepatobiliary: Multiple hepatic cysts are again noted. There is no evidence for gallstones, gallbladder wall thickening, or pericholecystic fluid. No intrahepatic or extrahepatic biliary dilation. Pancreas: No focal mass lesion. No dilatation of the main duct. No intraparenchymal cyst. No peripancreatic edema. Spleen: No splenomegaly. No focal mass lesion. Adrenals/Urinary Tract: No adrenal nodule or mass. Kidneys are unremarkable. No evidence for hydroureter. The urinary bladder appears normal for the degree of distention. Stomach/Bowel: Stomach is nondistended. No gastric wall thickening. No evidence of outlet obstruction. Duodenal diverticulum noted.  No small bowel wall thickening. No small bowel dilatation. The terminal ileum is normal. The appendix is normal. Colon anastomosis identified distal transverse colon. Marked diverticular change noted in the sigmoid colon without evidence of diverticulitis. Vascular/Lymphatic: There is abdominal aortic atherosclerosis without aneurysm. There is no gastrohepatic or hepatoduodenal ligament lymphadenopathy. No intraperitoneal or retroperitoneal lymphadenopathy. No pelvic sidewall lymphadenopathy. Reproductive: Prostate gland is enlarged. Other: No intraperitoneal free fluid. Musculoskeletal: Left inguinal hernia contains only fat. Bone windows reveal no worrisome lytic or sclerotic osseous lesions. IMPRESSION: 1. No acute findings in the abdomen or pelvis. Specifically, no findings to explain the patient's history of abdominal pain. 2. Multiple hepatic cysts. 3. Marked sigmoid diverticulosis without diverticulitis. 4. Left inguinal hernia contains only fat. 5. Prostatomegaly. Electronically Signed   By: Misty Stanley M.D.   On: 03/27/2016 20:35    EKG: Independently reviewed. Sinus rhythm, low-voltage QRS  Assessment/Plan  1. Near-syncope   - Likely orthostatic in setting of dehydration with soft BP on arrival; new antihypertensives contributing  - There was no fall, trauma, or LOC  - There is no angina or palpitations and EKG unremarkable  - No focal neurologic deficits - Plan to hold antihypertensives, check  orthostatic vitals, continue IV hydration, monitor on telemetry    2. AKI superimposed on CKD stage II    - SCr 1.73, up from 1.2 remotely - BUN is elevated to 40, pt is dehydrated, and suspect this acute - CT abd with normal appearance to kidneys and ureters  - HCTZ and lisinopril are held on admission - Continue IVF hydration  - Repeat chem panel in am    3. Chronic diastolic CHF   - Pt is dehydrated on arrival  - TTE (06/14/12) with EF 0000000, grade 1 diastolic dysfunction  - Continue  IVF hydration for now - Follow I/Os, repeat chem panel in am    4. Hypertension  - BP soft in ED - Managed with lisinopril and HCTZ at home; these are held on admission  - Monitor and resume treatment as needed   DVT prophylaxis: sq heparin Code Status: Full  Family Communication: Discussed with patient  Disposition Plan: Observe on telemetry Consults called: None Admission status: Observation    Vianne Bulls, MD [facility] Pager [phone]  If 7PM-7AM, please contact night-coverage [email or web address] Password [facility]  03/27/2016, 10:33 PM

## 2016-03-27 NOTE — ED Notes (Signed)
Patient transported to CT 

## 2016-03-27 NOTE — ED Provider Notes (Signed)
[facility] DEPT Provider Note   CSN: [id number] Arrival date & time: 03/27/16  1723     History   Chief Complaint Chief Complaint  Patient presents with  . Abdominal Pain  . Headache    HPI William Patel is a 81 y.o. male.  Patient presents with three days of headache and lightheadedness, worsened when standing. Also notes abdominal cramping over the same time period. No fevers, vomiting, diarrhea, constipation. 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 Recently started on unknown antihypertensive one week ago as well as an SSRI by his PCP. Denies neurological deficits. Notes that he does not typically get headches.   The history is provided by the patient. No language interpreter was used.  Headache   This is a new problem. The current episode started more than 2 days ago. The problem occurs constantly. The problem has not changed since onset.Associated with: Standing up. The pain is located in the frontal region. The quality of the pain is described as dull. The pain is at a severity of 5/10. The pain is moderate. The pain does not radiate. Associated symptoms include near-syncope and nausea. Pertinent negatives include no fever, no chest pressure, no palpitations, no shortness of breath and no vomiting. He has tried nothing for the symptoms. The treatment provided no relief.    Past Medical History:  Diagnosis Date  . Colon cancer ([place])    Left   . Generalized headaches   . Hematuria   . Hypertension 03/27/2016  . Leg swelling occasional   both legs  . Rectal bleeding    small amount, none current  . Vertigo   . Visual disturbances     Patient Active Problem List   Diagnosis Date Noted  . AKI (acute kidney injury) ([place]) 03/27/2016  . CKD (chronic kidney disease), stage II 03/27/2016  . Hypertension 03/27/2016  . Chronic diastolic CHF (congestive heart failure) ([place]) 03/27/2016  . Chest pain, midsternal 06/14/2012  . Abdominal pain- LLQ 06/14/2012  . Near  syncope 06/14/2012  . Vertigo 06/14/2012  . Diverticulitis of sigmoid colon 06/14/2012  . PVC (premature ventricular contraction) 08/13/2011  . Pre-operative cardiovascular examination 08/13/2011  . Colon cancer-proximal descending left colon 08/06/2011  . Tobacco use-chews 08/06/2011    Past Surgical History:  Procedure Laterality Date  . COLON RESECTION  09/21/2011   Procedure: COLON RESECTION LAPAROSCOPIC;  Surgeon: Pedro Earls, MD;  Location: [facility]S;  Service: General;  Laterality: N/A;     . COLONOSCOPY  feb 2013   endo done also       Home Medications    Prior to Admission medications   Medication Sig Start Date End Date Taking? Authorizing Provider  fluticasone (FLONASE) 50 MCG/ACT nasal spray Place 2 sprays into both nostrils at bedtime as needed for allergies. 03/11/16  Yes Historical Provider, MD  lisinopril-hydrochlorothiazide (PRINZIDE,ZESTORETIC) 10-12.5 MG tablet Take 1 tablet by mouth daily. 03/11/16  Yes Historical Provider, MD  naproxen sodium (ANAPROX) 220 MG tablet Take 220 mg by mouth 2 (two) times daily as needed (pain).   Yes Historical Provider, MD  sertraline (ZOLOFT) 25 MG tablet Take 25 mg by mouth daily. 03/18/16  Yes Historical Provider, MD  lidocaine (LIDODERM) 5 % Place 1 patch onto the skin daily. Remove & Discard patch within 12 hours or as directed by MD Patient not taking: Reported on 03/27/2016 09/16/14   Margarita Mail, PA-C  LORazepam (ATIVAN) 0.5 MG tablet Take 1 tablet (0.5 mg total) by mouth  every 8 (eight) hours as needed (Dizziness). Patient not taking: Reported on 03/27/2016 12/24/13   Daleen Bo, MD    Family History Family History  Problem Relation Age of Onset  . Heart failure Mother   . Brain cancer Mother   . Brain cancer Father     Social History Social History  Substance Use Topics  . Smoking status: Current Some Day Smoker    Types: Cigars  . Smokeless tobacco: Current User    Types: Chew  . Alcohol use 1.8  oz/week    3 Cans of beer per week     Allergies   Tramadol   Review of Systems Review of Systems  Constitutional: Negative for fever.  HENT: Negative for congestion and facial swelling.   Respiratory: Negative for shortness of breath.   Cardiovascular: Positive for near-syncope. Negative for chest pain and palpitations.  Gastrointestinal: Positive for abdominal pain and nausea. Negative for constipation, diarrhea and vomiting.  Genitourinary: Negative.   Musculoskeletal: Negative.   Skin: Negative.   Allergic/Immunologic: Negative for immunocompromised state.  Neurological: Positive for light-headedness and headaches. Negative for syncope.  Hematological: Does not bruise/bleed easily.  Psychiatric/Behavioral: Negative.      Physical Exam Updated Vital Signs BP 110/81 (BP Location: Right Arm)   Pulse 65   Temp 98.2 F (36.8 C) (Oral)   Resp 19   Ht 5\' 2"  (1.575 m)   Wt 56.1 kg   SpO2 98%   BMI 22.63 kg/m   Physical Exam  Constitutional: He is oriented to person, place, and time. He appears well-developed and well-nourished. No distress.  HENT:  Head: Normocephalic and atraumatic.  Eyes: Conjunctivae and EOM are normal. Pupils are equal, round, and reactive to light.  Neck: Normal range of motion. Neck supple.  Cardiovascular: Normal rate, regular rhythm and normal heart sounds.  Exam reveals no gallop and no friction rub.   No murmur heard. Pulmonary/Chest: Breath sounds normal. No respiratory distress. He has no wheezes. He has no rales.  Abdominal: Soft. He exhibits no distension and no mass. Bowel sounds are increased. There is generalized tenderness. There is no rebound and no guarding.  Musculoskeletal: He exhibits no edema.  Neurological: He is alert and oriented to person, place, and time.  CN II-XII intact. No facial droop, no aphasia or dysarthria. 5/5 strength of bilateral shoulder flexion/extension, elbow flexion/extension, and grip strength. 5/5 strength  of hip flexion/extension, dorsiflexion/plantarflexion. Sensation intact in all extremities. Normal finger-to-nose bilaterally.  Skin: He is not diaphoretic.     ED Treatments / Results  Labs (all labs ordered are listed, but only abnormal results are displayed) Labs Reviewed  COMPREHENSIVE METABOLIC PANEL - Abnormal; Notable for the following:       Result Value   Chloride 100 (*)    Glucose, Bld 110 (*)    BUN 40 (*)    Creatinine, Ser 1.73 (*)    GFR calc non Af Amer 36 (*)    GFR calc Af Amer 41 (*)    All other components within normal limits  LIPASE, BLOOD - Abnormal; Notable for the following:    Lipase 52 (*)    All other components within normal limits  CBC  URINALYSIS, ROUTINE W REFLEX MICROSCOPIC (NOT AT [facility]  BASIC METABOLIC PANEL  CREATININE, URINE, RANDOM  UREA NITROGEN, URINE  SODIUM, URINE, RANDOM  I-STAT CG4 LACTIC ACID, ED    EKG  EKG Interpretation None       Radiology Ct Head Wo Contrast  Result Date: 03/27/2016 CLINICAL DATA:  Headache.  Visual disturbance.  Colon cancer. EXAM: CT HEAD WITHOUT CONTRAST TECHNIQUE: Contiguous axial images were obtained from the base of the skull through the vertex without intravenous contrast. COMPARISON:  12/24/2013 head CT. FINDINGS: Brain: No evidence of parenchymal hemorrhage or extra-axial fluid collection. No mass lesion, mass effect, or midline shift. No CT evidence of acute infarction. Intracranial atherosclerosis. Nonspecific mild subcortical and periventricular white matter hypodensity, most in keeping with chronic small vessel ischemic change. Cerebral volume is age appropriate. No ventriculomegaly. Vascular: No hyperdense vessel or unexpected calcification. Skull: No evidence of calvarial fracture. Sinuses/Orbits: The visualized paranasal sinuses are essentially clear. Other:  The mastoid air cells are unopacified. IMPRESSION: 1.  No evidence of acute intracranial abnormality. 2. Mild chronic small vessel  ischemia. Electronically Signed   By: Ilona Sorrel M.D.   On: 03/27/2016 20:15   Ct Abdomen Pelvis W Contrast  Result Date: 03/27/2016 CLINICAL DATA:  Abdominal pain.  History of colon cancer. EXAM: CT ABDOMEN AND PELVIS WITH CONTRAST TECHNIQUE: Multidetector CT imaging of the abdomen and pelvis was performed using the standard protocol following bolus administration of intravenous contrast. CONTRAST:  34mL ISOVUE-300 IOPAMIDOL (ISOVUE-300) INJECTION 61% COMPARISON:  06/14/2012 FINDINGS: Lower chest:  Unremarkable. Hepatobiliary: Multiple hepatic cysts are again noted. There is no evidence for gallstones, gallbladder wall thickening, or pericholecystic fluid. No intrahepatic or extrahepatic biliary dilation. Pancreas: No focal mass lesion. No dilatation of the main duct. No intraparenchymal cyst. No peripancreatic edema. Spleen: No splenomegaly. No focal mass lesion. Adrenals/Urinary Tract: No adrenal nodule or mass. Kidneys are unremarkable. No evidence for hydroureter. The urinary bladder appears normal for the degree of distention. Stomach/Bowel: Stomach is nondistended. No gastric wall thickening. No evidence of outlet obstruction. Duodenal diverticulum noted. No small bowel wall thickening. No small bowel dilatation. The terminal ileum is normal. The appendix is normal. Colon anastomosis identified distal transverse colon. Marked diverticular change noted in the sigmoid colon without evidence of diverticulitis. Vascular/Lymphatic: There is abdominal aortic atherosclerosis without aneurysm. There is no gastrohepatic or hepatoduodenal ligament lymphadenopathy. No intraperitoneal or retroperitoneal lymphadenopathy. No pelvic sidewall lymphadenopathy. Reproductive: Prostate gland is enlarged. Other: No intraperitoneal free fluid. Musculoskeletal: Left inguinal hernia contains only fat. Bone windows reveal no worrisome lytic or sclerotic osseous lesions. IMPRESSION: 1. No acute findings in the abdomen or  pelvis. Specifically, no findings to explain the patient's history of abdominal pain. 2. Multiple hepatic cysts. 3. Marked sigmoid diverticulosis without diverticulitis. 4. Left inguinal hernia contains only fat. 5. Prostatomegaly. Electronically Signed   By: Misty Stanley M.D.   On: 03/27/2016 20:35    Procedures Procedures (including critical care time)  Medications Ordered in ED Medications  fluticasone (FLONASE) 50 MCG/ACT nasal spray 2 spray (not administered)  sertraline (ZOLOFT) tablet 25 mg (not administered)  sodium chloride flush (NS) 0.9 % injection 3 mL (not administered)  heparin injection 5,000 Units (not administered)  0.9 %  sodium chloride infusion (not administered)  acetaminophen (TYLENOL) tablet 650 mg (not administered)    Or  acetaminophen (TYLENOL) suppository 650 mg (not administered)  HYDROcodone-acetaminophen (NORCO/VICODIN) 5-325 MG per tablet 1-2 tablet (not administered)  polyethylene glycol (MIRALAX / GLYCOLAX) packet 17 g (not administered)  ondansetron (ZOFRAN) tablet 4 mg (not administered)    Or  ondansetron (ZOFRAN) injection 4 mg (not administered)  iopamidol (ISOVUE-300) 61 % injection (80 mLs  Contrast Given 03/27/16 2001)  acetaminophen (TYLENOL) tablet 1,000 mg (1,000 mg Oral Given 03/27/16 1932)  sodium chloride  0.9 % bolus 500 mL (0 mLs Intravenous Stopped 03/27/16 2326)     Initial Impression / Assessment and Plan / ED Course  I have reviewed the triage vital signs and the nursing notes.  Pertinent labs & imaging results that were available during my care of the patient were reviewed by me and considered in my medical decision making (see chart for details).  Clinical Course    Patient presents with several days of headache, generalized abdominal pain, and lightheadedness on standing. He has a normal neurological examination. Bps on the low side on presentation though still appropriate, otherwise has normal vital signs on presentation with  no fever. No signs of sepsis, low concern for meningitis based on well appearance and normal vital signs. CT head negative for bleed. CT abdomen/pelvis without any acute intra-abdominal findings. Labs reveal an AKI with Cr of 1.7 from baseline of 1.0-1.1, and are otherwise unremarkable. Orthostatics were positive with tachycardic response from 60 to 80 on standing. He was given a small fluid bolus of 500 cc's with mild symptomatic improvement. Will be admitted for observation for AKI, likely due to starting antihypertensive and dehydration though unclear what antihypertensive this was. He is stable and appropriate for floor admission.  Final Clinical Impressions(s) / ED Diagnoses   Final diagnoses:  AKI (acute kidney injury) ([place])  Acute nonintractable headache, unspecified headache type  Generalized abdominal discomfort    New Prescriptions Current Discharge Medication List       Harlin Heys, MD 03/28/16 0030    Quintella Reichert, MD 04/01/16 [phone]

## 2016-03-28 ENCOUNTER — Encounter (HOSPITAL_COMMUNITY): Payer: Self-pay | Admitting: *Deleted

## 2016-03-28 DIAGNOSIS — R55 Syncope and collapse: Secondary | ICD-10-CM | POA: Diagnosis not present

## 2016-03-28 LAB — BASIC METABOLIC PANEL
Anion gap: 8 (ref 5–15)
BUN: 33 mg/dL — AB (ref 6–20)
CALCIUM: 9.1 mg/dL (ref 8.9–10.3)
CO2: 27 mmol/L (ref 22–32)
Chloride: 100 mmol/L — ABNORMAL LOW (ref 101–111)
Creatinine, Ser: 1.59 mg/dL — ABNORMAL HIGH (ref 0.61–1.24)
GFR calc Af Amer: 46 mL/min — ABNORMAL LOW (ref >60)
GFR, EST NON AFRICAN AMERICAN: 39 mL/min — AB (ref >60)
GLUCOSE: 140 mg/dL — AB (ref 65–99)
POTASSIUM: 3.5 mmol/L (ref 3.5–5.1)
Sodium: 135 mmol/L (ref 135–145)

## 2016-03-28 LAB — CREATININE, URINE, RANDOM: Creatinine, Urine: 150.54 mg/dL

## 2016-03-28 LAB — GLUCOSE, CAPILLARY
GLUCOSE-CAPILLARY: 85 mg/dL (ref 65–99)
Glucose-Capillary: 136 mg/dL — ABNORMAL HIGH (ref 65–99)

## 2016-03-28 LAB — SODIUM, URINE, RANDOM: Sodium, Ur: 77 mmol/L

## 2016-03-28 NOTE — Discharge Summary (Signed)
Physician Discharge Summary  William Patel [id number] DOB: 01-27-1934 DOA: 03/27/2016  PCP: Leonard Downing, MD  Admit date: 03/27/2016 Discharge date: 03/28/2016  Admitted From: Home Disposition:  Home  Recommendations for Outpatient Follow-up:  1. Follow up with PCP in 1 week 2. Please obtain BMP in one week   Home Health: No  Equipment/Devices: None   Discharge Condition: Stable CODE STATUS: Full  Diet recommendation: Heart healthy  Brief/Interim Summary: From H&P: William Kellie Hardenis a 82 y.o.malewith medical history significant for colon cancer status post resection, hypertension, and chronic diastolic CHF who presents to the emergency department with several days of lightheadedness and abdominal discomfort. Patient reports seeing his primary care physician approximately one week ago and was started on lisinopril with HCTZ at that time. Patient tolerated the medication well for a couple days, but has since developed insidious and progressive lightheadedness, particularly upon standing, and a vague abdominal discomfort. Chemistry panel is notable for a BUN of 40 and creatinine of 1.73, up from 1.2 previously.  Interim: His lisinopril and HCTZ were held and patient was treated with IV fluids. Creatinine improved during his hospitalization.  Subjective on day of discharge: He is feeling well this morning. Without any complaints. He complains of chronic abdominal pain that radiates to his left flank that has not changed acutely. This is been ongoing. He also has chronic vertigo. He states that since he has been in the hospital, his lightheadedness and dizziness has resolved. He is feeling well today without any other complaints. Fevers, chest pain or shortness of breath. No nausea or vomiting.  Discharge Diagnoses:  Principal Problem:   Near syncope Active Problems:   AKI (acute kidney injury) ([place])   CKD (chronic kidney disease), stage II   Hypertension   Chronic  diastolic CHF (congestive heart failure) ([facility])  Near-syncope  - Likely orthostatic in setting of dehydration with soft BP on arrival; new antihypertensives contributing. There was no fall, trauma, or LOC. There is no angina or palpitations and EKG unremarkable. No focal neurologic deficits. Orthostatic VS negative.  - Symptoms improved with IVF - Will stop lisinopril, HCTZ at time of discharge  AKI on CKD stage II  - Baseline Cr 1.2 remotely. Likely cause of AKI due to dehydration in setting of ACE and HCTZ. CT abd with normal appearance to kidneys and ureters. UA negative.  - Improving with IVF   Chronic diastolic CHF  - Echo (123XX123) with EF 0000000, grade 1 diastolic dysfunction   Hypertension  - BP soft in ED - Managed with lisinopril and HCTZ at home; these are held on admission   Discharge Instructions  Discharge Instructions    Call MD for:  persistant dizziness or light-headedness    Complete by:  As directed    Diet - low sodium heart healthy    Complete by:  As directed    Discharge instructions    Complete by:  As directed    Stop your lisinopril-hydrochlorothiazide.  Follow up with your PCP.  Repeat BMP blood work next week.   Increase activity slowly    Complete by:  As directed        Medication List    STOP taking these medications   lisinopril-hydrochlorothiazide 10-12.5 MG tablet Commonly known as:  PRINZIDE,ZESTORETIC   naproxen sodium 220 MG tablet Commonly known as:  ANAPROX     TAKE these medications   fluticasone 50 MCG/ACT nasal spray Commonly known as:  FLONASE Place 2 sprays into both nostrils at  bedtime as needed for allergies.   lidocaine 5 % Commonly known as:  LIDODERM Place 1 patch onto the skin daily. Remove & Discard patch within 12 hours or as directed by MD   LORazepam 0.5 MG tablet Commonly known as:  ATIVAN Take 1 tablet (0.5 mg total) by mouth every 8 (eight) hours as needed (Dizziness).   sertraline 25 MG  tablet Commonly known as:  ZOLOFT Take 25 mg by mouth daily.      Follow-up Information    Leonard Downing, MD. Schedule an appointment as soon as possible for a visit in 1 week(s).   Specialty:  Family Medicine Contact information: [place] [address] [phone]          Allergies  Allergen Reactions  . Tramadol Other (See Comments)    Causes headaches    Consultations:  None   Procedures/Studies: Ct Head Wo Contrast  Result Date: 03/27/2016 CLINICAL DATA:  Headache.  Visual disturbance.  Colon cancer. EXAM: CT HEAD WITHOUT CONTRAST TECHNIQUE: Contiguous axial images were obtained from the base of the skull through the vertex without intravenous contrast. COMPARISON:  12/24/2013 head CT. FINDINGS: Brain: No evidence of parenchymal hemorrhage or extra-axial fluid collection. No mass lesion, mass effect, or midline shift. No CT evidence of acute infarction. Intracranial atherosclerosis. Nonspecific mild subcortical and periventricular white matter hypodensity, most in keeping with chronic small vessel ischemic change. Cerebral volume is age appropriate. No ventriculomegaly. Vascular: No hyperdense vessel or unexpected calcification. Skull: No evidence of calvarial fracture. Sinuses/Orbits: The visualized paranasal sinuses are essentially clear. Other:  The mastoid air cells are unopacified. IMPRESSION: 1.  No evidence of acute intracranial abnormality. 2. Mild chronic small vessel ischemia. Electronically Signed   By: Ilona Sorrel M.D.   On: 03/27/2016 20:15   Ct Abdomen Pelvis W Contrast  Result Date: 03/27/2016 CLINICAL DATA:  Abdominal pain.  History of colon cancer. EXAM: CT ABDOMEN AND PELVIS WITH CONTRAST TECHNIQUE: Multidetector CT imaging of the abdomen and pelvis was performed using the standard protocol following bolus administration of intravenous contrast. CONTRAST:  44mL ISOVUE-300 IOPAMIDOL (ISOVUE-300) INJECTION 61% COMPARISON:   06/14/2012 FINDINGS: Lower chest:  Unremarkable. Hepatobiliary: Multiple hepatic cysts are again noted. There is no evidence for gallstones, gallbladder wall thickening, or pericholecystic fluid. No intrahepatic or extrahepatic biliary dilation. Pancreas: No focal mass lesion. No dilatation of the main duct. No intraparenchymal cyst. No peripancreatic edema. Spleen: No splenomegaly. No focal mass lesion. Adrenals/Urinary Tract: No adrenal nodule or mass. Kidneys are unremarkable. No evidence for hydroureter. The urinary bladder appears normal for the degree of distention. Stomach/Bowel: Stomach is nondistended. No gastric wall thickening. No evidence of outlet obstruction. Duodenal diverticulum noted. No small bowel wall thickening. No small bowel dilatation. The terminal ileum is normal. The appendix is normal. Colon anastomosis identified distal transverse colon. Marked diverticular change noted in the sigmoid colon without evidence of diverticulitis. Vascular/Lymphatic: There is abdominal aortic atherosclerosis without aneurysm. There is no gastrohepatic or hepatoduodenal ligament lymphadenopathy. No intraperitoneal or retroperitoneal lymphadenopathy. No pelvic sidewall lymphadenopathy. Reproductive: Prostate gland is enlarged. Other: No intraperitoneal free fluid. Musculoskeletal: Left inguinal hernia contains only fat. Bone windows reveal no worrisome lytic or sclerotic osseous lesions. IMPRESSION: 1. No acute findings in the abdomen or pelvis. Specifically, no findings to explain the patient's history of abdominal pain. 2. Multiple hepatic cysts. 3. Marked sigmoid diverticulosis without diverticulitis. 4. Left inguinal hernia contains only fat. 5. Prostatomegaly. Electronically Signed   By: Verda Cumins.D.  On: 03/27/2016 20:35       Discharge Exam: Vitals:   03/28/16 0619 03/28/16 1141  BP: 93/67 (!) 96/55  Pulse: 60 (!) 57  Resp: 19 18  Temp: 97.7 F (36.5 C) 98.1 F (36.7 C)   Vitals:    03/27/16 2315 03/28/16 0019 03/28/16 0619 03/28/16 1141  BP: 100/66 110/81 93/67 (!) 96/55  Pulse: (!) 56 65 60 (!) 57  Resp: 20 19 19 18   Temp:   97.7 F (36.5 C) 98.1 F (36.7 C)  TempSrc:  Oral Oral Oral  SpO2: 97% 98% 99% 96%  Weight:  56.1 kg (123 lb 11.2 oz)    Height:  5\' 2"  (1.575 m)      General: Pt is alert, awake, not in acute distress Cardiovascular: RRR, S1/S2 +, no rubs, no gallops Respiratory: CTA bilaterally, no wheezing, no rhonchi Abdominal: Soft, NT, ND, bowel sounds + Extremities: no edema, no cyanosis    The results of significant diagnostics from this hospitalization (including imaging, microbiology, ancillary and laboratory) are listed below for reference.     Microbiology: No results found for this or any previous visit (from the past 240 hour(s)).   Labs: BNP (last 3 results) No results for input(s): BNP in the last 8760 hours. Basic Metabolic Panel:  Recent Labs Lab 03/27/16 1745 03/28/16 0246  NA 136 135  K 4.0 3.5  CL 100* 100*  CO2 24 27  GLUCOSE 110* 140*  BUN 40* 33*  CREATININE 1.73* 1.59*  CALCIUM 10.2 9.1   Liver Function Tests:  Recent Labs Lab 03/27/16 1745  AST 27  ALT 18  ALKPHOS 53  BILITOT 0.9  PROT 6.9  ALBUMIN 4.2    Recent Labs Lab 03/27/16 1745  LIPASE 52*   No results for input(s): AMMONIA in the last 168 hours. CBC:  Recent Labs Lab 03/27/16 1745  WBC 9.8  HGB 16.5  HCT 46.6  MCV 92.8  PLT 214   Cardiac Enzymes: No results for input(s): CKTOTAL, CKMB, CKMBINDEX, TROPONINI in the last 168 hours. BNP: Invalid input(s): POCBNP CBG:  Recent Labs Lab 03/28/16 0054 03/28/16 0616  GLUCAP 136* 85   D-Dimer No results for input(s): DDIMER in the last 72 hours. Hgb A1c No results for input(s): HGBA1C in the last 72 hours. Lipid Profile No results for input(s): CHOL, HDL, LDLCALC, TRIG, CHOLHDL, LDLDIRECT in the last 72 hours. Thyroid function studies No results for input(s): TSH, T4TOTAL,  T3FREE, THYROIDAB in the last 72 hours.  Invalid input(s): FREET3 Anemia work up No results for input(s): VITAMINB12, FOLATE, FERRITIN, TIBC, IRON, RETICCTPCT in the last 72 hours. Urinalysis    Component Value Date/Time   COLORURINE YELLOW 03/27/2016 [place] 03/27/2016 1754   LABSPEC 1.018 03/27/2016 1754   PHURINE 7.0 03/27/2016 1754   GLUCOSEU NEGATIVE 03/27/2016 1754   HGBUR NEGATIVE 03/27/2016 1754   BILIRUBINUR NEGATIVE 03/27/2016 1754   KETONESUR NEGATIVE 03/27/2016 1754   PROTEINUR NEGATIVE 03/27/2016 1754   UROBILINOGEN 0.2 12/24/2013 1232   NITRITE NEGATIVE 03/27/2016 1754   LEUKOCYTESUR NEGATIVE 03/27/2016 1754   Sepsis Labs Invalid input(s): PROCALCITONIN,  WBC,  LACTICIDVEN Microbiology No results found for this or any previous visit (from the past 240 hour(s)).   Time coordinating discharge: Over 30 minutes  SIGNED:  Dessa Phi, DO [facility] Pager [phone]  If 7PM-7AM, please contact night-coverage [email or web address] Password [email or web address] 03/28/2016, 2:24 PM

## 2016-03-28 NOTE — Progress Notes (Signed)
Pt. discharged home  IV and tele removed. Discharge instructions given son at the bedside. Questions answered.

## 2016-03-29 LAB — UREA NITROGEN, URINE: Urea Nitrogen, Ur: 716 mg/dL

## 2016-11-23 DIAGNOSIS — K209 Esophagitis, unspecified: Secondary | ICD-10-CM | POA: Diagnosis not present

## 2016-11-23 DIAGNOSIS — R1310 Dysphagia, unspecified: Secondary | ICD-10-CM | POA: Diagnosis not present

## 2017-05-12 DIAGNOSIS — B86 Scabies: Secondary | ICD-10-CM | POA: Diagnosis not present

## 2017-08-06 ENCOUNTER — Encounter (HOSPITAL_COMMUNITY): Payer: Self-pay | Admitting: Emergency Medicine

## 2017-08-06 ENCOUNTER — Emergency Department (HOSPITAL_COMMUNITY): Payer: PPO

## 2017-08-06 ENCOUNTER — Inpatient Hospital Stay (HOSPITAL_COMMUNITY)
Admission: EM | Admit: 2017-08-06 | Discharge: 2017-08-10 | DRG: 061 | Disposition: A | Discharge status: Home / Self Care | Payer: PPO | Attending: Neurology | Admitting: Neurology

## 2017-08-06 DIAGNOSIS — I6389 Other cerebral infarction: Secondary | ICD-10-CM | POA: Diagnosis not present

## 2017-08-06 DIAGNOSIS — F17200 Nicotine dependence, unspecified, uncomplicated: Secondary | ICD-10-CM

## 2017-08-06 DIAGNOSIS — I6522 Occlusion and stenosis of left carotid artery: Secondary | ICD-10-CM | POA: Diagnosis present

## 2017-08-06 DIAGNOSIS — R402362 Coma scale, best motor response, obeys commands, at arrival to emergency department: Secondary | ICD-10-CM | POA: Diagnosis present

## 2017-08-06 DIAGNOSIS — R4701 Aphasia: Secondary | ICD-10-CM | POA: Diagnosis not present

## 2017-08-06 DIAGNOSIS — F17210 Nicotine dependence, cigarettes, uncomplicated: Secondary | ICD-10-CM | POA: Diagnosis present

## 2017-08-06 DIAGNOSIS — R402222 Coma scale, best verbal response, incomprehensible words, at arrival to emergency department: Secondary | ICD-10-CM | POA: Diagnosis not present

## 2017-08-06 DIAGNOSIS — R1312 Dysphagia, oropharyngeal phase: Secondary | ICD-10-CM | POA: Diagnosis not present

## 2017-08-06 DIAGNOSIS — I6529 Occlusion and stenosis of unspecified carotid artery: Secondary | ICD-10-CM

## 2017-08-06 DIAGNOSIS — I130 Hypertensive heart and chronic kidney disease with heart failure and stage 1 through stage 4 chronic kidney disease, or unspecified chronic kidney disease: Secondary | ICD-10-CM | POA: Diagnosis not present

## 2017-08-06 DIAGNOSIS — I5030 Unspecified diastolic (congestive) heart failure: Secondary | ICD-10-CM | POA: Diagnosis not present

## 2017-08-06 DIAGNOSIS — F17220 Nicotine dependence, chewing tobacco, uncomplicated: Secondary | ICD-10-CM | POA: Diagnosis not present

## 2017-08-06 DIAGNOSIS — I63412 Cerebral infarction due to embolism of left middle cerebral artery: Principal | ICD-10-CM | POA: Diagnosis present

## 2017-08-06 DIAGNOSIS — R1310 Dysphagia, unspecified: Secondary | ICD-10-CM | POA: Diagnosis present

## 2017-08-06 DIAGNOSIS — I639 Cerebral infarction, unspecified: Secondary | ICD-10-CM | POA: Diagnosis not present

## 2017-08-06 DIAGNOSIS — Z79899 Other long term (current) drug therapy: Secondary | ICD-10-CM

## 2017-08-06 DIAGNOSIS — Z888 Allergy status to other drugs, medicaments and biological substances status: Secondary | ICD-10-CM | POA: Diagnosis not present

## 2017-08-06 DIAGNOSIS — I5032 Chronic diastolic (congestive) heart failure: Secondary | ICD-10-CM | POA: Diagnosis present

## 2017-08-06 DIAGNOSIS — R402142 Coma scale, eyes open, spontaneous, at arrival to emergency department: Secondary | ICD-10-CM | POA: Diagnosis present

## 2017-08-06 DIAGNOSIS — R29708 NIHSS score 8: Secondary | ICD-10-CM | POA: Diagnosis not present

## 2017-08-06 DIAGNOSIS — R29810 Facial weakness: Secondary | ICD-10-CM | POA: Diagnosis present

## 2017-08-06 DIAGNOSIS — Z85038 Personal history of other malignant neoplasm of large intestine: Secondary | ICD-10-CM

## 2017-08-06 DIAGNOSIS — R4020 Unspecified coma: Secondary | ICD-10-CM | POA: Diagnosis not present

## 2017-08-06 DIAGNOSIS — N183 Chronic kidney disease, stage 3 (moderate): Secondary | ICD-10-CM | POA: Diagnosis present

## 2017-08-06 DIAGNOSIS — E785 Hyperlipidemia, unspecified: Secondary | ICD-10-CM

## 2017-08-06 DIAGNOSIS — I371 Nonrheumatic pulmonary valve insufficiency: Secondary | ICD-10-CM | POA: Diagnosis not present

## 2017-08-06 DIAGNOSIS — R531 Weakness: Secondary | ICD-10-CM | POA: Diagnosis not present

## 2017-08-06 DIAGNOSIS — R404 Transient alteration of awareness: Secondary | ICD-10-CM | POA: Diagnosis not present

## 2017-08-06 LAB — I-STAT CHEM 8, ED
BUN: 32 mg/dL — ABNORMAL HIGH (ref 6–20)
CALCIUM ION: 1.07 mmol/L — AB (ref 1.15–1.40)
CHLORIDE: 101 mmol/L (ref 101–111)
Creatinine, Ser: 1.5 mg/dL — ABNORMAL HIGH (ref 0.61–1.24)
GLUCOSE: 144 mg/dL — AB (ref 65–99)
HCT: 48 % (ref 39.0–52.0)
Hemoglobin: 16.3 g/dL (ref 13.0–17.0)
Potassium: 5.6 mmol/L — ABNORMAL HIGH (ref 3.5–5.1)
Sodium: 135 mmol/L (ref 135–145)
TCO2: 25 mmol/L (ref 22–32)

## 2017-08-06 LAB — DIFFERENTIAL
Basophils Absolute: 0 10*3/uL (ref 0.0–0.1)
Basophils Relative: 0 %
EOS PCT: 1 %
Eosinophils Absolute: 0.1 10*3/uL (ref 0.0–0.7)
Lymphocytes Relative: 17 %
Lymphs Abs: 1.3 10*3/uL (ref 0.7–4.0)
MONO ABS: 0.5 10*3/uL (ref 0.1–1.0)
MONOS PCT: 7 %
NEUTROS ABS: 5.8 10*3/uL (ref 1.7–7.7)
Neutrophils Relative %: 75 %

## 2017-08-06 LAB — COMPREHENSIVE METABOLIC PANEL
ALK PHOS: 58 U/L (ref 38–126)
ALT: 13 U/L — ABNORMAL LOW (ref 17–63)
ANION GAP: 10 (ref 5–15)
AST: 24 U/L (ref 15–41)
Albumin: 3.8 g/dL (ref 3.5–5.0)
BILIRUBIN TOTAL: 1 mg/dL (ref 0.3–1.2)
BUN: 20 mg/dL (ref 6–20)
CALCIUM: 9 mg/dL (ref 8.9–10.3)
CO2: 22 mmol/L (ref 22–32)
Chloride: 103 mmol/L (ref 101–111)
Creatinine, Ser: 1.67 mg/dL — ABNORMAL HIGH (ref 0.61–1.24)
GFR, EST AFRICAN AMERICAN: 43 mL/min — AB (ref >60)
GFR, EST NON AFRICAN AMERICAN: 37 mL/min — AB (ref >60)
Glucose, Bld: 171 mg/dL — ABNORMAL HIGH (ref 65–99)
Potassium: 4.1 mmol/L (ref 3.5–5.1)
Sodium: 135 mmol/L (ref 135–145)
TOTAL PROTEIN: 6.2 g/dL — AB (ref 6.5–8.1)

## 2017-08-06 LAB — CBC
HEMATOCRIT: 46.2 % (ref 39.0–52.0)
Hemoglobin: 16 g/dL (ref 13.0–17.0)
MCH: 32.5 pg (ref 26.0–34.0)
MCHC: 34.6 g/dL (ref 30.0–36.0)
MCV: 93.9 fL (ref 78.0–100.0)
Platelets: 147 10*3/uL — ABNORMAL LOW (ref 150–400)
RBC: 4.92 MIL/uL (ref 4.22–5.81)
RDW: 12.7 % (ref 11.5–15.5)
WBC: 7.8 10*3/uL (ref 4.0–10.5)

## 2017-08-06 LAB — PROTIME-INR
INR: 1.01
Prothrombin Time: 13.2 seconds (ref 11.4–15.2)

## 2017-08-06 LAB — APTT: aPTT: 29 seconds (ref 24–36)

## 2017-08-06 LAB — I-STAT TROPONIN, ED: TROPONIN I, POC: 0 ng/mL (ref 0.00–0.08)

## 2017-08-06 LAB — MRSA PCR SCREENING: MRSA BY PCR: NEGATIVE

## 2017-08-06 MED ORDER — NICARDIPINE HCL IN NACL 20-0.86 MG/200ML-% IV SOLN: 0.0000 mg/h | INTRAVENOUS | Status: DC | PRN

## 2017-08-06 MED ORDER — PANTOPRAZOLE SODIUM 40 MG IV SOLR
40.0000 mg | Freq: Every day | INTRAVENOUS | Status: DC
  Administered 2017-08-06 – 2017-08-07 (×2): 40 mg via INTRAVENOUS
  Filled 2017-08-06 (×2): qty 40

## 2017-08-06 MED ORDER — FLUTICASONE PROPIONATE 50 MCG/ACT NA SUSP: 2.0000 | Freq: Every evening | NASAL | Status: DC | PRN

## 2017-08-06 MED ORDER — LIDOCAINE 5 % EX PTCH
1.0000 | MEDICATED_PATCH | CUTANEOUS | Status: DC
Start: 2017-08-07 — End: 2017-08-10
  Administered 2017-08-08 – 2017-08-10 (×2): 1 via TRANSDERMAL
  Filled 2017-08-06 (×4): qty 1

## 2017-08-06 MED ORDER — SENNOSIDES-DOCUSATE SODIUM 8.6-50 MG PO TABS: 1.0000 | ORAL_TABLET | Freq: Every evening | ORAL | Status: DC | PRN

## 2017-08-06 MED ORDER — SERTRALINE HCL 50 MG PO TABS
25.0000 mg | ORAL_TABLET | Freq: Every day | ORAL | Status: DC
  Administered 2017-08-08 – 2017-08-10 (×3): 25 mg via ORAL
  Filled 2017-08-06 (×3): qty 1

## 2017-08-06 MED ORDER — LABETALOL HCL 5 MG/ML IV SOLN: 20.0000 mg | Freq: Once | INTRAVENOUS | Status: DC | PRN

## 2017-08-06 MED ORDER — STROKE: EARLY STAGES OF RECOVERY BOOK
Freq: Once | Status: DC
  Filled 2017-08-06: qty 1

## 2017-08-06 MED ORDER — SODIUM CHLORIDE 0.9 % IV SOLN
INTRAVENOUS | Status: DC
  Administered 2017-08-06: 19:00:00 via INTRAVENOUS

## 2017-08-06 MED ORDER — ALTEPLASE (STROKE) FULL DOSE INFUSION
0.9000 mg/kg | Freq: Once | INTRAVENOUS | Status: AC
  Administered 2017-08-06: 53 mg via INTRAVENOUS
  Filled 2017-08-06: qty 100

## 2017-08-06 MED ORDER — IOPAMIDOL (ISOVUE-370) INJECTION 76%
50.0000 mL | Freq: Once | INTRAVENOUS | Status: AC | PRN
  Administered 2017-08-06: 50 mL via INTRAVENOUS

## 2017-08-06 MED ORDER — ACETAMINOPHEN 160 MG/5ML PO SOLN: 650.0000 mg | ORAL | Status: DC | PRN

## 2017-08-06 MED ORDER — ACETAMINOPHEN 325 MG PO TABS
650.0000 mg | ORAL_TABLET | ORAL | Status: DC | PRN
  Administered 2017-08-09: 650 mg via ORAL
  Filled 2017-08-06 (×2): qty 2

## 2017-08-06 MED ORDER — ACETAMINOPHEN 650 MG RE SUPP: 650.0000 mg | RECTAL | Status: DC | PRN

## 2017-08-06 NOTE — Code Documentation (Signed)
He was with a friend at [facility], after they got in the car he suddenly couldn't move his right arm and couldn't speak. LKW at 1645.  Patient arrived via EMS at 1723 Stat labs and Stat head CT done.  NIHSS 15  Dr Leonel Ramsay at bedside,  Called patient's son.  Obtained consent for TPA.  TPA started at 1745.  CTA done after TPA started.  NIHSS 13  Right arm flaccid,  Right facial droop, Aphasia, mild sensory loss.  Patient making more sounds than on arrivaL, but still unintelligible.  When lying flat patient has trouble managing his secretions.  Deferred Swallow screen.  Report given by ED RN to ICU RN. Transported to [phone]

## 2017-08-06 NOTE — Progress Notes (Addendum)
Pharmacist Code Stroke Response  Notified to mix tPA at 1736 by Dr. Leonel Ramsay Delivered tPA to RN at 1740  Issues/delays encountered (if applicable): Dr. Leonel Ramsay received emergency consent from Dr. Billy Fischer; foley placement  William Patel 08/06/17 5:40 PM

## 2017-08-06 NOTE — ED Triage Notes (Signed)
Pt BIB [facility], sudden onset aphasia, right arm/leg weakness/drift, right sided facial droop. CBG 202, BP 150/90.

## 2017-08-06 NOTE — Consult Note (Signed)
Referring Physician: Gareth Morgan, MD    Chief Complaint: Right sided weakness with dysphagia  HPI: William Patel is an 84 y.o. male  with medical history significant for colon cancer status post resection, hypertension, CKD III and chronic diastolic CHF who presents to the ED with acute onset right sided weakness and aphasia.  Patient was in his usual state of health, at baseline ambulatory without assistance.  He was at the [facility] with his family got into the car to drive home and in the parking lot started having acute right-sided weakness, inability to move his right side with this aphasia.  EMS was immediately called, and on arrival patient states right-sided symptoms continue to persist with a right facial droop blood pressure 150/90.  On admission to the ER, patient was aphasic attempting to mumble, with right facial droop and some drooling.  Right arm no effort against gravity but able to move bilateral lower extremities equally with mild right lower extremity weakness.  Patient appears to have visual fields intact and initial NIH score was 11  Immediate CT head showed disproportionately dense LEFT M2 versus M3 MCA, possible thromboembolism in a background of probable hemoconcentration.  Family was called by Dr. Leonel Ramsay who was able to speak to patient's son, received implied consent and patient given immediate TPA  At 1745pm.  CTA of the head showed paucity of distal LEFT MCA vessels associated with acute small LEFT frontoparietal/MCA territory nonhemorrhagic infarct.  CTA Neck: LEFT internal carotid artery 1 cm intimal hematoma at risk for embolization as well as severe bilateral C4-5 neural foraminal narrowing.  Patient will be admitted to ICU post TPA for more critical neuro monitoring **Unable to accurately obtain HPI and review of systems secondary to aphasia-patient remained high patient being on blood thinners or having recent surgery   LSN: 08/06/17 - 1645 tPA Given: Yes  at 1745  Premorbid modified Rankin scale (mRS): 0   Past Medical History:  Diagnosis Date  . Colon cancer ([place])    Left   . Generalized headaches   . Hematuria   . Hypertension 03/27/2016  . Leg swelling occasional   both legs  . Rectal bleeding    small amount, none current  . Vertigo   . Visual disturbances     Past Surgical History:  Procedure Laterality Date  . COLON RESECTION  09/21/2011   Procedure: COLON RESECTION LAPAROSCOPIC;  Surgeon: Pedro Earls, MD;  Location: [facility];  Service: General;  Laterality: N/A;     . COLONOSCOPY  feb 2013   endo done also    Family History  Problem Relation Age of Onset  . Heart failure Mother   . Brain cancer Mother   . Brain cancer Father    Social History:  reports that he has been smoking cigars.  His smokeless tobacco use includes chew. He reports that he drinks about 1.8 oz of alcohol per week. He reports that he does not use drugs.  Allergies:  Allergies  Allergen Reactions  . Tramadol Other (See Comments)    Causes headaches    Medications:   ROS: Unable to accurately obtain  ROS secondary to aphasia  Physical Examination: Weight 58.6 kg (129 lb 3 oz). HEENT-  Normocephalic, no lesions, without obvious abnormality.  Normal external eye and conjunctiva.   Cardiovascular- S1-S2 audible, pulses palpable throughout   Lungs-no rhonchi or wheezing noted, no excessive working breathing.  Saturations within normal limits Abdomen- All 4 quadrants palpated and nontender Musculoskeletal-no  joint tenderness, deformity or swelling Skin-warm and dry,   Neurological Examination Mental Status: Alert, oriented, moderate to severe aphasia. Able to nod answers and  able to follow 3 step commands without difficulty. Cranial Nerves: LO:VFIEPP fields intact III,IV, VI: ptosis not present, extra-ocular motions intact bilaterally pupils equal, round, reactive to light and accommodation V,VII: smile assymmetric, right facial  droop, unable to accurately assess facial light touch sensation VIII: Hearing intact to voice IX,X: uvula rises symmetrically XI: bilateral shoulder shrug XII: midline tongue extension Motor:  Able to wiggle toes, right arm no effort against gravity,  Right : Upper extremity   1/5    Left:     Upper extremity   5/5  Lower extremity   45     Lower extremity   5/5 Tone and bulk:normal tone throughout; no atrophy noted Sensory: unable to accurately assess due to aphasia, but pt does nod yes to equal sensation bilaterally Deep Tendon Reflexes: 2+ brisk and symmetric throughout Plantars: Right: downgoing- weaker   Left: downgoing Cerebellar: Unable to perform finger-to-nose on right due to  right sided weakness - with no effort against gravity, bradykinetic heel-to-shin test on right, but no ataxia  noted Gait: not assessed  NIHSS 1a Level of Conscious.: 0 1b LOC Questions: 2 1c LOC Commands: 0 2 Best Gaze: 0 3 Visual: 0 4 Facial Palsy: 2 5a Motor Arm - left: 0 5b Motor Arm - Right: 3 6a Motor Leg - Left: 0 6b Motor Leg - Right: 0 7 Limb Ataxia: 0 8 Sensory: 0 9 Best Language:2 10 Dysarthria: 2 11 Extinct. and Inatten.: 0 TOTAL: 11  Results for orders placed or performed during the hospital encounter of 08/06/17 (from the past 48 hour(s))  I-stat troponin, ED     Status: None   Collection Time: 08/06/17  5:34 PM  Result Value Ref Range   Troponin i, poc 0.00 0.00 - 0.08 ng/mL   Comment 3            Comment: Due to the release kinetics of cTnI, a negative result within the first hours of the onset of symptoms does not rule out myocardial infarction with certainty. If myocardial infarction is still suspected, repeat the test at appropriate intervals.   I-Stat Chem 8, ED     Status: Abnormal   Collection Time: 08/06/17  5:36 PM  Result Value Ref Range   Sodium 135 135 - 145 mmol/L   Potassium 5.6 (H) 3.5 - 5.1 mmol/L   Chloride 101 101 - 111 mmol/L   BUN 32 (H) 6 - 20  mg/dL   Creatinine, Ser 1.50 (H) 0.61 - 1.24 mg/dL   Glucose, Bld 144 (H) 65 - 99 mg/dL   Calcium, Ion 1.07 (L) 1.15 - 1.40 mmol/L   TCO2 25 22 - 32 mmol/L   Hemoglobin 16.3 13.0 - 17.0 g/dL   HCT 48.0 39.0 - 52.0 %   No results found. IMAGING CT Head without Contrast 08/06/17 Impression IMPRESSION: 1. Disproportionately dense LEFT M2 versus M3 MCA, possible thromboembolism in a background of probable hemoconcentration. 2. ASPECTS is 10.  CTA NECK: 08/06/17 Impression 1. LEFT internal carotid artery 1 cm intimal hematoma approximately 2 cm from the origin with mobile low-density plaque/hematoma in central lumen, at risk for embolization. Approximately 55% stenosis LEFT ICA. 2. Severe bilateral C4-5 neural foraminal narrowing.  CTA HEAD: 08/06/17 Impression  1. No emergent large vessel occlusion. However, paucity of distal LEFT MCA vessels associated with acute small LEFT frontoparietal/MCA  territory nonhemorrhagic infarct.   Assessment: 83 y.o. male with medical history significant for colon cancer status post resection, hypertension, CKD III and chronic diastolic CHF who presents to the ED with acute onset right sided weakness and aphasia.  1. Left frontoparietal/MCA stroke with left internal carotid intimal hematoma.  Patient with right hemiparesis with no effort in right arm against gravity, dense expressive aphasia, patient with good comprehension and able to follow commands.    Initial CT head showed dense L left M2 versus N3 MCA possible thromboembolism, CTA head done a few minutes later showed left frontoparietal/MCA infarct, CTA neck showed 1 cm intimal hematoma at risk for embolization. Patient's family was called, son unable to come to the hospital at this time, implied consent was obtained and patient was started on TPA at 55.  Patient's symptoms unchanged post TPA, patient will be admitted to ICU for closer critical neuro monitoring.  We will continue to monitor patient  on telemetry, repeat CT scan is than 24 hours post TPA administration.  Hold all blood thinners and aspirin 24 hours post TPA administration and resume statin at a later time.    2. HTN- Maintain hemodynamic stability and blood pressure less than 180/110 with IV labetalol and Cardene dripas needed  3. CKD III-continue aggressive IV fluid hydration post IV contrast, monitor renal function closely.  4. Chronic Diastolic CHF  Stroke Risk Factors -  hypertension  Plan: - HgbA1c, fasting lipid panel - MRI brain pending -repeat CT within 24 hours post TPA - PT consult, OT consult, Speech consult - Echocardiogram pending -Avoid all blood thinners, start antiplatelet aspirin 24 hrs post TPA, and post CT - High dose Statin : Atorvastatin 80mg  after 24 hours - Risk factor modification - Telemetry monitoring -  Frequent neuro checks - VTE: SCD - GI: Protonix - Bowel Prophylaxis Senokot  Jacob Moores DNP Neuro-hospitalist Team [phone] 08/06/2017, 5:46 PM

## 2017-08-07 ENCOUNTER — Inpatient Hospital Stay (HOSPITAL_COMMUNITY): Payer: PPO

## 2017-08-07 DIAGNOSIS — I371 Nonrheumatic pulmonary valve insufficiency: Secondary | ICD-10-CM

## 2017-08-07 DIAGNOSIS — I63412 Cerebral infarction due to embolism of left middle cerebral artery: Principal | ICD-10-CM

## 2017-08-07 DIAGNOSIS — I5030 Unspecified diastolic (congestive) heart failure: Secondary | ICD-10-CM

## 2017-08-07 DIAGNOSIS — R1312 Dysphagia, oropharyngeal phase: Secondary | ICD-10-CM

## 2017-08-07 DIAGNOSIS — E785 Hyperlipidemia, unspecified: Secondary | ICD-10-CM

## 2017-08-07 DIAGNOSIS — I6522 Occlusion and stenosis of left carotid artery: Secondary | ICD-10-CM

## 2017-08-07 LAB — TROPONIN I
Troponin I: <0.03 ng/mL (ref <0.03)
Troponin I: <0.03 ng/mL (ref <0.03)

## 2017-08-07 LAB — LIPID PANEL
CHOLESTEROL: 208 mg/dL — AB (ref 0–200)
HDL: 54 mg/dL (ref >40)
LDL Cholesterol: 136 mg/dL — ABNORMAL HIGH (ref 0–99)
Total CHOL/HDL Ratio: 3.9 RATIO
Triglycerides: 91 mg/dL (ref <150)
VLDL: 18 mg/dL (ref 0–40)

## 2017-08-07 LAB — ECHOCARDIOGRAM COMPLETE: WEIGHTICAEL: 2067.03 [oz_av]

## 2017-08-07 MED ORDER — HEPARIN (PORCINE) IN NACL 100-0.45 UNIT/ML-% IJ SOLN
700.0000 [IU]/h | INTRAMUSCULAR | Status: AC
  Administered 2017-08-07: 600 [IU]/h via INTRAVENOUS
  Administered 2017-08-09: 750 [IU]/h via INTRAVENOUS
  Filled 2017-08-07: qty 500
  Filled 2017-08-07: qty 250

## 2017-08-07 NOTE — Progress Notes (Signed)
Modified Barium Swallow Progress Note  Patient Details  Name: William Patel MRN: [id number] Date of Birth: 1934-04-28  Today's Date: 08/07/2017  Modified Barium Swallow completed.  Full report located under Chart Review in the Imaging Section.  Brief recommendations include the following:  Clinical Impression  Pt presents with moderate-severe oral and moderate pharyngeal dysphagia with aspiration of thin and nectar thick liquids. Pt senses moderate amounts of aspiration, however cough response to trace amounts is inconsistent and delayed. Oral stage characterized by right anterior bolus loss, weak and discoordinated lingual manipulation, with premature spillage of liquids and solids, decreased bolus cohesion, piecemeal deglutition, right buccal pocketing and lingual residue. Posterior loss of solid places pt at risk for airway obstruction. Pharyngeal stage is characterized by mildly decreased base of tongue retraction resulting in mild coating of residue in the valleculae, along superior aspect of the epiglottis and in the pyriform sinuses. There is penetration before and aspiration during the swallow due to premature spillage of thin and nectar thick liquids. Cough response with teaspoon of thin is immediate and protective, however with smaller volumes of aspiration pt's sensation is delayed or absent. Recommend dys 1, honey thick liquids by small sips or teaspoon. Sweep tongue to clear right buccal cavity. Crush meds in puree. Will follow for interventions to improve swallow function, consider repeat MBS in several days.    Swallow Evaluation Recommendations       SLP Diet Recommendations: Honey thick liquids;Dysphagia 1 (Puree) solids   Liquid Administration via: Spoon;Cup   Medication Administration: Crushed with puree   Supervision: Full supervision/cueing for compensatory strategies   Compensations: Slow rate;Small sips/bites;Lingual sweep for clearance of pocketing;Monitor for  anterior loss;Multiple dry swallows after each bite/sip;Clear throat intermittently       Oral Care Recommendations: Oral care BID   Other Recommendations: Have oral suction available  [name], [place], [place] Speech-Language Pathologist [phone]  Aliene Altes 08/07/2017,1:28 PM

## 2017-08-07 NOTE — Progress Notes (Signed)
  Echocardiogram 2D Echocardiogram has been performed.  Jowell Bossi G Lauryl Seyer 08/07/2017, 9:32 AM

## 2017-08-07 NOTE — Evaluation (Signed)
Speech Language Pathology Evaluation Patient Details Name: William Patel MRN: [id number] DOB: April 11, 1934 Today's Date: 08/07/2017 Time: 1751-0258 SLP Time Calculation (min) (ACUTE ONLY): 16 min  Problem List:  Patient Active Problem List   Diagnosis Date Noted  . Stroke (cerebrum) ([place]) 08/06/2017  . AKI (acute kidney injury) ([place]) 03/27/2016  . CKD (chronic kidney disease), stage II 03/27/2016  . Hypertension 03/27/2016  . Chronic diastolic CHF (congestive heart failure) ([place]) 03/27/2016  . Chest pain, midsternal 06/14/2012  . Abdominal pain- LLQ 06/14/2012  . Near syncope 06/14/2012  . Vertigo 06/14/2012  . Diverticulitis of sigmoid colon 06/14/2012  . PVC (premature ventricular contraction) 08/13/2011  . Pre-operative cardiovascular examination 08/13/2011  . Colon cancer-proximal descending left colon 08/06/2011  . Tobacco use-chews 08/06/2011   Past Medical History:  Past Medical History:  Diagnosis Date  . Colon cancer ([place])    Left   . Generalized headaches   . Hematuria   . Hypertension 03/27/2016  . Leg swelling occasional   both legs  . Rectal bleeding    small amount, none current  . Vertigo   . Visual disturbances    Past Surgical History:  Past Surgical History:  Procedure Laterality Date  . COLON RESECTION  09/21/2011   Procedure: COLON RESECTION LAPAROSCOPIC;  Surgeon: Pedro Earls, MD;  Location: [facility];  Service: General;  Laterality: N/A;     . COLONOSCOPY  feb 2013   endo done also   HPI:  William Patel an 84 y.o.malewith medical history significant forcolon cancer status post resection, hypertension, CKD IIIand chronic diastolic CHF who presented to theED3/8/19 with acute onset right sided weakness and aphasia. Immediate CT headshowed disproportionately dense LEFT M2 versus M3 MCA, possible thromboembolism in a background of probable hemoconcentration.Patient given immediate TPAAt 1745pm.CTA of the head showedpaucity of distal  LEFT MCA vessels associated with acute small LEFT frontoparietal/MCA territory nonhemorrhagic infarct. Stroke swallow screen was deferred as pt was not managing secretions.   Assessment / Plan / Recommendation Clinical Impression  Patient presents with severe dysarthria due to CN VII, X, XII weakness and severe expressive aphasia. Suspect mild auditory comprehension impairment as complex commands and complex Y/N questions 75% accurate. Aphasia most consistent with conduction type aphasia; repetition is impaired at word level (single syllable words intact, aside from dysarthria). With automatic speech tasks, initiation is not impaired but responses are neologisms, stereotypic utterances. Attempts to communicate in phrases, sentences characterized by jargon, neologisms as well. Prior to admit, pt living independently and was still driving. Pt would benefit from CIR level therapies given severity of deficits and high level of function prior to CVA. Will follow acutely.     SLP Assessment  SLP Recommendation/Assessment: Patient needs continued Speech Lanaguage Pathology Services SLP Visit Diagnosis: Aphasia (R47.01);Dysarthria and anarthria (R47.1)    Follow Up Recommendations  Inpatient Rehab    Frequency and Duration min 3x week  2 weeks      SLP Evaluation Cognition  Overall Cognitive Status: Difficult to assess(due to aphasia) Arousal/Alertness: Awake/alert Orientation Level: Oriented to person;Oriented to place;Oriented to time;Oriented to situation(with Y/N questions) Attention: Focused;Sustained Focused Attention: Appears intact Sustained Attention: Appears intact       Comprehension  Auditory Comprehension Overall Auditory Comprehension: Impaired Yes/No Questions: Impaired Basic Biographical Questions: 76-100% accurate(100%) Basic Immediate Environment Questions: 75-100% accurate(100%) Complex Questions: 75-100% accurate(75%) Commands: Impaired One Step Basic Commands: 75-100%  accurate(100%) Two Step Basic Commands: 75-100% accurate(100%) Complex Commands: 75-100% accurate(75%) Conversation: Simple Visual Recognition/Discrimination Discrimination: Within  Function Limits Reading Comprehension Reading Status: Not tested    Expression Expression Primary Mode of Expression: Verbal Verbal Expression Overall Verbal Expression: Impaired Initiation: No impairment Automatic Speech: (unable) Level of Generative/Spontaneous Verbalization: Sentence(jargon) Repetition: Impaired Level of Impairment: Word level;Phrase level(2/10) Naming: Impairment Confrontation: Impaired Verbal Errors: Jargon;Aware of errors;Not aware of errors;Neologisms;Phonemic paraphasias(stereotypic utterance) Pragmatics: No impairment Non-Verbal Means of Communication: Gestures;Eye gaze Written Expression Dominant Hand: Right Written Expression: Not tested   Oral / Motor  Oral Motor/Sensory Function Overall Oral Motor/Sensory Function: Moderate impairment Facial ROM: Reduced right;Suspected CN VII (facial) dysfunction Facial Symmetry: Abnormal symmetry right;Suspected CN VII (facial) dysfunction Facial Strength: Reduced right;Suspected CN VII (facial) dysfunction Lingual Symmetry: Abnormal symmetry right;Suspected CN XII (hypoglossal) dysfunction Lingual Strength: Reduced;Suspected CN XII (hypoglossal) dysfunction Velum: Impaired right;Suspected CN X (Vagus) dysfunction Mandible: Within Functional Limits Motor Speech Overall Motor Speech: Impaired Respiration: Within functional limits Phonation: Low vocal intensity;Wet(low pitch) Resonance: Hypernasality Articulation: Impaired Level of Impairment: Word Intelligibility: Intelligibility reduced Word: 0-24% accurate Phrase: 0-24% accurate Sentence: 0-24% accurate Conversation: Not tested Motor Planning: Witnin functional limits Motor Speech Errors: Aware   GO                   [name], [place], [place]  Pathologist [place] 08/07/2017, 11:20 AM

## 2017-08-07 NOTE — Progress Notes (Signed)
Patient finished tPA at [place] and had no signs of stroke throughout the night. Patient's ability to move the RUE is improving. Dysarthria still continues but patient nods head appropriately to questions.

## 2017-08-07 NOTE — Progress Notes (Signed)
PT Cancellation Note  Patient Details Name: William Patel MRN: [id number] DOB: 06/19/1933   Cancelled Treatment:    Reason Eval/Treat Not Completed: Patient not medically ready. Pt remains on bedrest. Acute PT to return as able once mobility orders are updated.   Kittie Plater, PT, DPT Pager #: [phone] Office #: [phone]  [place] 08/07/2017, 10:29 AM

## 2017-08-07 NOTE — Plan of Care (Signed)
Pt has dysphagia 1 diet, eats 60-75% of meals w/ assistance from staff.

## 2017-08-07 NOTE — Progress Notes (Signed)
STROKE TEAM PROGRESS NOTE   SUBJECTIVE (INTERVAL HISTORY) His granddaughter is at the bedside.  Patient is awake, alert, still has expressive aphasia, and right arm paresis.  MRI pending in p.m. BP stable.  Just finished 2D echo.   OBJECTIVE Temp:  [98.3 F (36.8 C)-99.2 F (37.3 C)] 98.6 F (37 C) (03/09 1200) Pulse Rate:  [63-112] 84 (03/09 1600) Cardiac Rhythm: Normal sinus rhythm (03/09 1200) Resp:  [12-27] 24 (03/09 1600) BP: (102-143)/(63-92) 127/85 (03/09 1600) SpO2:  [94 %-100 %] 96 % (03/09 1600) Weight:  [129 lb 3 oz (58.6 kg)] 129 lb 3 oz (58.6 kg) (03/08 1758)  CBC:  Recent Labs  Lab 08/06/17 1736 08/06/17 1742  WBC  --  7.8  NEUTROABS  --  5.8  HGB 16.3 16.0  HCT 48.0 46.2  MCV  --  93.9  PLT  --  147*    Basic Metabolic Panel:  Recent Labs  Lab 08/06/17 1736 08/06/17 1742  NA 135 135  K 5.6* 4.1  CL 101 103  CO2  --  22  GLUCOSE 144* 171*  BUN 32* 20  CREATININE 1.50* 1.67*  CALCIUM  --  9.0    Lipid Panel:     Component Value Date/Time   CHOL 208 (H) 08/07/2017 0259   TRIG 91 08/07/2017 0259   HDL 54 08/07/2017 0259   CHOLHDL 3.9 08/07/2017 0259   VLDL 18 08/07/2017 0259   LDLCALC 136 (H) 08/07/2017 0259   HgbA1c: No results found for: HGBA1C Urine Drug Screen: No results found for: LABOPIA, COCAINSCRNUR, LABBENZ, AMPHETMU, THCU, LABBARB  Alcohol Level     Component Value Date/Time   [facility]  06/01/2010 1508    <5        LOWEST DETECTABLE LIMIT FOR SERUM ALCOHOL IS 5 mg/dL FOR MEDICAL PURPOSES ONLY    IMAGING I have personally reviewed the radiological images below and agree with the radiology interpretations.  Ct Angio Head W Or Wo Contrast Ct Angio Neck W Or Wo Contrast 08/06/2017  CTA NECK:  1. LEFT internal carotid artery 1 cm intimal hematoma approximately 2 cm from the origin with mobile low-density plaque/hematoma in central lumen, at risk for embolization. Approximately 55% stenosis LEFT ICA.  2. Severe bilateral C4-5 neural  foraminal narrowing.  CTA HEAD:  1. No emergent large vessel occlusion. However, paucity of distal LEFT MCA vessels associated with acute small LEFT frontoparietal/MCA territory nonhemorrhagic infarct.   Ct Head Code Stroke Wo Contrast 08/06/2017 IMPRESSION:  1. Disproportionately dense LEFT M2 versus M3 MCA, possible thromboembolism in a background of probable hemoconcentration.  2. ASPECTS is 10.   MRI pending  TTE - Left ventricle: The cavity size was normal. Wall thickness was   normal. Systolic function was vigorous. The estimated ejection   fraction was in the range of 65% to 70%. Wall motion was normal;   there were no regional wall motion abnormalities. Doppler   parameters are consistent with abnormal left ventricular   relaxation (grade 1 diastolic dysfunction). - Aortic valve: There was trivial regurgitation.   PHYSICAL EXAM Vitals:   08/07/17 1300 08/07/17 1400 08/07/17 1500 08/07/17 1600  BP: 113/72 113/64 102/63 127/85  Pulse: 75 85 65 84  Resp: 18 (!) 27 20 (!) 24  Temp:      TempSrc:      SpO2: 96% 95% 97% 96%  Weight:        Temp:  [98.3 F (36.8 C)-99.2 F (37.3 C)] 98.6 F (37 C) (03/09  1200) Pulse Rate:  [63-112] 84 (03/09 1600) Resp:  [12-27] 24 (03/09 1600) BP: (102-143)/(63-92) 127/85 (03/09 1600) SpO2:  [94 %-100 %] 96 % (03/09 1600) Weight:  [129 lb 3 oz (58.6 kg)] 129 lb 3 oz (58.6 kg) (03/08 1758)  General - Well nourished, well developed, in no apparent distress.  Ophthalmologic - Fundi not visualized due to noncooperation.  Cardiovascular - Regular rate and rhythm.  Neuro -awake, alert, follows simple commands, however expressive aphasia with mumbling sound.  Not able to name or repeat.  PERRL, EOMI, no gaze preference.  Blinking to visual threat bilaterally.  Right facial droop, tongue midline.  Right upper extremity 3-/5, right lower extremity 5-/5, left upper extremity and lower extremity 5/5.  DTR 1+, no Babinski.  Sensation  symmetrical, coordination not cooperative, gait not tested.   ASSESSMENT/PLAN Mr. DIERRE CREVIER is a 84 y.o. male with history of hypertension and colon cancer, presenting with left gaze preference, right sided weakness, and severe aphasia.  The patient received IV TPA on Friday 08/06/2017 at 1745.  Stroke - left MCA infarct embolic likely due to left ICA soft plaque with intimal hematoma    Resultant  Expressive aphasia, right facial droop and right UE weakness  CT head - Disproportionately dense LEFT M2 versus M3 MCA  MRI head - pending  CTA H&N - LEFT ICA intimal hematoma approximately 2 cm from the origin with mobile low-density plaque/hematoma in central lumen, at risk for embolization.  2D Echo - EF 65-70%  LDL - 136  HgbA1c - pending  VTE prophylaxis - SCDs Fall precautions Check puncture sites for bleeding or hematomas. Bleeding precautions Fall precautions DIET - DYS 1 Room service appropriate? Yes; Fluid consistency: Honey Thick  No antithrombotic prior to admission, now on No antithrombotic within 24h tPA.  Patient counseled to be compliant with his antithrombotic medications  Ongoing aggressive stroke risk factor management  Therapy recommendations:  pending  Disposition:  Pending  Left ICA intimal hematoma with mobile density plaque/hematoma in central lumen  Risk for embolization  S/p tPA  Will need heparin drip initially after tPA   May consider DAPT on discharge.   Hypertension  Blood pressure tends to run low  Permissive hypertension (OK if < 180/105) but gradually normalize in 5-7 days  Long-term BP goal normotensive  Hyperlipidemia  Home meds: No lipid lowering medications prior to admission  LDL 136, goal < 70  Currently NPO. Add statin when appropriate.  Continue statin at discharge  Tobacco abuse  Current smoker  Smoking cessation counseling provided  Pt is willing to quit  Other Stroke Risk Factors  Advanced  age  CHF - diastolic   ETOH use, advised to drink no more than 1 drink per day.  Other Active Problems  CKD III - Cre 1.67  Colon Ca s/p surgery  Hospital day # 1  This patient is critically ill due to left MCA infarct s/p tPA, left ICA mobile plaque, dysphagia and at significant risk of neurological worsening, death form recurrent stroke, hemorrhagic conversion, respiration pneumonia, seizure. This patient's care requires constant monitoring of vital signs, hemodynamics, respiratory and cardiac monitoring, review of multiple databases, neurological assessment, discussion with family, other specialists and medical decision making of high complexity. I spent 40 minutes of neurocritical care time in the care of this patient.  Rosalin Hawking, MD PhD Stroke Neurology 08/07/2017 5:00 PM   To contact Stroke Continuity provider, please refer to [email or web address] After hours, contact General Neurology

## 2017-08-07 NOTE — H&P (Signed)
Referring Physician: Greta Doom, *    Chief Complaint: Right sided weakness with dysphagia  HPI: William Patel is an 84 y.o. male  with medical history significant for colon cancer status post resection, hypertension, CKD III and chronic diastolic CHF who presents to the ED with acute onset right sided weakness and aphasia.  Patient was in his usual state of health, at baseline ambulatory without assistance.  He was at the [facility] with his family got into the car to drive home and in the parking lot started having acute right-sided weakness, inability to move his right side with this aphasia.  EMS was immediately called, and on arrival patient states right-sided symptoms continue to persist with a right facial droop blood pressure 150/90.  On admission to the ER, patient was aphasic attempting to mumble, with right facial droop and some drooling.  Right arm no effort against gravity but able to move bilateral lower extremities equally with mild right lower extremity weakness.  Patient appears to have visual fields intact and initial NIH score was 11  Immediate CT head showed disproportionately dense LEFT M2 versus M3 MCA, possible thromboembolism in a background of probable hemoconcentration.  Family was called by Dr. Leonel Ramsay who was able to speak to patient's son, received implied consent and patient given immediate TPA  At 1745pm.  CTA of the head showed paucity of distal LEFT MCA vessels associated with acute small LEFT frontoparietal/MCA territory nonhemorrhagic infarct.  CTA Neck: LEFT internal carotid artery 1 cm intimal hematoma at risk for embolization as well as severe bilateral C4-5 neural foraminal narrowing.  Patient will be admitted to ICU post TPA for more critical neuro monitoring **Unable to accurately obtain HPI and review of systems secondary to aphasia-patient remained high patient being on blood thinners or having recent surgery   LSN: 08/06/17 - 1645 tPA Given:  Yes at 1745  Premorbid modified Rankin scale (mRS): 0   Past Medical History:  Diagnosis Date  . Colon cancer ([place])    Left   . Generalized headaches   . Hematuria   . Hypertension 03/27/2016  . Leg swelling occasional   both legs  . Rectal bleeding    small amount, none current  . Vertigo   . Visual disturbances     Past Surgical History:  Procedure Laterality Date  . COLON RESECTION  09/21/2011   Procedure: COLON RESECTION LAPAROSCOPIC;  Surgeon: Pedro Earls, MD;  Location: [facility];  Service: General;  Laterality: N/A;     . COLONOSCOPY  feb 2013   endo done also    Family History  Problem Relation Age of Onset  . Heart failure Mother   . Brain cancer Mother   . Brain cancer Father    Social History:  reports that he has been smoking cigars.  His smokeless tobacco use includes chew. He reports that he drinks about 1.8 oz of alcohol per week. He reports that he does not use drugs.  Allergies:  Allergies  Allergen Reactions  . Tramadol Other (See Comments)    Causes headaches    Medications:  .  stroke: mapping our early stages of recovery book   Does not apply Once  . lidocaine  1 patch Transdermal Q24H  . pantoprazole (PROTONIX) IV  40 mg Intravenous QHS  . sertraline  25 mg Oral Daily    ROS: Unable to accurately obtain  ROS secondary to aphasia  Physical Examination: Blood pressure 109/72, pulse 63, temperature 98.8 F (  37.1 C), resp. rate 19, weight 58.6 kg (129 lb 3 oz), SpO2 98 %. HEENT-  Normocephalic, no lesions, without obvious abnormality.  Normal external eye and conjunctiva.   Cardiovascular- S1-S2 audible, pulses palpable throughout   Lungs-no rhonchi or wheezing noted, no excessive working breathing.  Saturations within normal limits Abdomen- All 4 quadrants palpated and nontender Musculoskeletal-no joint tenderness, deformity or swelling Skin-warm and dry,   Neurological Examination Mental Status: Alert, oriented, moderate to severe  aphasia. Able to nod answers and  able to follow 3 step commands without difficulty. Cranial Nerves: XI:PJASNK fields intact III,IV, VI: ptosis not present, extra-ocular motions intact bilaterally pupils equal, round, reactive to light and accommodation V,VII: smile assymmetric, right facial droop, unable to accurately assess facial light touch sensation VIII: Hearing intact to voice IX,X: uvula rises symmetrically XI: bilateral shoulder shrug XII: midline tongue extension Motor:  Able to wiggle toes, right arm no effort against gravity,  Right : Upper extremity   1/5    Left:     Upper extremity   5/5  Lower extremity   45     Lower extremity   5/5 Tone and bulk:normal tone throughout; no atrophy noted Sensory: unable to accurately assess due to aphasia, but pt does nod yes to equal sensation bilaterally Deep Tendon Reflexes: 2+ brisk and symmetric throughout Plantars: Right: downgoing- weaker   Left: downgoing Cerebellar: Unable to perform finger-to-nose on right due to  right sided weakness - with no effort against gravity, bradykinetic heel-to-shin test on right, but no ataxia  noted Gait: not assessed  NIHSS 1a Level of Conscious.: 0 1b LOC Questions: 2 1c LOC Commands: 0 2 Best Gaze: 0 3 Visual: 0 4 Facial Palsy: 2 5a Motor Arm - left: 0 5b Motor Arm - Right: 3 6a Motor Leg - Left: 0 6b Motor Leg - Right: 0 7 Limb Ataxia: 0 8 Sensory: 0 9 Best Language:2 10 Dysarthria: 2 11 Extinct. and Inatten.: 0 TOTAL: 11  Results for orders placed or performed during the hospital encounter of 08/06/17 (from the past 48 hour(s))  I-stat troponin, ED     Status: None   Collection Time: 08/06/17  5:34 PM  Result Value Ref Range   Troponin i, poc 0.00 0.00 - 0.08 ng/mL   Comment 3            Comment: Due to the release kinetics of cTnI, a negative result within the first hours of the onset of symptoms does not rule out myocardial infarction with certainty. If myocardial  infarction is still suspected, repeat the test at appropriate intervals.   I-Stat Chem 8, ED     Status: Abnormal   Collection Time: 08/06/17  5:36 PM  Result Value Ref Range   Sodium 135 135 - 145 mmol/L   Potassium 5.6 (H) 3.5 - 5.1 mmol/L   Chloride 101 101 - 111 mmol/L   BUN 32 (H) 6 - 20 mg/dL   Creatinine, Ser 1.50 (H) 0.61 - 1.24 mg/dL   Glucose, Bld 144 (H) 65 - 99 mg/dL   Calcium, Ion 1.07 (L) 1.15 - 1.40 mmol/L   TCO2 25 22 - 32 mmol/L   Hemoglobin 16.3 13.0 - 17.0 g/dL   HCT 48.0 39.0 - 52.0 %  Protime-INR     Status: None   Collection Time: 08/06/17  5:42 PM  Result Value Ref Range   Prothrombin Time 13.2 11.4 - 15.2 seconds   INR 1.01     Comment: Performed  at [facility], [place] [address], [place], [place] [address]  APTT     Status: None   Collection Time: 08/06/17  5:42 PM  Result Value Ref Range   aPTT 29 24 - 36 seconds    Comment: Performed at [facility] [address] [place], [place] [address]  CBC     Status: Abnormal   Collection Time: 08/06/17  5:42 PM  Result Value Ref Range   WBC 7.8 4.0 - 10.5 K/uL   RBC 4.92 4.22 - 5.81 MIL/uL   Hemoglobin 16.0 13.0 - 17.0 g/dL   HCT 46.2 39.0 - 52.0 %   MCV 93.9 78.0 - 100.0 fL   MCH 32.5 26.0 - 34.0 pg   MCHC 34.6 30.0 - 36.0 g/dL   RDW 12.7 11.5 - 15.5 %   Platelets 147 (L) 150 - 400 K/uL    Comment: Performed at [facility] [address], [place], [place] [address]  Differential     Status: None   Collection Time: 08/06/17  5:42 PM  Result Value Ref Range   Neutrophils Relative % 75 %   Neutro Abs 5.8 1.7 - 7.7 K/uL   Lymphocytes Relative 17 %   Lymphs Abs 1.3 0.7 - 4.0 K/uL   Monocytes Relative 7 %   Monocytes Absolute 0.5 0.1 - 1.0 K/uL   Eosinophils Relative 1 %   Eosinophils Absolute 0.1 0.0 - 0.7 K/uL   Basophils Relative 0 %   Basophils Absolute 0.0 0.0 - 0.1 K/uL    Comment: Performed at [facility] [address] [place], [place] [address]  Comprehensive  metabolic panel     Status: Abnormal   Collection Time: 08/06/17  5:42 PM  Result Value Ref Range   Sodium 135 135 - 145 mmol/L   Potassium 4.1 3.5 - 5.1 mmol/L    Comment: DELTA CHECK NOTED   Chloride 103 101 - 111 mmol/L   CO2 22 22 - 32 mmol/L   Glucose, Bld 171 (H) 65 - 99 mg/dL   BUN 20 6 - 20 mg/dL   Creatinine, Ser 1.67 (H) 0.61 - 1.24 mg/dL   Calcium 9.0 8.9 - 10.3 mg/dL   Total Protein 6.2 (L) 6.5 - 8.1 g/dL   Albumin 3.8 3.5 - 5.0 g/dL   AST 24 15 - 41 U/L   ALT 13 (L) 17 - 63 U/L   Alkaline Phosphatase 58 38 - 126 U/L   Total Bilirubin 1.0 0.3 - 1.2 mg/dL   GFR calc non Af Amer 37 (L) >60 mL/min   GFR calc Af Amer 43 (L) >60 mL/min    Comment: (NOTE) The eGFR has been calculated using the CKD EPI equation. This calculation has not been validated in all clinical situations. eGFR's persistently <60 mL/min signify possible Chronic Kidney Disease.    Anion gap 10 5 - 15    Comment: Performed at [facility] [address] [place], [place] [address]  MRSA PCR Screening     Status: None   Collection Time: 08/06/17  7:03 PM  Result Value Ref Range   MRSA by PCR NEGATIVE NEGATIVE    Comment:        The GeneXpert MRSA Assay (FDA approved for NASAL specimens only), is one component of a comprehensive MRSA colonization surveillance program. It is not intended to diagnose MRSA infection nor to guide or monitor treatment for MRSA infections. Performed at [facility],  [address], [place],  [address]   Lipid panel     Status: Abnormal   Collection Time: 08/07/17  2:59 AM  Result Value Ref Range   Cholesterol 208 (H) 0 - 200 mg/dL   Triglycerides 91 <150 mg/dL   HDL 54 >40 mg/dL   Total CHOL/HDL Ratio 3.9 RATIO   VLDL 18 0 - 40 mg/dL   LDL Cholesterol 136 (H) 0 - 99 mg/dL    Comment:        Total Cholesterol/HDL:CHD Risk Coronary Heart Disease Risk Table                     Men   Women  1/2 Average Risk   3.4   3.3  Average Risk       5.0    4.4  2 X Average Risk   9.6   7.1  3 X Average Risk  23.4   11.0        Use the calculated Patient Ratio above and the CHD Risk Table to determine the patient's CHD Risk.        ATP III CLASSIFICATION (LDL):  <100     mg/dL   Optimal  100-129  mg/dL   Near or Above                    Optimal  130-159  mg/dL   Borderline  160-189  mg/dL   High  >190     mg/dL   Very High Performed at [facility] [address] [place], [place] [address]    Ct Angio Head W Or Wo Contrast  Result Date: 08/06/2017 CLINICAL DATA:  RIGHT facial droop, aphasia. Last seen normal at 1645 hours. Status post tPA. EXAM: CT ANGIOGRAPHY HEAD AND NECK TECHNIQUE: Multidetector CT imaging of the head and neck was performed using the standard protocol during bolus administration of intravenous contrast. Multiplanar CT image reconstructions and MIPs were obtained to evaluate the vascular anatomy. Carotid stenosis measurements (when applicable) are obtained utilizing NASCET criteria, using the distal internal carotid diameter as the denominator. CONTRAST:  39m ISOVUE-370 IOPAMIDOL (ISOVUE-370) INJECTION 76% COMPARISON:  CT HEAD August 06, 2017 at 1737 hours FINDINGS: CTA NECK FINDINGS: AORTIC ARCH: Normal appearance of the thoracic arch, normal branch pattern. Trace calcific atherosclerosis aortic arch. The origins of the innominate, left Common carotid artery and subclavian artery are widely patent. RIGHT CAROTID SYSTEM: Common carotid artery is widely patent. Trace calcific atherosclerosis in intimal thickening carotid bifurcation without hemodynamically significant stenosis by NASCET criteria. Normal patent internal carotid artery with mild luminal irregularity most compatible with atherosclerosis. LEFT CAROTID SYSTEM: Common carotid artery is widely patent. Approximately 2 cm of the origin is a 1 cm segment of focal intimal thickening/hematoma with mobile soft tissue plaque extending into the central lumen, 4 mm  superior migration. VERTEBRAL ARTERIES:Left vertebral artery is dominant. Normal appearance of the vertebral arteries, widely patent. SKELETON: No acute osseous process though bone windows have not been submitted. Severe C4-5 and C5-6 spondylosis. Severe bilateral C4-5 neural foraminal narrowing. Patient is edentulous. OTHER NECK: Soft tissues of the neck are nonacute though, not tailored for evaluation. UPPER CHEST: Included lung apices are clear. No superior mediastinal lymphadenopathy. CTA HEAD FINDINGS: ANTERIOR CIRCULATION: Patent cervical internal carotid arteries, petrous, cavernous and supra clinoid internal carotid arteries. Patent anterior communicating artery. Patent anterior and middle cerebral arteries. Mild stenosis LEFT M1 origin. However, decreased distal LEFT frontoparietal MCA vessels with  new loss of gray-white matter junction about the central sulcus. No large vessel occlusion, significant stenosis, contrast extravasation or aneurysm. POSTERIOR CIRCULATION: Patent vertebral arteries, vertebrobasilar junction and basilar artery, as well as main branch vessels. Patent posterior cerebral arteries. Fetal origin LEFT posterior cerebral artery. No large vessel occlusion, significant stenosis, contrast extravasation or aneurysm. VENOUS SINUSES: Major dural venous sinuses are patent though not tailored for evaluation on this angiographic examination. ANATOMIC VARIANTS: Supernumerary anterior cerebral artery arising from LEFT A1-2 junction. DELAYED PHASE: Not performed. MIP images reviewed. IMPRESSION: CTA NECK: 1. LEFT internal carotid artery 1 cm intimal hematoma approximately 2 cm from the origin with mobile low-density plaque/hematoma in central lumen, at risk for embolization. Approximately 55% stenosis LEFT ICA. 2. Severe bilateral C4-5 neural foraminal narrowing. CTA HEAD: 1. No emergent large vessel occlusion. However, paucity of distal LEFT MCA vessels associated with acute small LEFT  frontoparietal/MCA territory nonhemorrhagic infarct. Critical Value/emergent results were called by telephone at the time of interpretation on 08/06/2017 at 6:15 pm to Dr. Leonel Ramsay, Neurology, who verbally acknowledged these results. Electronically Signed   By: Elon Alas M.D.   On: 08/06/2017 18:19   Ct Angio Neck W Or Wo Contrast  Result Date: 08/06/2017 CLINICAL DATA:  RIGHT facial droop, aphasia. Last seen normal at 1645 hours. Status post tPA. EXAM: CT ANGIOGRAPHY HEAD AND NECK TECHNIQUE: Multidetector CT imaging of the head and neck was performed using the standard protocol during bolus administration of intravenous contrast. Multiplanar CT image reconstructions and MIPs were obtained to evaluate the vascular anatomy. Carotid stenosis measurements (when applicable) are obtained utilizing NASCET criteria, using the distal internal carotid diameter as the denominator. CONTRAST:  82m ISOVUE-370 IOPAMIDOL (ISOVUE-370) INJECTION 76% COMPARISON:  CT HEAD August 06, 2017 at 1737 hours FINDINGS: CTA NECK FINDINGS: AORTIC ARCH: Normal appearance of the thoracic arch, normal branch pattern. Trace calcific atherosclerosis aortic arch. The origins of the innominate, left Common carotid artery and subclavian artery are widely patent. RIGHT CAROTID SYSTEM: Common carotid artery is widely patent. Trace calcific atherosclerosis in intimal thickening carotid bifurcation without hemodynamically significant stenosis by NASCET criteria. Normal patent internal carotid artery with mild luminal irregularity most compatible with atherosclerosis. LEFT CAROTID SYSTEM: Common carotid artery is widely patent. Approximately 2 cm of the origin is a 1 cm segment of focal intimal thickening/hematoma with mobile soft tissue plaque extending into the central lumen, 4 mm superior migration. VERTEBRAL ARTERIES:Left vertebral artery is dominant. Normal appearance of the vertebral arteries, widely patent. SKELETON: No acute osseous  process though bone windows have not been submitted. Severe C4-5 and C5-6 spondylosis. Severe bilateral C4-5 neural foraminal narrowing. Patient is edentulous. OTHER NECK: Soft tissues of the neck are nonacute though, not tailored for evaluation. UPPER CHEST: Included lung apices are clear. No superior mediastinal lymphadenopathy. CTA HEAD FINDINGS: ANTERIOR CIRCULATION: Patent cervical internal carotid arteries, petrous, cavernous and supra clinoid internal carotid arteries. Patent anterior communicating artery. Patent anterior and middle cerebral arteries. Mild stenosis LEFT M1 origin. However, decreased distal LEFT frontoparietal MCA vessels with new loss of gray-white matter junction about the central sulcus. No large vessel occlusion, significant stenosis, contrast extravasation or aneurysm. POSTERIOR CIRCULATION: Patent vertebral arteries, vertebrobasilar junction and basilar artery, as well as main branch vessels. Patent posterior cerebral arteries. Fetal origin LEFT posterior cerebral artery. No large vessel occlusion, significant stenosis, contrast extravasation or aneurysm. VENOUS SINUSES: Major dural venous sinuses are patent though not tailored for evaluation on this angiographic examination. ANATOMIC VARIANTS: Supernumerary anterior cerebral artery arising  from LEFT A1-2 junction. DELAYED PHASE: Not performed. MIP images reviewed. IMPRESSION: CTA NECK: 1. LEFT internal carotid artery 1 cm intimal hematoma approximately 2 cm from the origin with mobile low-density plaque/hematoma in central lumen, at risk for embolization. Approximately 55% stenosis LEFT ICA. 2. Severe bilateral C4-5 neural foraminal narrowing. CTA HEAD: 1. No emergent large vessel occlusion. However, paucity of distal LEFT MCA vessels associated with acute small LEFT frontoparietal/MCA territory nonhemorrhagic infarct. Critical Value/emergent results were called by telephone at the time of interpretation on 08/06/2017 at 6:15 pm to Dr.  Leonel Ramsay, Neurology, who verbally acknowledged these results. Electronically Signed   By: Elon Alas M.D.   On: 08/06/2017 18:19   Ct Head Code Stroke Wo Contrast  Result Date: 08/06/2017 CLINICAL DATA:  Code stroke. Altered level of consciousness. History of colon cancer, vertigo. EXAM: CT HEAD WITHOUT CONTRAST TECHNIQUE: Contiguous axial images were obtained from the base of the skull through the vertex without intravenous contrast. COMPARISON:  CT HEAD March 27, 2016 FINDINGS: Mild motion degraded examination. BRAIN: No intraparenchymal hemorrhage, mass effect nor midline shift. The ventricles and sulci are normal for age. Patchy supratentorial white matter hypodensities less than expected for patient's age, though non-specific are most compatible with chronic small vessel ischemic disease. RIGHT inferior basal ganglia unchanged perivascular space. No acute large vascular territory infarcts. No abnormal extra-axial fluid collections. Basal cisterns are patent. VASCULAR: Mild calcific atherosclerosis of the carotid siphons. Mildly dense intracranial vessels seen with hemoconcentration. However, slight lead disproportionate lead dense LEFT insular MCA. SKULL: No skull fracture. No significant scalp soft tissue swelling. SINUSES/ORBITS: The mastoid air-cells and included paranasal sinuses are well-aerated.The included ocular globes and orbital contents are non-suspicious. OTHER: None. ASPECTS [facility] Stroke Program Early CT Score) - Ganglionic level infarction (caudate, lentiform nuclei, internal capsule, insula, M1-M3 cortex): 7 - Supraganglionic infarction (M4-M6 cortex): 3 Total score (0-10 with 10 being normal): 10 IMPRESSION: 1. Disproportionately dense LEFT M2 versus M3 MCA, possible thromboembolism in a background of probable hemoconcentration. 2. ASPECTS is 10. 3. Critical Value/emergent results were called by telephone at the time of interpretation on 08/06/2017 at 5:53 pm to Dr. Leonel Ramsay,  Neurology, who verbally acknowledged these results. Electronically Signed   By: Elon Alas M.D.   On: 08/06/2017 17:54   IMAGING CT Head without Contrast 08/06/17 Impression IMPRESSION: 1. Disproportionately dense LEFT M2 versus M3 MCA, possible thromboembolism in a background of probable hemoconcentration. 2. ASPECTS is 10.  CTA NECK: 08/06/17 Impression 1. LEFT internal carotid artery 1 cm intimal hematoma approximately 2 cm from the origin with mobile low-density plaque/hematoma in central lumen, at risk for embolization. Approximately 55% stenosis LEFT ICA. 2. Severe bilateral C4-5 neural foraminal narrowing.  CTA HEAD: 08/06/17 Impression  1. No emergent large vessel occlusion. However, paucity of distal LEFT MCA vessels associated with acute small LEFT frontoparietal/MCA territory nonhemorrhagic infarct.   Assessment: 84 y.o. male with medical history significant for colon cancer status post resection, hypertension, CKD III and chronic diastolic CHF who presents to the ED with acute onset right sided weakness and aphasia.  1. Left frontoparietal/MCA stroke with left internal carotid intimal hematoma.  Patient with right hemiparesis with no effort in right arm against gravity, dense expressive aphasia, patient with good comprehension and able to follow commands.    Initial CT head showed dense L left M2 versus N3 MCA possible thromboembolism, CTA head done a few minutes later showed left frontoparietal/MCA infarct, CTA neck showed 1 cm intimal hematoma at risk for  embolization. Patient's family was called, son unable to come to the hospital at this time, implied consent was obtained and patient was started on TPA at 49.  Patient's symptoms unchanged post TPA, patient will be admitted to ICU for closer critical neuro monitoring.  We will continue to monitor patient on telemetry, repeat CT scan is than 24 hours post TPA administration.  Hold all blood thinners and aspirin 24 hours  post TPA administration and resume statin at a later time.    2. HTN- Maintain hemodynamic stability and blood pressure less than 180/110 with IV labetalol and Cardene dripas needed  3. CKD III-continue aggressive IV fluid hydration post IV contrast, monitor renal function closely.  4. Chronic Diastolic CHF  Stroke Risk Factors -  hypertension  Plan: - HgbA1c, fasting lipid panel - MRI brain pending -repeat CT within 24 hours post TPA - PT consult, OT consult, Speech consult - Echocardiogram pending -Avoid all blood thinners, start antiplatelet aspirin 24 hrs post TPA, and post CT - High dose Statin : Atorvastatin 71m after 24 hours - Risk factor modification - Telemetry monitoring -  Frequent neuro checks - VTE: SCD - GI: Protonix - Bowel Prophylaxis Senokot  PJacob MooresDNP Neuro-hospitalist Team [phone]/01/2018, 9:23 AM    I have seen the patient and reviewed the note of Pearl [place].  On exam, he has a left gaze preference, right arm and face weakness and numbness.  No hemianopia.  He has a severe aphasia, but is able to follow some commands.  He presented with signs/symptoms of right MCA branch occlusion likely due embolization from his intra-right vascular thrombus.  I discussed with his roommate who is able to exclude contra indications to TPA.  I tried calling his daughter who was not answering her phone.  Given that I was unable to contact family, and the time sensitive nature I felt that emergent treatment with presumed consent was appropriate, I discussed this with Dr. SBilly Fischerwho agreed.  He therefore received IV TPA.   Following this I was able to get in touch with his son and updated him on the situation.  Further plan as per PJacob Moores This patient is critically ill and at significant risk of neurological worsening, death and care requires constant monitoring of vital signs, hemodynamics,respiratory and cardiac monitoring,  neurological assessment, discussion with family, other specialists and medical decision making of high complexity. I personally spent 45 minutes of neurocritical care time  in the care of  this patient.    MRoland Rack MD [facility] [phone] If 7pm- 7am, please page neurology on call as listed in AM[facility] 08/06/2017  7:56 PM

## 2017-08-07 NOTE — Evaluation (Signed)
Clinical/Bedside Swallow Evaluation Patient Details  Name: William Patel MRN: [id number] Date of Birth: 10/18/1933  Today's Date: 08/07/2017 Time: SLP Start Time (ACUTE ONLY): 1017 SLP Stop Time (ACUTE ONLY): 1028 SLP Time Calculation (min) (ACUTE ONLY): 11 min  Past Medical History:  Past Medical History:  Diagnosis Date  . Colon cancer ([place])    Left   . Generalized headaches   . Hematuria   . Hypertension 03/27/2016  . Leg swelling occasional   both legs  . Rectal bleeding    small amount, none current  . Vertigo   . Visual disturbances    Past Surgical History:  Past Surgical History:  Procedure Laterality Date  . COLON RESECTION  09/21/2011   Procedure: COLON RESECTION LAPAROSCOPIC;  Surgeon: Pedro Earls, MD;  Location: [facility];  Service: General;  Laterality: N/A;     . COLONOSCOPY  feb 2013   endo done also   HPI:  Draden Cottingham Patel an 84 y.o.malewith medical history significant forcolon cancer status post resection, hypertension, CKD IIIand chronic diastolic CHF who [facility] with acute onset right sided weakness and aphasia. Immediate CT headshowed disproportionately dense LEFT M2 versus M3 MCA, possible thromboembolism in a background of probable hemoconcentration.Patient given immediate TPAAt 1745pm.CTA of the head showedpaucity of distal LEFT MCA vessels associated with acute small LEFT frontoparietal/MCA territory nonhemorrhagic infarct. Stroke swallow screen was deferred as pt was not managing secretions.   Assessment / Plan / Recommendation Clinical Impression  Patient presents with signs of oropharyngeal dysphagia due to CN VII, X, XII weakness. He has severe expressive aphasia; pt's speech is dysarthric and hypernasal. Intermittently wet vocal quality at baseline, suggestive of aspiration of secretions. Oral phase impaired, with mild anterior loss with ice chips due to decreased labial seal and lingual incoordination. With 1/2  teaspoon of water, pt coughs immediately, suggestive of airway compromise. There are no overt signs of aspiration with puree, although there is mild right-sided retention/residue. Recommend proceeding with MBS prior to initiating diet as I strongly suspect aspiration of at least thin liquids. Pt in agreement. Will perform later this morning.    SLP Visit Diagnosis: Dysphagia, oropharyngeal phase (R13.12)    Aspiration Risk  Moderate aspiration risk    Diet Recommendation NPO   Medication Administration: Via alternative means    Other  Recommendations Oral Care Recommendations: Oral care QID Other Recommendations: Have oral suction available   Follow up Recommendations Inpatient Rehab      Frequency and Duration min 3x week  2 weeks       Prognosis Prognosis for Safe Diet Advancement: Good Barriers to Reach Goals: Language deficits;Severity of deficits      Swallow Study   General Date of Onset: 08/06/17 HPI: William Patel an 84 y.o.malewith medical history significant forcolon cancer status post resection, hypertension, CKD IIIand chronic diastolic CHF who [facility] with acute onset right sided weakness and aphasia. Immediate CT headshowed disproportionately dense LEFT M2 versus M3 MCA, possible thromboembolism in a background of probable hemoconcentration.Patient given immediate TPAAt 1745pm.CTA of the head showedpaucity of distal LEFT MCA vessels associated with acute small LEFT frontoparietal/MCA territory nonhemorrhagic infarct. Stroke swallow screen was deferred as pt was not managing secretions. Type of Study: Bedside Swallow Evaluation Previous Swallow Assessment: none in chart, pt nods to confirm he has had trouble swallowing in the past but unable to detail due to aphasia Diet Prior to this Study: NPO Temperature Spikes Noted: No Respiratory Status: Room air History  of Recent Intubation: No Behavior/Cognition: Alert;Cooperative Oral  Cavity Assessment: Excessive secretions Oral Care Completed by SLP: Yes Oral Cavity - Dentition: Edentulous;Dentures, not available Vision: Functional for self-feeding Self-Feeding Abilities: Needs assist Patient Positioning: Upright in bed Baseline Vocal Quality: Low vocal intensity;Suspected CN X (Vagus) involvement;Wet(hypernasal, low pitch) Volitional Cough: Weak Volitional Swallow: Able to elicit    Oral/Motor/Sensory Function Overall Oral Motor/Sensory Function: Moderate impairment Facial ROM: Reduced right;Suspected CN VII (facial) dysfunction Facial Symmetry: Abnormal symmetry right;Suspected CN VII (facial) dysfunction Facial Strength: Reduced right;Suspected CN VII (facial) dysfunction Lingual Symmetry: Abnormal symmetry right;Suspected CN XII (hypoglossal) dysfunction Lingual Strength: Reduced;Suspected CN XII (hypoglossal) dysfunction Velum: Impaired right;Suspected CN X (Vagus) dysfunction Mandible: Within Functional Limits   Ice Chips Ice chips: Impaired Presentation: Spoon Oral Phase Impairments: Reduced lingual movement/coordination;Reduced labial seal Oral Phase Functional Implications: Right anterior spillage;Prolonged oral transit Pharyngeal Phase Impairments: Throat Clearing - Delayed   Thin Liquid Thin Liquid: Impaired Presentation: Spoon Oral Phase Impairments: Reduced labial seal;Reduced lingual movement/coordination Oral Phase Functional Implications: Prolonged oral transit Pharyngeal  Phase Impairments: Cough - Immediate    Nectar Thick Nectar Thick Liquid: Not tested   Honey Thick Honey Thick Liquid: Not tested   Puree Puree: Impaired Presentation: Spoon Oral Phase Impairments: Reduced labial seal;Reduced lingual movement/coordination Oral Phase Functional Implications: Prolonged oral transit;Right lateral sulci pocketing;Oral residue   Solid   GO   Solid: Not tested       [name], Helen, CCC-SLP Speech-Language Pathologist [place] 08/07/2017,10:54 AM

## 2017-08-07 NOTE — Progress Notes (Signed)
ANTICOAGULATION CONSULT NOTE - Initial Consult  Pharmacy Consult for Heparin Indication: stroke  Allergies  Allergen Reactions  . Tramadol Other (See Comments)    Causes headaches    Patient Measurements: Weight: 129 lb 3 oz (58.6 kg)  Vital Signs: Temp: 98.8 F (37.1 C) (03/09 1700) Temp Source: Oral (03/09 1700) BP: 140/83 (03/09 1700) Pulse Rate: 84 (03/09 1600)  Labs: Recent Labs    08/06/17 1736 08/06/17 1742 08/07/17 0857 08/07/17 1438  HGB 16.3 16.0  --   --   HCT 48.0 46.2  --   --   PLT  --  147*  --   --   APTT  --  29  --   --   LABPROT  --  13.2  --   --   INR  --  1.01  --   --   CREATININE 1.50* 1.67*  --   --   TROPONINI  --   --  <0.03 <0.03    CrCl cannot be calculated (Unknown ideal weight.).   Medical History: Past Medical History:  Diagnosis Date  . Colon cancer ([place])    Left   . Generalized headaches   . Hematuria   . Hypertension 03/27/2016  . Leg swelling occasional   both legs  . Rectal bleeding    small amount, none current  . Vertigo   . Visual disturbances     Assessment: 84 year old male s/p TPA 3/8 to begin heparin for CVA  Goal of Therapy:  Heparin level 0.3-0.5 units/ml Monitor platelets by anticoagulation protocol: Yes   Plan:  Heparin at 600 units / hr Daily heparin level, CBC  Thank you Anette Guarneri, PharmD [phone]  08/07/2017,5:17 PM

## 2017-08-07 NOTE — ED Provider Notes (Signed)
[facility] NEURO/TRAUMA/SURGICAL ICU Provider Note   CSN: [id number] Arrival date & time: 08/06/17  1733   An emergency department physician performed an initial assessment on this suspected stroke patient at 1725.  History   Chief Complaint Chief Complaint  Patient presents with  . Code Stroke    HPI William Patel is a 84 y.o. male.  HPI   84 year old male with a history of CHF, hypertension, CKD who presents as a code stroke.  Patient was at a [employer] with a friend several and he got in the car to drive, and his he began to back out he suddenly developed right-sided weakness and difficulty speaking.  Last known normal was 440 or 445.  Patient unable to provide history given his aphasia.  Past Medical History:  Diagnosis Date  . Colon cancer ([place])    Left   . Generalized headaches   . Hematuria   . Hypertension 03/27/2016  . Leg swelling occasional   both legs  . Rectal bleeding    small amount, none current  . Vertigo   . Visual disturbances     Patient Active Problem List   Diagnosis Date Noted  . Stroke (cerebrum) ([place]) 08/06/2017  . AKI (acute kidney injury) ([place]) 03/27/2016  . CKD (chronic kidney disease), stage II 03/27/2016  . Hypertension 03/27/2016  . Chronic diastolic CHF (congestive heart failure) ([place]) 03/27/2016  . Chest pain, midsternal 06/14/2012  . Abdominal pain- LLQ 06/14/2012  . Near syncope 06/14/2012  . Vertigo 06/14/2012  . Diverticulitis of sigmoid colon 06/14/2012  . PVC (premature ventricular contraction) 08/13/2011  . Pre-operative cardiovascular examination 08/13/2011  . Colon cancer-proximal descending left colon 08/06/2011  . Tobacco use-chews 08/06/2011    Past Surgical History:  Procedure Laterality Date  . COLON RESECTION  09/21/2011   Procedure: COLON RESECTION LAPAROSCOPIC;  Surgeon: Pedro Earls, MD;  Location: [facility] ORS;  Service: General;  Laterality: N/A;     . COLONOSCOPY  feb 2013   endo done also        Home Medications    Prior to Admission medications   Medication Sig Start Date End Date Taking? Authorizing Provider  fluticasone (FLONASE) 50 MCG/ACT nasal spray Place 2 sprays into both nostrils at bedtime as needed for allergies. 03/11/16   [provider]  lidocaine (LIDODERM) 5 % Place 1 patch onto the skin daily. Remove & Discard patch within 12 hours or as directed by MD 09/16/14   Margarita Mail, PA-C  LORazepam (ATIVAN) 0.5 MG tablet Take 1 tablet (0.5 mg total) by mouth every 8 (eight) hours as needed (Dizziness). 12/24/13   Daleen Bo, MD  sertraline (ZOLOFT) 25 MG tablet Take 25 mg by mouth daily. 03/18/16   [provider]    Family History Family History  Problem Relation Age of Onset  . Heart failure Mother   . Brain cancer Mother   . Brain cancer Father     Social History Social History   Tobacco Use  . Smoking status: Current Some Day Smoker    Types: Cigars  . Smokeless tobacco: Current User    Types: Chew  Substance Use Topics  . Alcohol use: Yes    Alcohol/week: 1.8 oz    Types: 3 Cans of beer per week  . Drug use: No     Allergies   Tramadol   Review of Systems Review of Systems  Unable to perform ROS: Mental status change  Neurological: Positive for speech difficulty and  weakness.     Physical Exam Updated Vital Signs BP 109/78   Pulse 81   Temp 99.2 F (37.3 C)   Resp 19   Wt 58.6 kg (129 lb 3 oz)   SpO2 99%   BMI 23.63 kg/m   Physical Exam  Constitutional: He appears well-developed and well-nourished. No distress.  HENT:  Head: Normocephalic and atraumatic.  Eyes: Conjunctivae and EOM are normal.  Neck: Normal range of motion.  Cardiovascular: Normal rate, regular rhythm, normal heart sounds and intact distal pulses.  Pulmonary/Chest: Effort normal and breath sounds normal. No respiratory distress. He has no wheezes. He has no rales.  Abdominal: Soft. He exhibits no distension. There is no  tenderness. There is no guarding.  Musculoskeletal: He exhibits no edema.  Neurological: He is alert. GCS eye subscore is 4. GCS motor subscore is 6.  Alert on arrival, not speaking with aphasia Right facial droop Right upper extremity weakness severe Mild right lower extremity weakness    Skin: Skin is warm and dry. He is not diaphoretic.  Nursing note and vitals reviewed.    ED Treatments / Results  Labs (all labs ordered are listed, but only abnormal results are displayed) Labs Reviewed  CBC - Abnormal; Notable for the following components:      Result Value   Platelets 147 (*)    All other components within normal limits  COMPREHENSIVE METABOLIC PANEL - Abnormal; Notable for the following components:   Glucose, Bld 171 (*)    Creatinine, Ser 1.67 (*)    Total Protein 6.2 (*)    ALT 13 (*)    GFR calc non Af Amer 37 (*)    GFR calc Af Amer 43 (*)    All other components within normal limits  I-STAT CHEM 8, ED - Abnormal; Notable for the following components:   Potassium 5.6 (*)    BUN 32 (*)    Creatinine, Ser 1.50 (*)    Glucose, Bld 144 (*)    Calcium, Ion 1.07 (*)    All other components within normal limits  MRSA PCR SCREENING  PROTIME-INR  APTT  DIFFERENTIAL  FOLATE RBC  HEMOGLOBIN A1C  LIPID PANEL  I-STAT TROPONIN, ED  CBG MONITORING, ED    EKG  EKG Interpretation  Date/Time:  Friday August 06 2017 17:57:57 EST Ventricular Rate:  97 PR Interval:    QRS Duration: 77 QT Interval:  341 QTC Calculation: 434 R Axis:   85 Text Interpretation:  Sinus rhythm Probable left atrial enlargement Borderline right axis deviation No significant change since last tracing Confirmed by Gareth Morgan [phone]) on 08/07/2017 2:17:19 AM       Radiology Ct Angio Head W Or Wo Contrast  Result Date: 08/06/2017 CLINICAL DATA:  RIGHT facial droop, aphasia. Last seen normal at 1645 hours. Status post tPA. EXAM: CT ANGIOGRAPHY HEAD AND NECK TECHNIQUE: Multidetector CT  imaging of the head and neck was performed using the standard protocol during bolus administration of intravenous contrast. Multiplanar CT image reconstructions and MIPs were obtained to evaluate the vascular anatomy. Carotid stenosis measurements (when applicable) are obtained utilizing NASCET criteria, using the distal internal carotid diameter as the denominator. CONTRAST:  71mL ISOVUE-370 IOPAMIDOL (ISOVUE-370) INJECTION 76% COMPARISON:  CT HEAD August 06, 2017 at 1737 hours FINDINGS: CTA NECK FINDINGS: AORTIC ARCH: Normal appearance of the thoracic arch, normal branch pattern. Trace calcific atherosclerosis aortic arch. The origins of the innominate, left Common carotid artery and subclavian artery are widely patent. RIGHT  CAROTID SYSTEM: Common carotid artery is widely patent. Trace calcific atherosclerosis in intimal thickening carotid bifurcation without hemodynamically significant stenosis by NASCET criteria. Normal patent internal carotid artery with mild luminal irregularity most compatible with atherosclerosis. LEFT CAROTID SYSTEM: Common carotid artery is widely patent. Approximately 2 cm of the origin is a 1 cm segment of focal intimal thickening/hematoma with mobile soft tissue plaque extending into the central lumen, 4 mm superior migration. VERTEBRAL ARTERIES:Left vertebral artery is dominant. Normal appearance of the vertebral arteries, widely patent. SKELETON: No acute osseous process though bone windows have not been submitted. Severe C4-5 and C5-6 spondylosis. Severe bilateral C4-5 neural foraminal narrowing. Patient is edentulous. OTHER NECK: Soft tissues of the neck are nonacute though, not tailored for evaluation. UPPER CHEST: Included lung apices are clear. No superior mediastinal lymphadenopathy. CTA HEAD FINDINGS: ANTERIOR CIRCULATION: Patent cervical internal carotid arteries, petrous, cavernous and supra clinoid internal carotid arteries. Patent anterior communicating artery. Patent  anterior and middle cerebral arteries. Mild stenosis LEFT M1 origin. However, decreased distal LEFT frontoparietal MCA vessels with new loss of gray-white matter junction about the central sulcus. No large vessel occlusion, significant stenosis, contrast extravasation or aneurysm. POSTERIOR CIRCULATION: Patent vertebral arteries, vertebrobasilar junction and basilar artery, as well as main branch vessels. Patent posterior cerebral arteries. Fetal origin LEFT posterior cerebral artery. No large vessel occlusion, significant stenosis, contrast extravasation or aneurysm. VENOUS SINUSES: Major dural venous sinuses are patent though not tailored for evaluation on this angiographic examination. ANATOMIC VARIANTS: Supernumerary anterior cerebral artery arising from LEFT A1-2 junction. DELAYED PHASE: Not performed. MIP images reviewed. IMPRESSION: CTA NECK: 1. LEFT internal carotid artery 1 cm intimal hematoma approximately 2 cm from the origin with mobile low-density plaque/hematoma in central lumen, at risk for embolization. Approximately 55% stenosis LEFT ICA. 2. Severe bilateral C4-5 neural foraminal narrowing. CTA HEAD: 1. No emergent large vessel occlusion. However, paucity of distal LEFT MCA vessels associated with acute small LEFT frontoparietal/MCA territory nonhemorrhagic infarct. Critical Value/emergent results were called by telephone at the time of interpretation on 08/06/2017 at 6:15 pm to Dr. Leonel Ramsay, Neurology, who verbally acknowledged these results. Electronically Signed   By: Elon Alas M.D.   On: 08/06/2017 18:19   Ct Angio Neck W Or Wo Contrast  Result Date: 08/06/2017 CLINICAL DATA:  RIGHT facial droop, aphasia. Last seen normal at 1645 hours. Status post tPA. EXAM: CT ANGIOGRAPHY HEAD AND NECK TECHNIQUE: Multidetector CT imaging of the head and neck was performed using the standard protocol during bolus administration of intravenous contrast. Multiplanar CT image reconstructions and MIPs  were obtained to evaluate the vascular anatomy. Carotid stenosis measurements (when applicable) are obtained utilizing NASCET criteria, using the distal internal carotid diameter as the denominator. CONTRAST:  48mL ISOVUE-370 IOPAMIDOL (ISOVUE-370) INJECTION 76% COMPARISON:  CT HEAD August 06, 2017 at 1737 hours FINDINGS: CTA NECK FINDINGS: AORTIC ARCH: Normal appearance of the thoracic arch, normal branch pattern. Trace calcific atherosclerosis aortic arch. The origins of the innominate, left Common carotid artery and subclavian artery are widely patent. RIGHT CAROTID SYSTEM: Common carotid artery is widely patent. Trace calcific atherosclerosis in intimal thickening carotid bifurcation without hemodynamically significant stenosis by NASCET criteria. Normal patent internal carotid artery with mild luminal irregularity most compatible with atherosclerosis. LEFT CAROTID SYSTEM: Common carotid artery is widely patent. Approximately 2 cm of the origin is a 1 cm segment of focal intimal thickening/hematoma with mobile soft tissue plaque extending into the central lumen, 4 mm superior migration. VERTEBRAL ARTERIES:Left vertebral artery is dominant.  Normal appearance of the vertebral arteries, widely patent. SKELETON: No acute osseous process though bone windows have not been submitted. Severe C4-5 and C5-6 spondylosis. Severe bilateral C4-5 neural foraminal narrowing. Patient is edentulous. OTHER NECK: Soft tissues of the neck are nonacute though, not tailored for evaluation. UPPER CHEST: Included lung apices are clear. No superior mediastinal lymphadenopathy. CTA HEAD FINDINGS: ANTERIOR CIRCULATION: Patent cervical internal carotid arteries, petrous, cavernous and supra clinoid internal carotid arteries. Patent anterior communicating artery. Patent anterior and middle cerebral arteries. Mild stenosis LEFT M1 origin. However, decreased distal LEFT frontoparietal MCA vessels with new loss of gray-white matter junction about  the central sulcus. No large vessel occlusion, significant stenosis, contrast extravasation or aneurysm. POSTERIOR CIRCULATION: Patent vertebral arteries, vertebrobasilar junction and basilar artery, as well as main branch vessels. Patent posterior cerebral arteries. Fetal origin LEFT posterior cerebral artery. No large vessel occlusion, significant stenosis, contrast extravasation or aneurysm. VENOUS SINUSES: Major dural venous sinuses are patent though not tailored for evaluation on this angiographic examination. ANATOMIC VARIANTS: Supernumerary anterior cerebral artery arising from LEFT A1-2 junction. DELAYED PHASE: Not performed. MIP images reviewed. IMPRESSION: CTA NECK: 1. LEFT internal carotid artery 1 cm intimal hematoma approximately 2 cm from the origin with mobile low-density plaque/hematoma in central lumen, at risk for embolization. Approximately 55% stenosis LEFT ICA. 2. Severe bilateral C4-5 neural foraminal narrowing. CTA HEAD: 1. No emergent large vessel occlusion. However, paucity of distal LEFT MCA vessels associated with acute small LEFT frontoparietal/MCA territory nonhemorrhagic infarct. Critical Value/emergent results were called by telephone at the time of interpretation on 08/06/2017 at 6:15 pm to Dr. Leonel Ramsay, Neurology, who verbally acknowledged these results. Electronically Signed   By: Elon Alas M.D.   On: 08/06/2017 18:19   Ct Head Code Stroke Wo Contrast  Result Date: 08/06/2017 CLINICAL DATA:  Code stroke. Altered level of consciousness. History of colon cancer, vertigo. EXAM: CT HEAD WITHOUT CONTRAST TECHNIQUE: Contiguous axial images were obtained from the base of the skull through the vertex without intravenous contrast. COMPARISON:  CT HEAD March 27, 2016 FINDINGS: Mild motion degraded examination. BRAIN: No intraparenchymal hemorrhage, mass effect nor midline shift. The ventricles and sulci are normal for age. Patchy supratentorial white matter hypodensities less  than expected for patient's age, though non-specific are most compatible with chronic small vessel ischemic disease. RIGHT inferior basal ganglia unchanged perivascular space. No acute large vascular territory infarcts. No abnormal extra-axial fluid collections. Basal cisterns are patent. VASCULAR: Mild calcific atherosclerosis of the carotid siphons. Mildly dense intracranial vessels seen with hemoconcentration. However, slight lead disproportionate lead dense LEFT insular MCA. SKULL: No skull fracture. No significant scalp soft tissue swelling. SINUSES/ORBITS: The mastoid air-cells and included paranasal sinuses are well-aerated.The included ocular globes and orbital contents are non-suspicious. OTHER: None. ASPECTS [facility] Stroke Program Early CT Score) - Ganglionic level infarction (caudate, lentiform nuclei, internal capsule, insula, M1-M3 cortex): 7 - Supraganglionic infarction (M4-M6 cortex): 3 Total score (0-10 with 10 being normal): 10 IMPRESSION: 1. Disproportionately dense LEFT M2 versus M3 MCA, possible thromboembolism in a background of probable hemoconcentration. 2. ASPECTS is 10. 3. Critical Value/emergent results were called by telephone at the time of interpretation on 08/06/2017 at 5:53 pm to Dr. Leonel Ramsay, Neurology, who verbally acknowledged these results. Electronically Signed   By: Elon Alas M.D.   On: 08/06/2017 17:54    Procedures .Critical Care Performed by: Gareth Morgan, MD Authorized by: Gareth Morgan, MD   Critical care provider statement:    Critical care time (minutes):  30   Critical care was necessary to treat or prevent imminent or life-threatening deterioration of the following conditions:  CNS failure or compromise   Critical care was time spent personally by me on the following activities:  Discussions with consultants, examination of patient and ordering and review of radiographic studies   (including critical care time)  Medications Ordered in  ED Medications   stroke: mapping our early stages of recovery book (not administered)  0.9 %  sodium chloride infusion (not administered)  acetaminophen (TYLENOL) tablet 650 mg (not administered)    Or  acetaminophen (TYLENOL) solution 650 mg (not administered)    Or  acetaminophen (TYLENOL) suppository 650 mg (not administered)  senna-docusate (Senokot-S) tablet 1 tablet (not administered)  pantoprazole (PROTONIX) injection 40 mg (40 mg Intravenous Given 08/06/17 2202)  labetalol (NORMODYNE,TRANDATE) injection 20 mg (not administered)    And  nicardipine (CARDENE) 20mg  in 0.86% saline 236ml IV infusion (0.1 mg/ml) (not administered)  fluticasone (FLONASE) 50 MCG/ACT nasal spray 2 spray (not administered)  sertraline (ZOLOFT) tablet 25 mg (not administered)  lidocaine (LIDODERM) 5 % 1 patch (not administered)  alteplase (ACTIVASE) 1 mg/mL infusion 53 mg (0 mg Intravenous Stopped 08/06/17 1845)  iopamidol (ISOVUE-370) 76 % injection 50 mL (50 mLs Intravenous Contrast Given 08/06/17 1754)     Initial Impression / Assessment and Plan / ED Course  I have reviewed the triage vital signs and the nursing notes.  Pertinent labs & imaging results that were available during my care of the patient were reviewed by me and considered in my medical decision making (see chart for details).      84 year old male with a history of CHF, hypertension, CKD who presents as a code stroke.  Glucose within normal limits, CT head without signs of intracranial hemorrhage.  Patient with significant right-sided weakness and aphasia on arrival.  He is protecting his airway.  Concern given history and exam is for acute stroke.  Dr. Leonel Ramsay was able to confirm last known normal with roommates, as well as review possible contraindications to tPA.  Given patient within stroke window, with symptoms consistent with CVA, agree with Dr. Leonel Ramsay and feel emergent administration of tPA is indicated.  Patient admitted to  ICU for continued care.  Final Clinical Impressions(s) / ED Diagnoses   Final diagnoses:  Cerebrovascular accident (CVA), unspecified mechanism [facility])    ED Discharge Orders    None       Gareth Morgan, MD 08/07/17 [phone]

## 2017-08-08 DIAGNOSIS — E785 Hyperlipidemia, unspecified: Secondary | ICD-10-CM

## 2017-08-08 DIAGNOSIS — I6529 Occlusion and stenosis of unspecified carotid artery: Secondary | ICD-10-CM

## 2017-08-08 DIAGNOSIS — F17200 Nicotine dependence, unspecified, uncomplicated: Secondary | ICD-10-CM

## 2017-08-08 LAB — BASIC METABOLIC PANEL
Anion gap: 7 (ref 5–15)
BUN: 24 mg/dL — AB (ref 6–20)
CHLORIDE: 107 mmol/L (ref 101–111)
CO2: 23 mmol/L (ref 22–32)
CREATININE: 1.28 mg/dL — AB (ref 0.61–1.24)
Calcium: 8.7 mg/dL — ABNORMAL LOW (ref 8.9–10.3)
GFR calc non Af Amer: 51 mL/min — ABNORMAL LOW (ref >60)
GFR, EST AFRICAN AMERICAN: 59 mL/min — AB (ref >60)
Glucose, Bld: 107 mg/dL — ABNORMAL HIGH (ref 65–99)
POTASSIUM: 4 mmol/L (ref 3.5–5.1)
SODIUM: 137 mmol/L (ref 135–145)

## 2017-08-08 LAB — HEPARIN LEVEL (UNFRACTIONATED)
HEPARIN UNFRACTIONATED: 0.26 [IU]/mL — AB (ref 0.30–0.70)
HEPARIN UNFRACTIONATED: 0.29 [IU]/mL — AB (ref 0.30–0.70)

## 2017-08-08 LAB — CBC
HCT: 41.7 % (ref 39.0–52.0)
HEMOGLOBIN: 14.6 g/dL (ref 13.0–17.0)
MCH: 33.3 pg (ref 26.0–34.0)
MCHC: 35 g/dL (ref 30.0–36.0)
MCV: 95 fL (ref 78.0–100.0)
PLATELETS: 135 10*3/uL — AB (ref 150–400)
RBC: 4.39 MIL/uL (ref 4.22–5.81)
RDW: 13 % (ref 11.5–15.5)
WBC: 5.8 10*3/uL (ref 4.0–10.5)

## 2017-08-08 LAB — HEMOGLOBIN A1C
Hgb A1c MFr Bld: 4.9 % (ref 4.8–5.6)
Mean Plasma Glucose: 94 mg/dL

## 2017-08-08 MED ORDER — PANTOPRAZOLE SODIUM 40 MG PO TBEC
40.0000 mg | DELAYED_RELEASE_TABLET | Freq: Every day | ORAL | Status: DC
  Administered 2017-08-08 – 2017-08-10 (×3): 40 mg via ORAL
  Filled 2017-08-08 (×3): qty 1

## 2017-08-08 MED ORDER — ATORVASTATIN CALCIUM 40 MG PO TABS
40.0000 mg | ORAL_TABLET | Freq: Every day | ORAL | Status: DC
  Administered 2017-08-08 – 2017-08-09 (×2): 40 mg via ORAL
  Filled 2017-08-08 (×2): qty 1

## 2017-08-08 MED ORDER — LABETALOL HCL 5 MG/ML IV SOLN: 10.0000 mg | INTRAVENOUS | Status: DC | PRN

## 2017-08-08 NOTE — Progress Notes (Signed)
Patient was stable throughout the night. Heparin dose increased from 600 units to 650 units  per pharmacy due to level being undetected in AM labs.

## 2017-08-08 NOTE — Progress Notes (Signed)
ANTICOAGULATION CONSULT NOTE - Follow Up Consult  Pharmacy Consult for heparin Indication: stroke  Labs: Recent Labs    08/06/17 1736 08/06/17 1742 08/07/17 0857 08/07/17 1438 08/07/17 2033 08/08/17 0411  HGB 16.3 16.0  --   --   --  14.6  HCT 48.0 46.2  --   --   --  41.7  PLT  --  147*  --   --   --  135*  APTT  --  29  --   --   --   --   LABPROT  --  13.2  --   --   --   --   INR  --  1.01  --   --   --   --   HEPARINUNFRC  --   --   --   --   --  0.26*  CREATININE 1.50* 1.67*  --   --   --  1.28*  TROPONINI  --   --  <0.03 <0.03 <0.03  --     Assessment: 83yo male subtherapeutic on heparin with initial dosing for stroke.  Goal of Therapy:  Heparin level 0.3-0.5 units/ml   Plan:  Will increase heparin gtt by 1 unit/kg/hr to 650 units/hr and check level in 8 hours.    Wynona Neat, PharmD, BCPS  08/08/2017,5:23 AM

## 2017-08-08 NOTE — Plan of Care (Signed)
MCA stroke

## 2017-08-08 NOTE — Progress Notes (Signed)
[place] for Heparin Indication: stroke  Allergies  Allergen Reactions  . Tramadol Other (See Comments)    Causes headaches    Patient Measurements: Weight: 129 lb 3 oz (58.6 kg)  Vital Signs: Temp: 98.1 F (36.7 C) (03/10 1433) Temp Source: Oral (03/10 1433) BP: 135/79 (03/10 1433) Pulse Rate: 89 (03/10 1433)  Labs: Recent Labs    08/06/17 1736 08/06/17 1742 08/07/17 0857 08/07/17 1438 08/07/17 2033 08/08/17 0411 08/08/17 1347  HGB 16.3 16.0  --   --   --  14.6  --   HCT 48.0 46.2  --   --   --  41.7  --   PLT  --  147*  --   --   --  135*  --   APTT  --  29  --   --   --   --   --   LABPROT  --  13.2  --   --   --   --   --   INR  --  1.01  --   --   --   --   --   HEPARINUNFRC  --   --   --   --   --  0.26* 0.29*  CREATININE 1.50* 1.67*  --   --   --  1.28*  --   TROPONINI  --   --  <0.03 <0.03 <0.03  --   --     CrCl cannot be calculated (Unknown ideal weight.).   Medical History: Past Medical History:  Diagnosis Date  . Colon cancer ([place])    Left   . Generalized headaches   . Hematuria   . Hypertension 03/27/2016  . Leg swelling occasional   both legs  . Rectal bleeding    small amount, none current  . Vertigo   . Visual disturbances     Assessment: 84 year old male s/p TPA 3/8 to begin heparin for CVA Heparin level = 0.29  Goal of Therapy:  Heparin level 0.3-0.5 units/ml Monitor platelets by anticoagulation protocol: Yes   Plan:  Heparin to 750 units / hr Daily heparin level, CBC  Thank you Anette Guarneri, PharmD [phone]  08/08/2017,3:09 PM

## 2017-08-08 NOTE — Progress Notes (Signed)
Patient transferred to room 3W11 from ICU. He is stable at this time. Denies pain. Family at bedside. SR on monitor. Patient sitting up in chair at this time. Will continue to monitor closely.

## 2017-08-08 NOTE — Progress Notes (Signed)
STROKE TEAM PROGRESS NOTE   SUBJECTIVE (INTERVAL HISTORY) His RN is at the bedside.  Patient is awake, alert, sitting in bed for breakfast, still has expressive aphasia, and right arm paresis.  MRI pending showed small left perirolandic infarct and punctate left MCA infarcts. BP stable. Started on heparin drip, level still low.   OBJECTIVE Temp:  [98.6 F (37 C)-99.2 F (37.3 C)] 98.8 F (37.1 C) (03/10 0739) Pulse Rate:  [58-91] 64 (03/10 0900) Cardiac Rhythm: Normal sinus rhythm (03/10 0800) Resp:  [16-27] 16 (03/10 0900) BP: (88-140)/(44-97) 126/74 (03/10 0900) SpO2:  [95 %-100 %] 97 % (03/10 0900)  CBC:  Recent Labs  Lab 08/06/17 1742 08/08/17 0411  WBC 7.8 5.8  NEUTROABS 5.8  --   HGB 16.0 14.6  HCT 46.2 41.7  MCV 93.9 95.0  PLT 147* 135*    Basic Metabolic Panel:  Recent Labs  Lab 08/06/17 1742 08/08/17 0411  NA 135 137  K 4.1 4.0  CL 103 107  CO2 22 23  GLUCOSE 171* 107*  BUN 20 24*  CREATININE 1.67* 1.28*  CALCIUM 9.0 8.7*    Lipid Panel:     Component Value Date/Time   CHOL 208 (H) 08/07/2017 0259   TRIG 91 08/07/2017 0259   HDL 54 08/07/2017 0259   CHOLHDL 3.9 08/07/2017 0259   VLDL 18 08/07/2017 0259   LDLCALC 136 (H) 08/07/2017 0259   HgbA1c: No results found for: HGBA1C Urine Drug Screen: No results found for: LABOPIA, COCAINSCRNUR, LABBENZ, AMPHETMU, THCU, LABBARB  Alcohol Level     Component Value Date/Time   [facility]  06/01/2010 1508    <5        LOWEST DETECTABLE LIMIT FOR SERUM ALCOHOL IS 5 mg/dL FOR MEDICAL PURPOSES ONLY    IMAGING I have personally reviewed the radiological images below and agree with the radiology interpretations.  Ct Angio Head W Or Wo Contrast Ct Angio Neck W Or Wo Contrast 08/06/2017  CTA NECK:  1. LEFT internal carotid artery 1 cm intimal hematoma approximately 2 cm from the origin with mobile low-density plaque/hematoma in central lumen, at risk for embolization. Approximately 55% stenosis LEFT ICA.  2.  Severe bilateral C4-5 neural foraminal narrowing.  CTA HEAD:  1. No emergent large vessel occlusion. However, paucity of distal LEFT MCA vessels associated with acute small LEFT frontoparietal/MCA territory nonhemorrhagic infarct.   Ct Head Code Stroke Wo Contrast 08/06/2017 IMPRESSION:  1. Disproportionately dense LEFT M2 versus M3 MCA, possible thromboembolism in a background of probable hemoconcentration.  2. ASPECTS is 10.   MRI 1. Moderate acute left perirolandic infarct. Punctate recent infarcts in the left caudate head and left occipital parietal cortex. These could all be explained by the patient's left cervical ICA thrombus. 2. Partial and motion degraded study.  TTE - Left ventricle: The cavity size was normal. Wall thickness was   normal. Systolic function was vigorous. The estimated ejection   fraction was in the range of 65% to 70%. Wall motion was normal;   there were no regional wall motion abnormalities. Doppler   parameters are consistent with abnormal left ventricular   relaxation (grade 1 diastolic dysfunction). - Aortic valve: There was trivial regurgitation.   PHYSICAL EXAM Vitals:   08/08/17 0700 08/08/17 0739 08/08/17 0800 08/08/17 0900  BP: (!) 93/49  111/60 126/74  Pulse: (!) 58  (!) 58 64  Resp: 16  16 16   Temp:  98.8 F (37.1 C)    TempSrc:  Oral  SpO2: 97%  98% 97%  Weight:        Temp:  [98.6 F (37 C)-99.2 F (37.3 C)] 98.8 F (37.1 C) (03/10 0739) Pulse Rate:  [58-91] 64 (03/10 0900) Resp:  [16-27] 16 (03/10 0900) BP: (88-140)/(44-97) 126/74 (03/10 0900) SpO2:  [95 %-100 %] 97 % (03/10 0900)  General - Well nourished, well developed, in no apparent distress.  Ophthalmologic - Fundi not visualized due to noncooperation.  Cardiovascular - Regular rate and rhythm.  Neuro - awake, alert, follows simple commands, however expressive aphasia with mumbling sound.  Not able to name or repeat.  PERRL, EOMI, no gaze preference.  Blinking to  visual threat bilaterally.  Right facial droop, tongue midline.  Right upper extremity 3/5, right lower extremity 5-/5, left upper extremity and lower extremity 5/5.  DTR 1+, no Babinski.  Sensation symmetrical, coordination not cooperative, gait not tested.   ASSESSMENT/PLAN William Patel is a 84 y.o. male with history of hypertension and colon cancer, presenting with left gaze preference, right sided weakness, and severe aphasia.  The patient received IV TPA on Friday 08/06/2017 at 1745.  Stroke - left MCA infarct embolic likely due to left ICA soft plaque with intimal hematoma    Resultant  Expressive aphasia, right facial droop and right UE weakness  CT head - Disproportionately dense LEFT M2 versus M3 MCA  MRI head - small left perirolandic infarct, punctate left MCA infarcts  CTA H&N - LEFT ICA intimal hematoma approximately 2 cm from the origin with mobile low-density plaque/hematoma in central lumen, at risk for embolization.  2D Echo - EF 65-70%  LDL - 136  HgbA1c - pending  VTE prophylaxis - heparin drip Fall precautions Bleeding precautions DIET - DYS 1 Room service appropriate? Yes; Fluid consistency: Honey Thick  No antithrombotic prior to admission, now on heparin drip.   Patient counseled to be compliant with his antithrombotic medications  Ongoing aggressive stroke risk factor management  Therapy recommendations:  pending  Disposition:  Pending  Left ICA intimal hematoma with mobile density plaque/hematoma in central lumen  Risk for embolization  S/p tPA  Now on heparin drip after tPA   May consider repeat CTA neck next week  May consider DAPT on discharge.   Hypertension  Blood pressure tends to run low  Permissive hypertension (OK if < 180/105) but gradually normalize in 5-7 days  Long-term BP goal normotensive  Hyperlipidemia  Home meds: No lipid lowering medications prior to admission  LDL 136, goal < 70  On lipitor  40  Continue statin at discharge  Tobacco abuse  Current smoker  Smoking cessation counseling provided  Pt is willing to quit  Other Stroke Risk Factors  Advanced age  CHF - diastolic   ETOH use, advised to drink no more than 1 drink per day.  Other Active Problems  CKD III - Cre 1.67->1.28  Colon Ca s/p surgery  Hospital day # 2  This patient is critically ill due to left MCA infarct s/p tPA, left ICA mobile plaque, dysphagia and at significant risk of neurological worsening, death form recurrent stroke, hemorrhagic conversion, respiration pneumonia, seizure. This patient's care requires constant monitoring of vital signs, hemodynamics, respiratory and cardiac monitoring, review of multiple databases, neurological assessment, discussion with family, other specialists and medical decision making of high complexity. I spent 35 minutes of neurocritical care time in the care of this patient.  William Hawking, MD PhD Stroke Neurology 08/08/2017 10:06 AM   To contact  Stroke Continuity provider, please refer to [email or web address] After hours, contact General Neurology

## 2017-08-08 NOTE — Progress Notes (Signed)
OT Cancellation Note  Patient Details Name: William Patel MRN: [id number] DOB: Dec 21, 1933   Cancelled Treatment:    Reason Eval/Treat Not Completed: Active bedrest order.  Binnie Kand M.S., OTR/L Pager: [phone]  08/08/2017, 7:49 AM

## 2017-08-09 LAB — CBC
HCT: 40.2 % (ref 39.0–52.0)
Hemoglobin: 13.7 g/dL (ref 13.0–17.0)
MCH: 32.2 pg (ref 26.0–34.0)
MCHC: 34.1 g/dL (ref 30.0–36.0)
MCV: 94.6 fL (ref 78.0–100.0)
PLATELETS: 125 10*3/uL — AB (ref 150–400)
RBC: 4.25 MIL/uL (ref 4.22–5.81)
RDW: 12.9 % (ref 11.5–15.5)
WBC: 5.3 10*3/uL (ref 4.0–10.5)

## 2017-08-09 LAB — BASIC METABOLIC PANEL
Anion gap: 10 (ref 5–15)
BUN: 21 mg/dL — AB (ref 6–20)
CALCIUM: 9 mg/dL (ref 8.9–10.3)
CO2: 24 mmol/L (ref 22–32)
CREATININE: 1.34 mg/dL — AB (ref 0.61–1.24)
Chloride: 105 mmol/L (ref 101–111)
GFR calc Af Amer: 56 mL/min — ABNORMAL LOW (ref >60)
GFR, EST NON AFRICAN AMERICAN: 48 mL/min — AB (ref >60)
Glucose, Bld: 98 mg/dL (ref 65–99)
Potassium: 4 mmol/L (ref 3.5–5.1)
SODIUM: 139 mmol/L (ref 135–145)

## 2017-08-09 LAB — FOLATE RBC
Folate, Hemolysate: 398.6 ng/mL
Folate, RBC: 882 ng/mL (ref >498)
Hematocrit: 45.2 % (ref 37.5–51.0)

## 2017-08-09 LAB — GLUCOSE, CAPILLARY: Glucose-Capillary: 150 mg/dL — ABNORMAL HIGH (ref 65–99)

## 2017-08-09 LAB — HEMATOLOGY COMMENTS:

## 2017-08-09 LAB — HEPARIN LEVEL (UNFRACTIONATED): HEPARIN UNFRACTIONATED: 0.54 [IU]/mL (ref 0.30–0.70)

## 2017-08-09 MED ORDER — WARFARIN - PHARMACIST DOSING INPATIENT: Freq: Every day | Status: DC

## 2017-08-09 MED ORDER — WARFARIN SODIUM 5 MG PO TABS
5.0000 mg | ORAL_TABLET | Freq: Once | ORAL | Status: AC
  Administered 2017-08-09: 5 mg via ORAL
  Filled 2017-08-09: qty 1

## 2017-08-09 MED ORDER — RESOURCE THICKENUP CLEAR PO POWD
ORAL | Status: DC | PRN
  Filled 2017-08-09: qty 125

## 2017-08-09 MED ORDER — ENOXAPARIN SODIUM 60 MG/0.6ML ~~LOC~~ SOLN
60.0000 mg | Freq: Two times a day (BID) | SUBCUTANEOUS | Status: DC
  Administered 2017-08-09 – 2017-08-10 (×2): 60 mg via SUBCUTANEOUS
  Filled 2017-08-09 (×3): qty 0.6

## 2017-08-09 NOTE — Evaluation (Signed)
Occupational Therapy Evaluation Patient Details Name: William Patel MRN: [id number] DOB: 06-06-1933 Today's Date: 08/09/2017    History of Present Illness Patient is a 84 y/o male who presents with right weakness, dysphagia and dysarthria. NIH:11. Head CT- left M2 vs M3 MCA thromboembolism. MRI head- left perivolandic and punctate MCA infarcts. CTA- left frontopariental infarct. s/p tPA. PMH includes vertigo, HTN, colon ca.    Clinical Impression   Pt with decline in function with ADLs and ADL mobility safety with decreased balance and R UE impairments in AROM, strength, coordination and overall function. R UE with hemiplegia and pt with R peripheral vision impaired. No familt present to determine level of amount pd sup or assist available for pt and pt's lady friend states that he does not live with him. Pt's friend states that e is stubborn and got on his tractor the day after back surgery. Safety is a concern given current deficits of pt. Pt would benefit from acute OT services to address impairments to maximize level of function and safety    Follow Up Recommendations  Outpatient OT/24 hour sup initially (concerned about pt being at home alone with current deficits)   Equipment Recommendations  Tub/shower seat    Recommendations for Other Services       Precautions / Restrictions Precautions Precautions: None Restrictions Weight Bearing Restrictions: No      Mobility Bed Mobility Overal bed mobility: Needs Assistance Bed Mobility: Supine to Sit;Sit to Supine     Supine to sit: Supervision Sit to supine: Supervision   General bed mobility comments: up in chair upon PT arrival.   Transfers Overall transfer level: Needs assistance Equipment used: None Transfers: Sit to/from Stand Sit to Stand: Supervision         General transfer comment: Supervision for safety.     Balance Overall balance assessment: Needs assistance Sitting-balance support: Feet supported;No  upper extremity supported Sitting balance-Leahy Scale: Good     Standing balance support: During functional activity;Bilateral upper extremity supported;Single extremity supported Standing balance-Leahy Scale: Fair Standing balance comment: slight LOB during dynamic standing activity                           ADL either performed or assessed with clinical judgement   ADL Overall ADL's : Needs assistance/impaired Eating/Feeding: Set up;Sitting   Grooming: Wash/dry hands;Wash/dry face;Standing;Min guard;Minimal assistance   Upper Body Bathing: Minimal assistance;Moderate assistance   Lower Body Bathing: Minimal assistance;Moderate assistance   Upper Body Dressing : Minimal assistance;Moderate assistance   Lower Body Dressing: Minimal assistance;Moderate assistance   Toilet Transfer: Supervision/safety;Ambulation;Regular Toilet;Grab bars   Toileting- Clothing Manipulation and Hygiene: Minimal assistance;Sit to/from stand;Moderate assistance   Tub/ Shower Transfer: Supervision/safety;Ambulation;3 in 1     General ADL Comments: educated on compensatory dressing(hemi) techiques. Decreased dynamic balance, R UE hemipegia, R visual deficits     Vision Patient Visual Report: Peripheral vision impairment(R periphery impaired) Vision Assessment?: Vision impaired- to be further tested in functional context     Perception     Praxis      Pertinent Vitals/Pain Pain Assessment: No/denies pain Pain Score: 0-No pain Faces Pain Scale: No hurt Pain Intervention(s): Monitored during session     Hand Dominance Right   Extremity/Trunk Assessment Upper Extremity Assessment Upper Extremity Assessment: Generalized weakness;RUE deficits/detail RUE Deficits / Details: Flaccid distally and in hand. Able to hold arm against gravity when placed there and take resistance. shoulder elevation 2+/5, elbow extension 2+/5, 1/5  elbow flexion, most movement coming from shoulder. No  supination. RUE Sensation: decreased light touch RUE Coordination: decreased fine motor;decreased gross motor   Lower Extremity Assessment Lower Extremity Assessment: RLE deficits/detail RLE Deficits / Details: Grossly ~4/5 throughout.  RLE Sensation: decreased light touch   Cervical / Trunk Assessment Cervical / Trunk Assessment: Normal   Communication Communication Communication: Expressive difficulties   Cognition Arousal/Alertness: Awake/alert Behavior During Therapy: WFL for tasks assessed/performed Overall Cognitive Status: Difficult to assess                                 General Comments: Seems to follow multi step commands without difficulty. A&Ox4. Difficult to comprehend speech due to dysarthria. Some mild right inattention noted??   General Comments  VSS. Mild difficulty managing secretions.    Exercises     Shoulder Instructions      Home Living Family/patient expects to be discharged to:: Private residence Living Arrangements: Alone Available Help at Discharge: Family;Friend(s);Available PRN/intermittently Type of Home: House Home Access: Stairs to enter [employer] of Steps: 3 Entrance Stairs-Rails: Right Home Layout: Two level Alternate Level Stairs-Number of Steps: 12   Bathroom Shower/Tub: Teacher, early years/pre: Standard     Home Equipment: Environmental consultant - 2 wheels;Kasandra Knudsen - single point      Lives With: (has lady friend that does not live with him anymore)    Prior Functioning/Environment Level of Independence: Independent                 OT Problem List: Decreased strength;Decreased activity tolerance;Impaired UE functional use;Impaired tone;Impaired vision/perception;Decreased knowledge of use of DME or AE;Decreased range of motion;Impaired balance (sitting and/or standing);Decreased coordination;Decreased safety awareness;Impaired sensation      OT Treatment/Interventions: Self-care/ADL training;DME  and/or AE instruction;Therapeutic activities;Balance training;Therapeutic exercise;Manual therapy;Neuromuscular education;Visual/perceptual remediation/compensation;Patient/family education    OT Goals(Current goals can be found in the care plan section) Acute Rehab OT Goals Patient Stated Goal: to return to independence OT Goal Formulation: With patient Time For Goal Achievement: 08/23/17 Potential to Achieve Goals: Good ADL Goals Pt Will Perform Grooming: with min guard assist;standing;with caregiver independent in assisting Pt Will Perform Upper Body Bathing: with min assist;with caregiver independent in assisting Pt Will Perform Lower Body Bathing: with min assist;with caregiver independent in assisting Pt Will Perform Upper Body Dressing: with min assist;with caregiver independent in assisting Pt Will Perform Lower Body Dressing: with min assist;with caregiver independent in assisting Pt Will Transfer to Toilet: with supervision;with modified independence;ambulating Pt Will Perform Toileting - Clothing Manipulation and hygiene: with min assist;sit to/from stand;with caregiver independent in assisting Pt Will Perform Tub/Shower Transfer: with supervision;with modified independence;ambulating;shower seat;with caregiver independent in assisting Pt/caregiver will Perform Home Exercise Program: Right Upper extremity;With written HEP provided  OT Frequency: Min 3X/week   Barriers to D/C: Decreased caregiver support  pt lives at home alone, but has lady freind and his dauhgter lives nexxt door       Co-evaluation              AM-PAC PT "6 Clicks" Daily Activity     Outcome Measure Help from another person eating meals?: None Help from another person taking care of personal grooming?: A Little Help from another person toileting, which includes using toliet, bedpan, or urinal?: A Little Help from another person bathing (including washing, rinsing, drying)?: A Lot Help from another  person to put on and taking off regular upper body  clothing?: A Little Help from another person to put on and taking off regular lower body clothing?: A Lot 6 Click Score: 17   End of Session Equipment Utilized During Treatment: Gait belt  Activity Tolerance: Patient tolerated treatment well Patient left: in bed;with call bell/phone within reach;with bed alarm set;with family/visitor present  OT Visit Diagnosis: Unsteadiness on feet (R26.81);Muscle weakness (generalized) (M62.81);Hemiplegia and hemiparesis Hemiplegia - Right/Left: Right Hemiplegia - dominant/non-dominant: Dominant                Time: 3335-4562 OT Time Calculation (min): 25 min Charges:  OT General Charges $OT Visit: 1 Visit OT Evaluation $OT Eval Moderate Complexity: 1 Mod OT Treatments $Therapeutic Activity: 8-22 mins G-Codes: OT G-codes **NOT FOR INPATIENT CLASS** Functional Assessment Tool Used: AM-PAC 6 Clicks Daily Activity     Britt Bottom 08/09/2017, 2:35 PM

## 2017-08-09 NOTE — Progress Notes (Addendum)
ANTICOAGULATION CONSULT NOTE   Pharmacy Consult for Lovenox + Warfarin Indication: carotid artery thrombus  Allergies  Allergen Reactions  . Tramadol Other (See Comments)    Causes headaches    Patient Measurements: Height: 5\' 2"  (157.5 cm) Weight: 126 lb 8.7 oz (57.4 kg) IBW/kg (Calculated) : 54.6   Vital Signs: Temp: 98.5 F (36.9 C) (03/11 1216) Temp Source: Oral (03/11 1216) BP: 118/72 (03/11 1216) Pulse Rate: 68 (03/11 1216)  Labs: Recent Labs    08/06/17 1742 08/07/17 0857 08/07/17 1438 08/07/17 2033 08/08/17 0411 08/08/17 1347 08/09/17 0621  HGB 16.0  --   --   --  14.6  --  13.7  HCT 46.2  --   --   --  41.7  --  40.2  PLT 147*  --   --   --  135*  --  125*  APTT 29  --   --   --   --   --   --   LABPROT 13.2  --   --   --   --   --   --   INR 1.01  --   --   --   --   --   --   HEPARINUNFRC  --   --   --   --  0.26* 0.29* 0.54  CREATININE 1.67*  --   --   --  1.28*  --  1.34*  TROPONINI  --  <0.03 <0.03 <0.03  --   --   --     Estimated Creatinine Clearance: 33.4 mL/min (A) (by C-G formula based on SCr of 1.34 mg/dL (H)).   Medical History: Past Medical History:  Diagnosis Date  . Colon cancer ([place])    Left   . Generalized headaches   . Hematuria   . Hypertension 03/27/2016  . Leg swelling occasional   both legs  . Rectal bleeding    small amount, none current  . Vertigo   . Visual disturbances     Assessment: 84 year old male s/p tPA on 3/8 who has been on IV Heparin drip now  On Lovenox and Warfarin for carotid artery thrombus (day 2 of overlap). Plans noted for home on lovenox bridge if cost can be managed.  -INR= 1.03 -platelet count low/stable  Goal of Therapy:  INR 2-3 Anti-Xa level 0.6-1 units/ml 4hrs after LMWH dose given Monitor platelets by anticoagulation protocol: Yes   Plan:  Continue lovenox 60mg  [place] q12h Coumadin 5mg  po today Daily PT/INR Monitor renal function and need to adjust Lovenox.   Hildred Laser,  PharmD Clinical Pharmacist Clinical phone from 8:30-4:00 is [phone] After 4pm, please call Main Rx [phone]) for assistance. 08/10/2017 11:13 AM

## 2017-08-09 NOTE — Progress Notes (Addendum)
  Speech Language Pathology Treatment: Dysphagia;Cognitive-Linquistic  Patient Details Name: William Patel MRN: [id number] DOB: 1933/08/25 Today's Date: 08/09/2017 Time: 1130-1200 SLP Time Calculation (min) (ACUTE ONLY): 30 min  Assessment / Plan / Recommendation Clinical Impression  DYSPHAGIA: Pt was seen at bedside for follow up after BSE/MBS and SLE completed over the weekend. Pt was seated upright in recliner. SLP brought lunch in and set up tray for pt. He was able to feed himself after set up - he requires assistance with opening containers due to RUE flaccidity. Mild verbal and tactile cues were needed to attend to right anterior leakage. No overt s/s aspiration observed on puree or honey thick liquids. Pt's daughter was present. SLP reviewed results and recommendations of the MBS completed 08/07/17, including rationale for thickened liquids due aspiration of thin and nectar thick liquids with inconsistent cough response. Pt appears to be tolerating current diet. Safe swallow precautions were reviewed with pt/family and posted in pt room.  COMMUNICATION: Pt continues to be profoundly dysarthric and expressively aphasic, but was able to express his desire to "go home". Pt attempts to reduce speech rate and overarticulate, however, intelligibility continues to be markedly impaired. Pt was able to follow commands and answer simple yes/no questions accurately. Higher level auditory comprehension deficits persist. Pt was provided with communication board to facilitate effective communication with family and staff. Continued skilled ST intervention is recommended during acute stay, and following discharge, due to severity of communicative and swallowing disorders. Pt will likely require 24 hour supervision after DC given severity of deficits. Family members were educated regarding therapy options following acute care, and were provided with opportunity to ask questions.     HPI HPI: William Patel an 84 y.o.malewith medical history significant forcolon cancer status post resection, hypertension, CKD IIIand chronic diastolic CHF who [facility] with acute onset right sided weakness and aphasia. Immediate CT headshowed disproportionately dense LEFT M2 versus M3 MCA, possible thromboembolism in a background of probable hemoconcentration.Patient given immediate TPAAt 1745pm.CTA of the head showedpaucity of distal LEFT MCA vessels associated with acute small LEFT frontoparietal/MCA territory nonhemorrhagic infarct. Stroke swallow screen was deferred as pt was not managing secretions.      SLP Plan  Continue with current plan of care       Recommendations  Diet recommendations: Dysphagia 1 (puree);Honey-thick liquid Liquids provided via: Cup;Straw Medication Administration: Crushed with puree Supervision: Patient able to self feed;Staff to assist with self feeding;Intermittent supervision to cue for compensatory strategies Compensations: Slow rate;Small sips/bites;Lingual sweep for clearance of pocketing;Monitor for anterior loss;Multiple dry swallows after each bite/sip;Clear throat intermittently;Minimize environmental distractions Postural Changes and/or Swallow Maneuvers: Out of bed for meals;Seated upright 90 degrees;Upright 30-60 min after meal                General recommendations: Rehab consult Oral Care Recommendations: Oral care QID Follow up Recommendations: Inpatient Rehab SLP Visit Diagnosis: Dysphagia, oropharyngeal phase (R13.12);Cognitive communication deficit (R41.841) Plan: Continue with current plan of care       [place]. Quentin O[facility], CCC-SLP Speech Language Pathologist [phone]  Shonna Chock 08/09/2017, 1:15 PM

## 2017-08-09 NOTE — Evaluation (Signed)
Physical Therapy Evaluation Patient Details Name: William Patel MRN: [id number] DOB: [birth year] Today's Date: 08/09/2017   History of Present Illness  Patient is a 84 y/o male who presents with right weakness, dysphagia and dysarthria. NIH:11. Head CT- left M2 vs M3 MCA thromboembolism. MRI head- left perivolandic and punctate MCA infarcts. CTA- left frontopariental infarct. s/p tPA. PMH includes vertigo, HTN, colon ca.   Clinical Impression  Patient presents with right sided weakness/decreased sensation, dysarthria and impaired mobility s/p above. Tolerated gait training and transfers with supervision for safety. Seems to have more deficits related to expressive components of speech and use of RUE. Some mild right inattention noted vs sensory deficits? Pt independent PTA and lives with a "lady" friend and has a daughter close by. Follows all multi step commands without difficulty. Difficult to comprehend at times due to dysarthria. Will follow acutely to perform higher level balance tasks and maximize independence so pt can return to PLOF.     Follow Up Recommendations Outpatient PT    Equipment Recommendations  None recommended by PT    Recommendations for Other Services       Precautions / Restrictions Precautions Precautions: None Restrictions Weight Bearing Restrictions: No      Mobility  Bed Mobility               General bed mobility comments: up in chair upon PT arrival.   Transfers Overall transfer level: Needs assistance Equipment used: None Transfers: Sit to/from Stand Sit to Stand: Supervision         General transfer comment: Supervision for safety.   Ambulation/Gait Ambulation/Gait assistance: Supervision Ambulation Distance (Feet): 150 Feet Assistive device: None Gait Pattern/deviations: Step-through pattern;Decreased stride length;Drifts right/left Gait velocity: decreased   General Gait Details: Slow, mostly steady gait with mild drifting  noted but no overt LOB. Veers right at times but no bumping into anything.  Stairs            Wheelchair Mobility    Modified Rankin (Stroke Patients Only) Modified Rankin (Stroke Patients Only) Pre-Morbid Rankin Score: No symptoms Modified Rankin: Moderate disability     Balance Overall balance assessment: Needs assistance Sitting-balance support: Feet supported;No upper extremity supported Sitting balance-Leahy Scale: Good     Standing balance support: During functional activity Standing balance-Leahy Scale: Good Standing balance comment: Able to stand at sink and wash hands with assist incorporating RUE.                             Pertinent Vitals/Pain Pain Assessment: Faces Faces Pain Scale: No hurt    Home Living Family/patient expects to be discharged to:: Private residence Living Arrangements: Other (Comment)(with roommate, "lady" relationship) Available Help at Discharge: Family;Friend(s);Available PRN/intermittently(daughter lives next door) Type of Home: House Home Access: Stairs to enter Entrance Stairs-Rails: Right Entrance Stairs-Number of Steps: 3 Home Layout: Two level;Able to live on main level with bedroom/bathroom Home Equipment: Gilford Rile - 2 wheels;Cane - single point      Prior Function Level of Independence: Independent               Hand Dominance   Dominant Hand: Right    Extremity/Trunk Assessment   Upper Extremity Assessment Upper Extremity Assessment: Defer to OT evaluation;RUE deficits/detail RUE Deficits / Details: Flaccid distally and in hand. Able to hold arm against gravity when placed there and take resistance. shoulder elevation 2+/5, elbow extension 2+/5, 1/5 elbow flexion, most movement coming from shoulder.  No supination. RUE Sensation: decreased light touch RUE Coordination: decreased fine motor;decreased gross motor    Lower Extremity Assessment Lower Extremity Assessment: RLE deficits/detail RLE  Deficits / Details: Grossly ~4/5 throughout.  RLE Sensation: decreased light touch       Communication   Communication: Expressive difficulties(dysarthric)  Cognition Arousal/Alertness: Awake/alert Behavior During Therapy: WFL for tasks assessed/performed Overall Cognitive Status: Difficult to assess                                 General Comments: Seems to follow multi step commands without difficulty. A&Ox4. Difficult to comprehend speech due to dysarthria. Some mild right inattention noted??      General Comments General comments (skin integrity, edema, etc.): VSS. Mild difficulty managing secretions.    Exercises     Assessment/Plan    PT Assessment Patient needs continued PT services  PT Problem List Decreased strength;Impaired tone;Decreased range of motion;Impaired sensation       PT Treatment Interventions Balance training;Patient/family education;Gait training;Stair training;Therapeutic exercise;Therapeutic activities;Neuromuscular re-education;Functional mobility training    PT Goals (Current goals can be found in the Care Plan section)  Acute Rehab PT Goals Patient Stated Goal: to return to independence PT Goal Formulation: With patient Time For Goal Achievement: 08/23/17 Potential to Achieve Goals: Good    Frequency Min 4X/week   Barriers to discharge        Co-evaluation               AM-PAC PT "6 Clicks" Daily Activity  Outcome Measure Difficulty turning over in bed (including adjusting bedclothes, sheets and blankets)?: None Difficulty moving from lying on back to sitting on the side of the bed? : None Difficulty sitting down on and standing up from a chair with arms (e.g., wheelchair, bedside commode, etc,.)?: None Help needed moving to and from a bed to chair (including a wheelchair)?: None Help needed walking in hospital room?: A Little Help needed climbing 3-5 steps with a railing? : A Little 6 Click Score: 22    End of  Session Equipment Utilized During Treatment: Gait belt Activity Tolerance: Patient tolerated treatment well Patient left: in chair;with call bell/phone within reach;with chair alarm set Nurse Communication: Mobility status PT Visit Diagnosis: Hemiplegia and hemiparesis;Muscle weakness (generalized) (M62.81) Hemiplegia - Right/Left: Right Hemiplegia - dominant/non-dominant: Dominant Hemiplegia - caused by: Cerebral infarction    Time: 1012-1032 PT Time Calculation (min) (ACUTE ONLY): 20 min   Charges:   PT Evaluation $PT Eval Moderate Complexity: 1 Mod     PT G CodesWray Kearns, PT, DPT [phone]    Marguarite Arbour A Nimesh Riolo 08/09/2017, 11:10 AM

## 2017-08-09 NOTE — Care Management Note (Addendum)
Case Management Note  Patient Details  Name: William Patel MRN: [id number] Date of Birth: [birth year]  Subjective/Objective:   Pt admitted with a stroke. He is from home with friend.                 Action/Plan: PT recommending outpatient therapy. Awaiting OT eval. CM following for d/c needs, physician orders.  Addendum: CM spoke to pt and family and they prefer [facility] services. Daughter states family is available as needed and live close to pt.    Expected Discharge Date:                  Expected Discharge Plan:  OP Rehab  In-House Referral:     Discharge planning Services  CM Consult  Post Acute Care Choice:    Choice offered to:     DME Arranged:    DME Agency:     HH Arranged:    HH Agency:     Status of Service:  In process, will continue to follow  If discussed at Long Length of Stay Meetings, dates discussed:    Additional Comments:  Pollie Friar, RN 08/09/2017, 11:43 AM

## 2017-08-09 NOTE — Research (Signed)
STROKE-AF research study briefly reviewed with patient. Patient still has expressive aphasia, and  Difficult to understand. He seemed interested and I asked what time his family would arrive. He said 12 noon. I told him I would come back to speak with him and his family. Around 12:45 I went back to his room and PT was working with patient and no sign of family. I will return later this evening.

## 2017-08-09 NOTE — Progress Notes (Addendum)
STROKE TEAM PROGRESS NOTE   SUBJECTIVE (INTERVAL HISTORY) His RN is at the bedside.  No family at bedside. Patient is awake, alert, sitting in bed in NAD. Continues to have severe expressive aphasia and right arm paresis.  MRI showed small left perirolandic infarct and punctate left MCA infarcts. BP stable. Started on heparin drip, therapeutic today, with plan for Pharmacy to bridge Lovenox to Coumadin. Awaiting all therapy recs for possible discharge in AM. Case Management to verify Lovenox coverage for insurance.  OBJECTIVE Temp:  [98.1 F (36.7 C)-98.6 F (37 C)] 98.5 F (36.9 C) (03/11 1216) Pulse Rate:  [63-89] 68 (03/11 1216) Cardiac Rhythm: Normal sinus rhythm (03/10 2010) Resp:  [16-20] 18 (03/11 1216) BP: (111-135)/(60-79) 118/72 (03/11 1216) SpO2:  [96 %-100 %] 98 % (03/11 1216) Weight:  [57.4 kg (126 lb 8.7 oz)] 57.4 kg (126 lb 8.7 oz) (03/10 1433)  CBC:  Recent Labs  Lab 08/06/17 1742 08/08/17 0411 08/09/17 0621  WBC 7.8 5.8 5.3  NEUTROABS 5.8  --   --   HGB 16.0 14.6 13.7  HCT 46.2 41.7 40.2  MCV 93.9 95.0 94.6  PLT 147* 135* 125*    Basic Metabolic Panel:  Recent Labs  Lab 08/08/17 0411 08/09/17 0621  NA 137 139  K 4.0 4.0  CL 107 105  CO2 23 24  GLUCOSE 107* 98  BUN 24* 21*  CREATININE 1.28* 1.34*  CALCIUM 8.7* 9.0    Lipid Panel:     Component Value Date/Time   CHOL 208 (H) 08/07/2017 0259   TRIG 91 08/07/2017 0259   HDL 54 08/07/2017 0259   CHOLHDL 3.9 08/07/2017 0259   VLDL 18 08/07/2017 0259   LDLCALC 136 (H) 08/07/2017 0259   HgbA1c:  Lab Results  Component Value Date   HGBA1C 4.9 08/07/2017   IMAGING I have personally reviewed the radiological images below and agree with the radiology interpretations.  Ct Angio Head W Or Wo Contrast Ct Angio Neck W Or Wo Contrast 08/06/2017  CTA NECK:  1. LEFT internal carotid artery 1 cm intimal hematoma approximately 2 cm from the origin with mobile low-density plaque/hematoma in central lumen,  at risk for embolization. Approximately 55% stenosis LEFT ICA.  2. Severe bilateral C4-5 neural foraminal narrowing.  CTA HEAD:  1. No emergent large vessel occlusion. However, paucity of distal LEFT MCA vessels associated with acute small LEFT frontoparietal/MCA territory nonhemorrhagic infarct.   Ct Head Code Stroke Wo Contrast 08/06/2017 IMPRESSION:  1. Disproportionately dense LEFT M2 versus M3 MCA, possible thromboembolism in a background of probable hemoconcentration.  2. ASPECTS is 10.   MRI 1. Moderate acute left perirolandic infarct. Punctate recent infarcts in the left caudate head and left occipital parietal cortex. These could all be explained by the patient's left cervical ICA thrombus. 2. Partial and motion degraded study.  TTE - Left ventricle: The cavity size was normal. Wall thickness was   normal. Systolic function was vigorous. The estimated ejection   fraction was in the range of 65% to 70%. Wall motion was normal;   there were no regional wall motion abnormalities. Doppler   parameters are consistent with abnormal left ventricular   relaxation (grade 1 diastolic dysfunction). - Aortic valve: There was trivial regurgitation.  PHYSICAL EXAM Vitals:   08/09/17 0023 08/09/17 0431 08/09/17 0810 08/09/17 1216  BP: 128/71 115/60 111/63 118/72  Pulse: 77 65 63 68  Resp: 18 18 16 18   Temp: 98.6 F (37 C) 98.4 F (36.9 C) 98.5  F (36.9 C) 98.5 F (36.9 C)  TempSrc: Oral Oral Oral Oral  SpO2: 97% 98% 98% 98%  Weight:      Height:        Temp:  [98.1 F (36.7 C)-98.6 F (37 C)] 98.5 F (36.9 C) (03/11 1216) Pulse Rate:  [63-89] 68 (03/11 1216) Resp:  [16-20] 18 (03/11 1216) BP: (111-135)/(60-79) 118/72 (03/11 1216) SpO2:  [96 %-100 %] 98 % (03/11 1216) Weight:  [57.4 kg (126 lb 8.7 oz)] 57.4 kg (126 lb 8.7 oz) (03/10 1433)  General - Well nourished, well developed, in no apparent distress.  Ophthalmologic - Fundi not visualized due to  noncooperation.  Cardiovascular - Regular rate and rhythm.  Neuro - awake, alert, follows simple commands, severe expressive aphasia with mumbling sound.  Not able to name or repeat.  PERRL, EOMI, no gaze preference.  Blinking to visual threat bilaterally.  Right facial droop, tongue midline.  Right upper extremity 3/5, right lower extremity 5-/5, left upper extremity and lower extremity 5/5.  DTR 1+, no Babinski.  Sensation symmetrical, coordination not cooperative, gait not tested.  ASSESSMENT/PLAN William Patel is a 84 y.o. male with history of hypertension and colon cancer, presenting with left gaze preference, right sided weakness, and severe aphasia.  The patient received IV TPA on Friday 08/06/2017 at 1745.  Stroke - left MCA infarct embolic likely due to left ICA soft plaque with intimal hematoma    Resultant  Expressive aphasia, right facial droop and right UE weakness  CT head - Disproportionately dense LEFT M2 versus M3 MCA  MRI head - small left perirolandic infarct, punctate left MCA infarcts  CTA H&N - LEFT ICA intimal hematoma approximately 2 cm from the origin with mobile low-density plaque/hematoma in central lumen, at risk for embolization.  2D Echo - EF 65-70%  LDL - 136  HgbA1c - 4.9  VTE prophylaxis - heparin drip Fall precautions Bleeding precautions DIET - DYS 1 Room service appropriate? Yes; Fluid consistency: Honey Thick  No antithrombotic prior to admission, now on heparin drip. Will initiate coumadin with lovenox bridge.    Patient counseled to be compliant with his antithrombotic medications  Ongoing aggressive stroke risk factor management  Therapy recommendations:  HH  Disposition:  HOME HH - awaiting OT and SLP recs - possibly d/c in AM  Left ICA intimal hematoma with mobile density plaque/hematoma in central lumen  Risk for embolization  S/p tPA  Now on heparin drip after tPA   Pharmacy to bridge Lovenox to Coumadin starting  today  Hypertension  Blood pressure tends to run low  Permissive hypertension (OK if < 180/105) but gradually normalize in 5-7 days  Long-term BP goal normotensive  Hyperlipidemia  Home meds: No lipid lowering medications prior to admission  LDL 136, goal < 70  On lipitor 40  Continue statin at discharge  Tobacco abuse  Current smoker  Smoking cessation counseling provided  Pt is willing to quit  Other Stroke Risk Factors  Advanced age  CHF - diastolic   ETOH use, advised to drink no more than 1 drink per day.  Other Active Problems  CKD III - Cre 1.67->1.28>1.17. Repeat lab in AM. Gentle IVF's in progress  Colon Ca s/p surgery  Dysphagia  - SLP following.   Hospital day # 3  Renie Ora Stroke Neurology Team 08/09/2017 12:58 PM  ATTENDING NOTE: I reviewed above note and agree with the assessment and plan. I have made any additions or  clarifications directly to the above note. Pt was seen and examined.   Patient sitting in chair for lunch, still has right facial droop, right arm weakness and expressive aphasia.  Tolerating heparin drip well, no side effect.  Will transition to Coumadin with Lovenox bridge.  Recommend Coumadin for 2-3 months with repeat CTA neck, if thrombus resolved at the time, changed to antiplatelet therapy.  PT/OT recommend outpatient PT/OT.  Speech recommend pured diet with honey thick liquid. Cardiology working with PT for potential Stroke AF trial.   Rosalin Hawking, MD PhD Stroke Neurology 08/09/2017 5:29 PM     To contact Stroke Continuity provider, please refer to [email or web address] After hours, contact General Neurology

## 2017-08-09 NOTE — Research (Signed)
STROKE-AF Research study reviewed with patients daughter over the phone. She will be visiting him this evening and I have left the ICF for her to review. Questions encouraged and answered. I will return in am for follow up.

## 2017-08-09 NOTE — Progress Notes (Signed)
[place] for Heparin Indication: stroke  Allergies  Allergen Reactions  . Tramadol Other (See Comments)    Causes headaches    Patient Measurements: Height: 5\' 2"  (157.5 cm) Weight: 126 lb 8.7 oz (57.4 kg) IBW/kg (Calculated) : 54.6  Vital Signs: Temp: 98.4 F (36.9 C) (03/11 0431) Temp Source: Oral (03/11 0431) BP: 115/60 (03/11 0431) Pulse Rate: 65 (03/11 0431)  Labs: Recent Labs    08/06/17 1736 08/06/17 1742 08/07/17 0857 08/07/17 1438 08/07/17 2033 08/08/17 0411 08/08/17 1347 08/09/17 0621  HGB 16.3 16.0  --   --   --  14.6  --  13.7  HCT 48.0 46.2  --   --   --  41.7  --  40.2  PLT  --  147*  --   --   --  135*  --  125*  APTT  --  29  --   --   --   --   --   --   LABPROT  --  13.2  --   --   --   --   --   --   INR  --  1.01  --   --   --   --   --   --   HEPARINUNFRC  --   --   --   --   --  0.26* 0.29* 0.54  CREATININE 1.50* 1.67*  --   --   --  1.28*  --   --   TROPONINI  --   --  <0.03 <0.03 <0.03  --   --   --     Estimated Creatinine Clearance: 35 mL/min (A) (by C-G formula based on SCr of 1.28 mg/dL (H)).   Medical History: Past Medical History:  Diagnosis Date  . Colon cancer ([place])    Left   . Generalized headaches   . Hematuria   . Hypertension 03/27/2016  . Leg swelling occasional   both legs  . Rectal bleeding    small amount, none current  . Vertigo   . Visual disturbances     Assessment: 84 year old male s/p TPA 3/8 on IV heparin for CVA. Heparin level is supra-therapeutic at 0.54 on 750 units/hr.  CBC is stable. No bleeding reported.   Goal of Therapy:  Heparin level 0.3-0.5 units/ml Monitor platelets by anticoagulation protocol: Yes   Plan:  Decrease Heparin to 700 units / hr Daily heparin level, CBC  Sloan Leiter, PharmD, BCPS, BCCCP Clinical Pharmacist Clinical phone 08/09/2017 until 3:30PM- #[phone] After hours, please call [phone] 08/09/2017,7:18 AM

## 2017-08-10 LAB — CBC
HEMATOCRIT: 40.5 % (ref 39.0–52.0)
HEMOGLOBIN: 13.9 g/dL (ref 13.0–17.0)
MCH: 32.2 pg (ref 26.0–34.0)
MCHC: 34.3 g/dL (ref 30.0–36.0)
MCV: 93.8 fL (ref 78.0–100.0)
Platelets: 127 10*3/uL — ABNORMAL LOW (ref 150–400)
RBC: 4.32 MIL/uL (ref 4.22–5.81)
RDW: 12.5 % (ref 11.5–15.5)
WBC: 6.4 10*3/uL (ref 4.0–10.5)

## 2017-08-10 LAB — BASIC METABOLIC PANEL
Anion gap: 8 (ref 5–15)
BUN: 16 mg/dL (ref 6–20)
CHLORIDE: 107 mmol/L (ref 101–111)
CO2: 24 mmol/L (ref 22–32)
CREATININE: 1.28 mg/dL — AB (ref 0.61–1.24)
Calcium: 8.8 mg/dL — ABNORMAL LOW (ref 8.9–10.3)
GFR calc Af Amer: 59 mL/min — ABNORMAL LOW (ref >60)
GFR calc non Af Amer: 51 mL/min — ABNORMAL LOW (ref >60)
Glucose, Bld: 99 mg/dL (ref 65–99)
Potassium: 3.9 mmol/L (ref 3.5–5.1)
Sodium: 139 mmol/L (ref 135–145)

## 2017-08-10 LAB — PROTIME-INR
INR: 1.03
Prothrombin Time: 13.4 seconds (ref 11.4–15.2)

## 2017-08-10 MED ORDER — SERTRALINE HCL 25 MG PO TABS: 25.0000 mg | ORAL_TABLET | Freq: Every day | ORAL | 1 refills | Status: DC

## 2017-08-10 MED ORDER — RESOURCE THICKENUP CLEAR PO POWD: 1.0000 | ORAL | 1 refills | Status: DC | PRN

## 2017-08-10 MED ORDER — ATORVASTATIN CALCIUM 40 MG PO TABS: 40.0000 mg | ORAL_TABLET | Freq: Every day | ORAL | 1 refills | Status: DC

## 2017-08-10 MED ORDER — WARFARIN SODIUM 5 MG PO TABS: 5.0000 mg | ORAL_TABLET | Freq: Every day | ORAL | 11 refills | Status: DC

## 2017-08-10 MED ORDER — LIDOCAINE 5 % EX PTCH: 1.0000 | MEDICATED_PATCH | CUTANEOUS | 0 refills | Status: DC

## 2017-08-10 MED ORDER — ENOXAPARIN SODIUM 60 MG/0.6ML ~~LOC~~ SOLN: 60.0000 mg | Freq: Two times a day (BID) | SUBCUTANEOUS | 0 refills | Status: DC

## 2017-08-10 MED ORDER — WARFARIN SODIUM 5 MG PO TABS: 5.0000 mg | ORAL_TABLET | Freq: Once | ORAL | Status: DC

## 2017-08-10 NOTE — Progress Notes (Signed)
Occupational Therapy Treatment Patient Details Name: William Patel MRN: [id number] DOB: 1933-06-12 Today's Date: 08/10/2017    History of present illness Patient is a 84 y/o male who presents with right weakness, dysphagia and dysarthria. NIH:11. Head CT- left M2 vs M3 MCA thromboembolism. MRI head- left perivolandic and punctate MCA infarcts. CTA- left frontopariental infarct. s/p tPA. PMH includes vertigo, HTN, colon ca.    OT comments  Pt making progress with functional goals and will have adequate and ample family support upon d/c. OT will continue to follow acutely  Follow Up Recommendations  Outpatient OT;Supervision - Intermittent    Equipment Recommendations  Tub/shower seat    Recommendations for Other Services      Precautions / Restrictions Precautions Precautions: Other (comment) Precaution Comments: fall risk Restrictions Weight Bearing Restrictions: No       Mobility Bed Mobility Overal bed mobility: Needs Assistance Bed Mobility: Supine to Sit     Supine to sit: Modified independent (Device/Increase time)     General bed mobility comments: pt up in recliner upon arrival  Transfers Overall transfer level: Needs assistance Equipment used: None Transfers: Sit to/from Stand Sit to Stand: Supervision         General transfer comment: sup for safety    Balance Overall balance assessment: Needs assistance Sitting-balance support: Feet supported;No upper extremity supported Sitting balance-Leahy Scale: Good     Standing balance support: During functional activity Standing balance-Leahy Scale: Fair Standing balance comment: Able to stand and urinate in bathroom without difficulty.              High level balance activites: Backward walking;Direction changes;Turns;Sudden stops;Head turns High Level Balance Comments: Tolerated above with only minor deviations in gait, no overt LOB. Standardized Balance Assessment Standardized Balance Assessment  : Dynamic Gait Index   Dynamic Gait Index Level Surface: Normal Change in Gait Speed: Normal Gait with Horizontal Head Turns: Normal Gait with Vertical Head Turns: Normal Gait and Pivot Turn: Mild Impairment Step Over Obstacle: Mild Impairment Step Around Obstacles: Normal Steps: Mild Impairment Total Score: 21     ADL either performed or assessed with clinical judgement   ADL Overall ADL's : Needs assistance/impaired     Grooming: Wash/dry hands;Wash/dry face;Standing;Min guard   Upper Body Bathing: Minimal assistance Upper Body Bathing Details (indicate cue type and reason): simulated     Upper Body Dressing : Minimal assistance Upper Body Dressing Details (indicate cue type and reason): educated on compensatory hemi techniques, no family present for education     Toilet Transfer: Supervision/safety;Ambulation;Regular Toilet;Grab bars   Toileting- Clothing Manipulation and Hygiene: Minimal assistance;Sit to/from stand   Tub/ Shower Transfer: Supervision/safety;Ambulation;3 in 1     General ADL Comments: educated on compensatory dressing(hemi) techiques     Vision Patient Visual Report: Peripheral vision impairment(R periphery impaired)     Perception     Praxis      Cognition Arousal/Alertness: Awake/alert Behavior During Therapy: WFL for tasks assessed/performed Overall Cognitive Status: Difficult to assess                                 General Comments: Seems WFL for basic tasks. Some right visual field deficits possible?        Exercises Other Exercises Other Exercises: Pt participated in compensatory strategies of vision/awareness during ADLs and ADL mobility with min A and min verbal cues Other Exercises: educated on self ROM for R UE assiting  with his L UE, able to return demo with sup   Shoulder Instructions       General Comments      Pertinent Vitals/ Pain       Pain Assessment: No/denies pain Pain Score: 0-No pain Faces  Pain Scale: No hurt  Home Living                                          Prior Functioning/Environment              Frequency  Min 3X/week        Progress Toward Goals  OT Goals(current goals can now be found in the care plan section)  Progress towards OT goals: Progressing toward goals     Plan Discharge plan remains appropriate    Co-evaluation                 AM-PAC PT "6 Clicks" Daily Activity     Outcome Measure   Help from another person eating meals?: None Help from another person taking care of personal grooming?: A Little Help from another person toileting, which includes using toliet, bedpan, or urinal?: A Little Help from another person bathing (including washing, rinsing, drying)?: A Little Help from another person to put on and taking off regular upper body clothing?: A Little Help from another person to put on and taking off regular lower body clothing?: A Little 6 Click Score: 19    End of Session    Hemiplegia - Right/Left: Right Hemiplegia - dominant/non-dominant: Dominant   Activity Tolerance Patient tolerated treatment well   Patient Left with call bell/phone within reach;in chair;with chair alarm set   Nurse Communication      Functional Assessment Tool Used: AM-PAC 6 Clicks Daily Activity   Time: 8341-9622 OT Time Calculation (min): 25 min  Charges: OT G-codes **NOT FOR INPATIENT CLASS** Functional Assessment Tool Used: AM-PAC 6 Clicks Daily Activity OT Treatments $Self Care/Home Management : 8-22 mins $Therapeutic Activity: 8-22 mins     Britt Bottom 08/10/2017, 12:38 PM

## 2017-08-10 NOTE — Care Management (Signed)
Benefits check done for Lovenox revealed::  S/W LOUISE @ [facility]  # [phone] OPT- 2    1. LOVENOX  60 MG BID SYRINGES  COVER- NONE FORMULARY  PRIOR APPROVAL- YES # [phone]    2. ENOXAPARIN  60 MG BID SYRINGES  COVER- YES  CO-PAY- $ 90.00  TIER- 4 DRUG  PRIOR APPROVAL- NO   PREFERRED PHARMACY : [facility]

## 2017-08-10 NOTE — Progress Notes (Signed)
Nurse went over discharge with patient and family . Patient and family verbalized understanding. Nurse educated patient about given Lovenox shot and coumadin medication. I also explained the importance of keep follow up appointments.All question and concerns addressed. Pt. Discharging home with all belongings. Taken down in a wheelchair.

## 2017-08-10 NOTE — Care Management Note (Signed)
Case Management Note  Patient Details  Name: William Patel MRN: [id number] Date of Birth: 1934-05-23  Subjective/Objective:                    Action/Plan: Pt discharging home with [facility] services. CM met with the patient and his daughter and provided choice. They selected [facility].  CM consulted for coumadin appt for medication management. CM was able to obtain him an appointment at Dr Arelia Sneddon office. Information on the AVS. Also went over this with the daughter. CM faxed the information to Dr Tretha Sciara office that they requested.  Went over the cost of the Lovenox with the daughter and she felt the cost was ok.  Pt with recommendation for a tub bench. Insurance will not cover this and they will purchase one at a different venue.  Daughter and her spouse to provide transportation home.    Expected Discharge Date:  08/10/17               Expected Discharge Plan:  OP Rehab  In-House Referral:     Discharge planning Services  CM Consult  Post Acute Care Choice:    Choice offered to:  Patient, Adult Children  DME Arranged:    DME Agency:     HH Arranged:  RN, OT, PT, Speech Therapy HH Agency:  [facility]  Status of Service:  Completed, signed off  If discussed at [place] of Stay Meetings, dates discussed:    Additional Comments:  Pollie Friar, RN 08/10/2017, 3:03 PM

## 2017-08-10 NOTE — Care Management (Signed)
Start of care for [facility] services can be done per PT and have RN follow up in few days. CM notified Stanton Kidney, NP for neurology and she is in agreement.

## 2017-08-10 NOTE — Discharge Instructions (Signed)

## 2017-08-10 NOTE — Progress Notes (Signed)
  Speech Language Pathology Treatment: Dysphagia;Cognitive-Linquistic  Patient Details Name: William Patel MRN: [id number] DOB: Sep 21, 1933 Today's Date: 08/10/2017 Time: 1130-1150 SLP Time Calculation (min) (ACUTE ONLY): 20 min  Assessment / Plan / Recommendation Clinical Impression  Pt was seen for skilled ST targeting goals for dysphagia and communication. Pt utilized his Visual merchandiser with a combination of pictures and letters to indicate that he was "happy" to be going "home."  Min assist verbal cues needed to initiate use of communication system when verbal expression was unsuccessful.  Discussed using notebook with ST follow up once discharge.  Pt also consumed dys 1 textures and honey thick liquids with mod I use of rate and portion control to minimize anterior loss of boluses from the oral cavity.  No overt s/s of aspiration were evident with purees or thickened liquids.  Pt was left in recliner with call bell within reach.  Continue per current plan of care.    HPI HPI: William Patel an 84 y.o.malewith medical history significant forcolon cancer status post resection, hypertension, CKD IIIand chronic diastolic CHF who presented to theED3[facility]8/19 with acute onset right sided weakness and aphasia. Immediate CT headshowed disproportionately dense LEFT M2 versus M3 MCA, possible thromboembolism in a background of probable hemoconcentration.Patient given immediate TPAAt 1745pm.CTA of the head showedpaucity of distal LEFT MCA vessels associated with acute small LEFT frontoparietal/MCA territory nonhemorrhagic infarct. Stroke swallow screen was deferred as pt was not managing secretions.      SLP Plan  Continue with current plan of care       Recommendations  Diet recommendations: Dysphagia 1 (puree);Honey-thick liquid Liquids provided via: Cup Medication Administration: Crushed with puree Supervision: Patient able to self feed;Staff to assist with self  feeding;Intermittent supervision to cue for compensatory strategies Compensations: Slow rate;Small sips/bites;Lingual sweep for clearance of pocketing;Monitor for anterior loss;Multiple dry swallows after each bite/sip;Clear throat intermittently;Minimize environmental distractions Postural Changes and/or Swallow Maneuvers: Out of bed for meals;Seated upright 90 degrees;Upright 30-60 min after meal                Oral Care Recommendations: Oral care BID Follow up Recommendations: Outpatient SLP SLP Visit Diagnosis: Dysphagia, oropharyngeal phase (R13.12);Cognitive communication deficit (R41.841) Plan: Continue with current plan of care       GO                Hetty Linhart, Selinda Orion 08/10/2017, 11:55 AM

## 2017-08-10 NOTE — Research (Signed)
STROKE-AF Research study follow up. Patients daughter left me a note that the patient nor the family wanted to participate in the study and thanked me for the opportunity.

## 2017-08-10 NOTE — Progress Notes (Signed)
Physical Therapy Treatment Patient Details Name: William Patel MRN: [id number] DOB: [birth year] Today's Date: 08/10/2017    History of Present Illness Patient is a 84 y/o male who presents with right weakness, dysphagia and dysarthria. NIH:11. Head CT- left M2 vs M3 MCA thromboembolism. MRI head- left perivolandic and punctate MCA infarcts. CTA- left frontopariental infarct. s/p tPA. PMH includes vertigo, HTN, colon ca.     PT Comments    Patient progressing well towards PT goals. Continues to have expressive speech difficulties making communication hard. Scored 21/24 on DGI indicating pt is not at increased risk for falls. Tolerated higher level balance challenges with only mild deviations in gait but no overt LOB. Able to walk backwards, perform head turns and changes in direction. Requires close Min guard for stair negotiation secondary to impulsiveness and imbalance. Continue to recommend neuro OPPT to maximize independence and mobility. Will follow.   Follow Up Recommendations  Outpatient PT(neuro)     Equipment Recommendations  None recommended by PT    Recommendations for Other Services       Precautions / Restrictions Precautions Precautions: None Restrictions Weight Bearing Restrictions: No    Mobility  Bed Mobility Overal bed mobility: Needs Assistance Bed Mobility: Supine to Sit     Supine to sit: Modified independent (Device/Increase time)     General bed mobility comments: No assist needed. Mild dizziness which resolved.   Transfers Overall transfer level: Needs assistance Equipment used: None Transfers: Sit to/from Stand Sit to Stand: Supervision         General transfer comment: Supervision for safety. Stood from [employer], transferred to chair post ambulation.  Ambulation/Gait Ambulation/Gait assistance: Supervision Ambulation Distance (Feet): 300 Feet Assistive device: None Gait Pattern/deviations: Step-through pattern;Decreased stride  length;Drifts right/left Gait velocity: decreased   General Gait Details: Slow, mostly steady gait with mild drifting noted but no overt LOB. Tolerated higher level balance activites. See balance section.    Stairs Stairs: Yes   Stair Management: Alternating pattern;One rail Left Number of Stairs: 3(+ 2 steps x2 bouts) General stair comments: Pt not safe negotiating stairs requiring close Min guard due to impulsiveness and imbalance.   Wheelchair Mobility    Modified Rankin (Stroke Patients Only) Modified Rankin (Stroke Patients Only) Pre-Morbid Rankin Score: No symptoms Modified Rankin: Moderate disability     Balance Overall balance assessment: Needs assistance Sitting-balance support: Feet supported;No upper extremity supported Sitting balance-Leahy Scale: Good     Standing balance support: During functional activity Standing balance-Leahy Scale: Fair Standing balance comment: Able to stand and urinate in bathroom without difficulty.              High level balance activites: Backward walking;Direction changes;Turns;Sudden stops;Head turns High Level Balance Comments: Tolerated above with only minor deviations in gait, no overt LOB. Standardized Balance Assessment Standardized Balance Assessment : Dynamic Gait Index   Dynamic Gait Index Level Surface: Normal Change in Gait Speed: Normal Gait with Horizontal Head Turns: Normal Gait with Vertical Head Turns: Normal Gait and Pivot Turn: Mild Impairment Step Over Obstacle: Mild Impairment Step Around Obstacles: Normal Steps: Mild Impairment Total Score: 21      Cognition Arousal/Alertness: Awake/alert Behavior During Therapy: WFL for tasks assessed/performed Overall Cognitive Status: Difficult to assess                                 General Comments: Seems WFL for basic tasks. Some right visual field deficits  possible?      Exercises      General Comments        Pertinent  Vitals/Pain Pain Assessment: Faces Faces Pain Scale: No hurt    Home Living                      Prior Function            PT Goals (current goals can now be found in the care plan section) Progress towards PT goals: Progressing toward goals    Frequency    Min 4X/week      PT Plan Current plan remains appropriate    Co-evaluation              AM-PAC PT "6 Clicks" Daily Activity  Outcome Measure  Difficulty turning over in bed (including adjusting bedclothes, sheets and blankets)?: None Difficulty moving from lying on back to sitting on the side of the bed? : None Difficulty sitting down on and standing up from a chair with arms (e.g., wheelchair, bedside commode, etc,.)?: None Help needed moving to and from a bed to chair (including a wheelchair)?: None Help needed walking in hospital room?: None Help needed climbing 3-5 steps with a railing? : A Little 6 Click Score: 23    End of Session Equipment Utilized During Treatment: Gait belt Activity Tolerance: Patient tolerated treatment well Patient left: in chair;with call bell/phone within reach;with chair alarm set Nurse Communication: Mobility status PT Visit Diagnosis: Hemiplegia and hemiparesis;Muscle weakness (generalized) (M62.81) Hemiplegia - Right/Left: Right Hemiplegia - dominant/non-dominant: Dominant Hemiplegia - caused by: Cerebral infarction     Time: 1025-8527 PT Time Calculation (min) (ACUTE ONLY): 21 min  Charges:  $Neuromuscular Re-education: 8-22 mins                    G Codes:       Wray Kearns, PT, DPT [phone]     Marguarite Arbour A Connell Bognar 08/10/2017, 11:14 AM

## 2017-08-10 NOTE — Discharge Summary (Addendum)
Stroke Discharge Summary  Patient ID: William Patel   MRN: [id number]      DOB: 26-Jun-1933  Date of Admission: 08/06/2017 Date of Discharge: 08/10/2017  Attending Physician:  William Hawking, MD, Stroke MD Consultant(s):    None  Patient's PCP:  William Downing, MD  DISCHARGE DIAGNOSIS:  Stroke - left MCA infarct embolic likely due to left ICA soft plaque with intimal hematoma    Active Problems:   Carotid stenosis, left   Smoker   HLD (hyperlipidemia) Hypertension Dysphagia Aphasia CHF CKD  Past Medical History:  Diagnosis Date  . Colon cancer ([place])    Left   . Generalized headaches   . Hematuria   . Hypertension 03/27/2016  . Leg swelling occasional   both legs  . Rectal bleeding    small amount, none current  . Vertigo   . Visual disturbances    Past Surgical History:  Procedure Laterality Date  . COLON RESECTION  09/21/2011   Procedure: COLON RESECTION LAPAROSCOPIC;  Surgeon: William Earls, MD;  Location: [facility];  Service: General;  Laterality: N/A;     . COLONOSCOPY  feb 2013   endo done also    Allergies as of 08/10/2017      Reactions   Tramadol Other (See Comments)   Causes headaches      Medication List    TAKE these medications   atorvastatin 40 MG tablet Commonly known as:  LIPITOR Take 1 tablet (40 mg total) by mouth daily at 6 PM.   enoxaparin 60 MG/0.6ML injection Commonly known as:  LOVENOX Inject 0.6 mLs (60 mg total) into the skin every 12 (twelve) hours.   lidocaine 5 % Commonly known as:  LIDODERM Place 1 patch onto the skin daily. Remove & Discard patch within 12 hours or as directed by MD Start taking on:  08/11/2017 Notes to patient:  Continue home schedule   RESOURCE THICKENUP CLEAR Powd Take 120 g by mouth as needed. Notes to patient:  Continue home schedule    sertraline 25 MG tablet Commonly known as:  ZOLOFT Take 1 tablet (25 mg total) by mouth daily.   warfarin 5 MG tablet Commonly known as:   COUMADIN Take 1 tablet (5 mg total) by mouth daily.      LABORATORY STUDIES CBC    Component Value Date/Time   WBC 6.4 08/10/2017 0359   RBC 4.32 08/10/2017 0359   HGB 13.9 08/10/2017 0359   HCT 40.5 08/10/2017 0359   HCT 45.2 08/06/2017 2008   PLT 127 (L) 08/10/2017 0359   MCV 93.8 08/10/2017 0359   MCH 32.2 08/10/2017 0359   MCHC 34.3 08/10/2017 0359   RDW 12.5 08/10/2017 0359   LYMPHSABS 1.3 08/06/2017 1742   MONOABS 0.5 08/06/2017 1742   EOSABS 0.1 08/06/2017 1742   BASOSABS 0.0 08/06/2017 1742   CMP    Component Value Date/Time   NA 139 08/10/2017 0359   K 3.9 08/10/2017 0359   CL 107 08/10/2017 0359   CO2 24 08/10/2017 0359   GLUCOSE 99 08/10/2017 0359   BUN 16 08/10/2017 0359   CREATININE 1.28 (H) 08/10/2017 0359   CALCIUM 8.8 (L) 08/10/2017 0359   PROT 6.2 (L) 08/06/2017 1742   ALBUMIN 3.8 08/06/2017 1742   AST 24 08/06/2017 1742   ALT 13 (L) 08/06/2017 1742   ALKPHOS 58 08/06/2017 1742   BILITOT 1.0 08/06/2017 1742   GFRNONAA 51 (L) 08/10/2017 0359   GFRAA 59 (  L) 08/10/2017 0359   COAGS Lab Results  Component Value Date   INR 1.03 08/10/2017   INR 1.01 08/06/2017   INR 1.02 06/02/2010   Lipid Panel    Component Value Date/Time   CHOL 208 (H) 08/07/2017 0259   TRIG 91 08/07/2017 0259   HDL 54 08/07/2017 0259   CHOLHDL 3.9 08/07/2017 0259   VLDL 18 08/07/2017 0259   LDLCALC 136 (H) 08/07/2017 0259   HgbA1C  Lab Results  Component Value Date   HGBA1C 4.9 08/07/2017   Urinalysis    Component Value Date/Time   COLORURINE YELLOW 03/27/2016 1754   APPEARANCEUR CLEAR 03/27/2016 1754   LABSPEC 1.018 03/27/2016 1754   PHURINE 7.0 03/27/2016 1754   GLUCOSEU NEGATIVE 03/27/2016 1754   HGBUR NEGATIVE 03/27/2016 1754   BILIRUBINUR NEGATIVE 03/27/2016 1754   KETONESUR NEGATIVE 03/27/2016 1754   PROTEINUR NEGATIVE 03/27/2016 1754   UROBILINOGEN 0.2 12/24/2013 1232   NITRITE NEGATIVE 03/27/2016 1754   LEUKOCYTESUR NEGATIVE 03/27/2016 1754    Urine Drug Screen No results found for: LABOPIA, COCAINSCRNUR, LABBENZ, AMPHETMU, THCU, LABBARB  Alcohol Level    Component Value Date/Time   [facility]  06/01/2010 1508    <5        LOWEST DETECTABLE LIMIT FOR SERUM ALCOHOL IS 5 mg/dL FOR MEDICAL PURPOSES ONLY   SIGNIFICANT DIAGNOSTIC STUDIES I have personally reviewed the radiological images below and agree with the radiology interpretations.  Ct Angio Head W Or Wo Contrast Ct Angio Neck W Or Wo Contrast 08/06/2017  CTA NECK:  1. LEFT internal carotid artery 1 cm intimal hematoma approximately 2 cm from the origin with mobile low-density plaque/hematoma in central lumen, at risk for embolization. Approximately 55% stenosis LEFT ICA.  2. Severe bilateral C4-5 neural foraminal narrowing.  CTA HEAD:  1. No emergent large vessel occlusion. However, paucity of distal LEFT MCA vessels associated with acute small LEFT frontoparietal/MCA territory nonhemorrhagic infarct.   Ct Head Code Stroke Wo Contrast 08/06/2017 IMPRESSION:  1. Disproportionately dense LEFT M2 versus M3 MCA, possible thromboembolism in a background of probable hemoconcentration.  2. ASPECTS is 10.   MRI 1. Moderate acute left perirolandic infarct. Punctate recent infarcts in the left caudate head and left occipital parietal cortex. These could all be explained by the patient's left cervical ICA thrombus. 2. Partial and motion degraded study.  TTE - Left ventricle: The cavity size was normal. Wall thickness was normal. Systolic function was vigorous. The estimated ejection fraction was in the range of 65% to 70%. Wall motion was normal; there were no regional wall motion abnormalities. Doppler parameters are consistent with abnormal left ventricular relaxation (grade 1 diastolic dysfunction). - Aortic valve: There was trivial regurgitation    HISTORY OF William Patel COURSE HPI: William Patel is an 84 y.o. male with medical history  significant forcolon cancer status post resection, hypertension, CKD III and chronic diastolic CHF who presents to the ED with acute onset right sided weakness and aphasia.  Patient was in his usual state of health, at baseline ambulatory without assistance.  He was at the [facility] with his family got into the car to drive home and in the parking lot started having acute right-sided weakness, inability to move his right side with this aphasia.  EMS was immediately called, and on arrival patient states right-sided symptoms continue to persist with a right facial droop blood pressure 150/90.  On admission to the ER, patient was aphasic attempting  to mumble, with right facial droop and some drooling.  Right arm no effort against gravity but able to move bilateral lower extremities equally with mild right lower extremity weakness.  Patient appears to have visual fields intact and initial NIH score was 11  Immediate CT head showed disproportionately dense LEFT M2 versus M3 MCA, possible thromboembolism in a background of probable hemoconcentration.  Family was called by Dr. Leonel Ramsay who was able to speak to patient's son, received implied consent and patient given immediate TPA  At 1745pm.  CTA of the head showed paucity of distal LEFT MCA vessels associated with acute small LEFT frontoparietal/MCA territory nonhemorrhagic infarct.  CTA Neck: LEFT internal carotid artery 1 cm intimal hematoma at risk for embolization as well as severe bilateral C4-5 neural foraminal narrowing.  Patient will be admitted to ICU post TPA for more critical neuro monitoring  ASSESSMENT/PLAN Mr. ARTIN MCEUEN is a 84 y.o. male with history of hypertension and colon cancer, presenting with left gaze preference, right sided weakness, and severe aphasia. The patient received IV TPA on Friday 08/06/2017 at 1745.  Stroke - left MCA infarct embolic likely due to left ICA soft plaque with intimal hematoma    Resultant   Expressive aphasia, right facial droop and right UE weakness  CT head - Disproportionately dense LEFT M2 versus M3 MCA  MRI head - small left perirolandic infarct, punctate left MCA infarcts  CTA H&N - LEFT ICA intimal hematoma approximately 2 cm from the origin with mobile low-density plaque/hematoma in central lumen, at risk for embolization.  2D Echo - EF 65-70%  LDL - 136  HgbA1c - 4.9  VTE prophylaxis - heparin drip  Fall precautions  Bleeding precautions  DIET - DYS 1 Room service appropriate? Yes; Fluid consistency: Honey Thick  No antithrombotic prior to admission, now on heparin drip. Will initiate coumadin with lovenox bridge.    Patient counseled to be compliant with his antithrombotic medications  Ongoing aggressive stroke risk factor management  Therapy recommendations:  HH  Disposition:  HOME HH PT/OT/SLP  Left ICA intimal hematoma with mobile density plaque/hematoma in central lumen  Risk for embolization  S/p tPA  heparin drip discontinued  Pharmacy to bridge Lovenox to Coumadin, Patient will be discharged home with a one week Lovenox bridge supply and Coumadin 5 mg daily, he will follow up with PCP tomorrow for INR check.   Hypertension  Blood pressure tends to run low              Permissive hypertension (OK if < 180/105) but gradually normalize in 5-7 days              Long-term BP goal normotensive  Hyperlipidemia  Home meds: No lipid lowering medications prior to admission  LDL 136, goal < 70  Put on lipitor 40  Continue statin at discharge  Tobacco abuse  Current smoker  Smoking cessation counseling provided  Pt is willing to quit  Other Stroke Risk Factors  Advanced age  CHF - diastolic   ETOH use, advised to drink no more than 1 drink per day.  Other Active Problems  CKD III - Cre 1.67->1.28>1.34>1.28  Colon Ca s/p surgery  Dysphagia  - SLP following.  Hospital day # 4  DISCHARGE EXAM Blood pressure  129/85, pulse 72, temperature 98.4 F (36.9 C), temperature source Oral, resp. rate 20, height 5\' 2"  (1.575 m), weight 57.4 kg (126 lb 8.7 oz), SpO2 100 %. HEENT-  Normocephalic, no lesions, without  obvious abnormality.  Normal external eye and conjunctiva.   Cardiovascular- S1-S2 audible, pulses palpable throughout   Lungs-no rhonchi or wheezing noted, no excessive working breathing.  Saturations within normal limits Abdomen- All 4 quadrants palpated and nontender Musculoskeletal-no joint tenderness, deformity or swelling Skin-warm and dry,   Neurological Examination Mental Status: Alert, awake, moderate to severe expressive aphasia. Able to nod answers and  able to follow 3 step commands without difficulty. Cranial Nerves: NK:NLZJQB fields intact III,IV, VI: ptosis not present, extra-ocular motions intact bilaterally pupils equal, round, reactive to light and accommodation V,VII: smile assymmetric, right facial droop, unable to accurately assess facial light touch sensation VIII: Hearing intact to voice IX,X: uvula rises symmetrically XI: bilateral shoulder shrug XII: midline tongue extension Motor:  Right :  Upper extremity   3/5                                      Left:     Upper extremity   5/5             Lower extremity   45                                                   Lower extremity   5/5 Tone and bulk:normal tone throughout; no atrophy noted Sensory: unable to accurately assess due to aphasia, but pt does nod yes to equal sensation bilaterally Deep Tendon Reflexes: 2+ brisk and symmetric throughout Plantars: Right: downgoing- weaker                              Left: downgoing Cerebellar: Unable to perform finger-to-nose on right due to  right sided weakness Gait: not assessed  Discharge Diet   Fall precautions Bleeding precautions DIET - DYS 1 Room service appropriate? Yes; Fluid consistency: Honey Thick liquids  DISCHARGE PLAN  Disposition:  HOME  warfarin  daily for secondary stroke prevention.  Ongoing risk factor control by Primary Care Physician at time of discharge  Follow-up William Downing, MD in AM for INR Check and management of Coumadin and Lovenox bridge  Follow-up with Dr. Rosalin Patel, Stroke Clinic in 4-6 weeks, office to schedule an appointment.  Greater 30 minutes were spent preparing discharge.  William Hawking, MD PhD Stroke Neurology 08/10/2017 3:58 PM

## 2017-08-11 DIAGNOSIS — I10 Essential (primary) hypertension: Secondary | ICD-10-CM | POA: Diagnosis not present

## 2017-08-11 DIAGNOSIS — I639 Cerebral infarction, unspecified: Secondary | ICD-10-CM | POA: Diagnosis not present

## 2017-08-12 ENCOUNTER — Telehealth: Payer: Self-pay | Admitting: Neurology

## 2017-08-12 DIAGNOSIS — R471 Dysarthria and anarthria: Secondary | ICD-10-CM | POA: Diagnosis not present

## 2017-08-12 DIAGNOSIS — R1312 Dysphagia, oropharyngeal phase: Secondary | ICD-10-CM | POA: Diagnosis not present

## 2017-08-12 DIAGNOSIS — I6529 Occlusion and stenosis of unspecified carotid artery: Secondary | ICD-10-CM | POA: Diagnosis not present

## 2017-08-12 DIAGNOSIS — I639 Cerebral infarction, unspecified: Secondary | ICD-10-CM | POA: Diagnosis not present

## 2017-08-12 DIAGNOSIS — M6281 Muscle weakness (generalized): Secondary | ICD-10-CM | POA: Diagnosis not present

## 2017-08-12 NOTE — Telephone Encounter (Signed)
Roselie Awkward a physical therapist with [facility], stating he saw the pt and is going to do PT once a week for 3 weeks any questions he can be reached at [phone]

## 2017-08-12 NOTE — Telephone Encounter (Signed)
Rn call William Patel with home health. Rn stated per Dr. Erlinda Hong pt can have PT once weekly for three weeks. William Patel verbalized understanding.

## 2017-08-13 DIAGNOSIS — I6529 Occlusion and stenosis of unspecified carotid artery: Secondary | ICD-10-CM | POA: Diagnosis not present

## 2017-08-13 NOTE — Consult Note (Signed)
            [facility] CM Primary Care Navigator  08/13/2017  William Patel 02-01-1934 [id number]   Attempt to see patient at the bedside to identify possible discharge needs but he was already discharged. Patient was discharged home with home health services.  Patient has a discharge instruction to follow-up with primary care provider (Dr. Claris Gower) on 3/13.  Noted withorder already in place for [facility] Stroke calls to follow-up recovery at home.   For additional questions please contact:  Edwena Felty A. Starasia Sinko, BSN, RN-BC [facility] PRIMARY CARE Navigator Cell: [phone]

## 2017-08-16 NOTE — Telephone Encounter (Signed)
Rn call [name] at OT from [facility] home health orders. Rn stated per Dr. Erlinda Hong he can have 2 visits for home exercise for upper extremities.[name] verbalized understanding.

## 2017-08-16 NOTE — Telephone Encounter (Signed)
OT [name] from [facility] has called for orders: 2 visits  For home exercise programs for upper extremities due to stroke.  OT [name] can be reached at [phone]

## 2017-08-18 DIAGNOSIS — I130 Hypertensive heart and chronic kidney disease with heart failure and stage 1 through stage 4 chronic kidney disease, or unspecified chronic kidney disease: Secondary | ICD-10-CM | POA: Diagnosis not present

## 2017-08-18 DIAGNOSIS — I69391 Dysphagia following cerebral infarction: Secondary | ICD-10-CM | POA: Diagnosis not present

## 2017-08-18 DIAGNOSIS — I69320 Aphasia following cerebral infarction: Secondary | ICD-10-CM | POA: Diagnosis not present

## 2017-08-18 DIAGNOSIS — R1310 Dysphagia, unspecified: Secondary | ICD-10-CM | POA: Diagnosis not present

## 2017-08-18 DIAGNOSIS — I69351 Hemiplegia and hemiparesis following cerebral infarction affecting right dominant side: Secondary | ICD-10-CM | POA: Diagnosis not present

## 2017-08-23 DIAGNOSIS — I130 Hypertensive heart and chronic kidney disease with heart failure and stage 1 through stage 4 chronic kidney disease, or unspecified chronic kidney disease: Secondary | ICD-10-CM | POA: Diagnosis not present

## 2017-08-23 DIAGNOSIS — I69320 Aphasia following cerebral infarction: Secondary | ICD-10-CM | POA: Diagnosis not present

## 2017-08-23 DIAGNOSIS — I69391 Dysphagia following cerebral infarction: Secondary | ICD-10-CM | POA: Diagnosis not present

## 2017-08-23 DIAGNOSIS — R1310 Dysphagia, unspecified: Secondary | ICD-10-CM | POA: Diagnosis not present

## 2017-08-23 DIAGNOSIS — I69351 Hemiplegia and hemiparesis following cerebral infarction affecting right dominant side: Secondary | ICD-10-CM | POA: Diagnosis not present

## 2017-08-30 DIAGNOSIS — I69391 Dysphagia following cerebral infarction: Secondary | ICD-10-CM | POA: Diagnosis not present

## 2017-08-30 DIAGNOSIS — I69320 Aphasia following cerebral infarction: Secondary | ICD-10-CM | POA: Diagnosis not present

## 2017-08-30 DIAGNOSIS — R1310 Dysphagia, unspecified: Secondary | ICD-10-CM | POA: Diagnosis not present

## 2017-08-30 DIAGNOSIS — I130 Hypertensive heart and chronic kidney disease with heart failure and stage 1 through stage 4 chronic kidney disease, or unspecified chronic kidney disease: Secondary | ICD-10-CM | POA: Diagnosis not present

## 2017-08-30 DIAGNOSIS — I69351 Hemiplegia and hemiparesis following cerebral infarction affecting right dominant side: Secondary | ICD-10-CM | POA: Diagnosis not present

## 2017-09-21 ENCOUNTER — Encounter: Payer: Self-pay | Admitting: Adult Health

## 2017-09-21 ENCOUNTER — Ambulatory Visit (INDEPENDENT_AMBULATORY_CARE_PROVIDER_SITE_OTHER): Payer: PPO | Admitting: Adult Health

## 2017-09-21 VITALS — BP 133/80 | HR 79 | Ht 61.0 in | Wt 124.0 lb

## 2017-09-21 DIAGNOSIS — E785 Hyperlipidemia, unspecified: Secondary | ICD-10-CM

## 2017-09-21 DIAGNOSIS — R29898 Other symptoms and signs involving the musculoskeletal system: Secondary | ICD-10-CM

## 2017-09-21 DIAGNOSIS — I63412 Cerebral infarction due to embolism of left middle cerebral artery: Secondary | ICD-10-CM

## 2017-09-21 DIAGNOSIS — I10 Essential (primary) hypertension: Secondary | ICD-10-CM | POA: Diagnosis not present

## 2017-09-21 NOTE — Patient Instructions (Signed)
Continue warfarin daily  and lipitor  for secondary stroke prevention  Continue to follow with PCP regarding warfarin and INR checks  Schedule CT angio neck in approx 4-6 weeks to check on blood clot. If this has resolved, we will stop warfarin and start you on Plavix. We will call you to schedule appointment and will call you with results.  Continue occupational and speech therapy - new referral placed for OT  Maintain strict control of hypertension with blood pressure goal below 130/90, diabetes with hemoglobin A1c goal below 6.5% and cholesterol with LDL cholesterol (bad cholesterol) goal below 70 mg/dL. I also advised the patient to eat a healthy diet with plenty of whole grains, cereals, fruits and vegetables, exercise regularly and maintain ideal body weight.  Followup in the future with me in 3 months or call earlier if needed

## 2017-09-21 NOTE — Progress Notes (Signed)
[facility] [address] [place]. [place] [address] [phone]       OFFICE FOLLOW UP NOTE  Mr. William Patel Date of Birth:  [birth year] Medical Record Number:  [id number]   Reason for Referral:  hospital stroke follow up  CHIEF COMPLAINT:  Chief Complaint  Patient presents with  . Follow-up    pt is with daughter, still having difficulty with right hand and speech. his right hand looks swollen and he states that he does keep it elevated. PT/ ST are still following in home setting    HPI: William Patel is being seen today for initial visit in the office for left MCA on 08/06/17. History obtained from patient and chart review. Reviewed all radiology images and labs personally.  William Thain Hardenis an [age] y.o.malewith medical history significant forcolon cancer status post resection, hypertension, CKD III and chronic diastolic CHF who presents to the ED with acute onset right sided weakness and aphasia. Patient was in his usual state of health, at baseline ambulatory without assistance. He was at the [facility] with his family got into the car to drive home and in the parking lot started having acute right-sided weakness, inability to move his right side with this aphasia. EMS was immediately called, and on arrival patient states right-sided symptoms continue to persist with a right facial droop blood pressure 150/90. On admission to the ER, patient was aphasic attempting to mumble, with right facial droop and some drooling. Right arm no effort against gravity but able to move bilateral lower extremities equally with mild right lower extremity weakness. Immediate CT head showed disproportionately dense left M2 versus M3 MCA, possible thromboembolism in a background of probable hemoconcentration. Family was called by Dr. Leonel Ramsay who was able to speak to patient's son, received implied consent and patient given immediate TPA At 1745pm. CTA of the head showed  paucity of distal left MCA vessels associated with acute small LEFT frontoparietal/MCA territory nonhemorrhagic infarct. CTA Neck showed left internal carotid artery 1 cm intimal hematoma at risk for embolization as well as severe bilateral C4-5 neural foraminal narrowing.  MRI brain reviewed and showed small left perirolandic infarct and punctate left MCA infarcts.  2D echo showed EF of 65 to 70%.  LDL 136 and A1c satisfactory 4.9.  Patient was not on antithrombotic prior to admission and this patient was on heparin drip during admission patient was discharged on Coumadin.  Recommended to continue Coumadin for to 3 months and repeat CT of neck and if thrombus resolved at that time recommended to change to antiplatelet therapy.  Patient was not on statin prior to admission and recommended to be discharged on Lipitor 40 mg.  PT OT recommended outpatient PT OT and patient discharged home in stable condition.  Since discharge, patient has been improving.  PT/OT have recently been completed but continue speech twice a week.  Right hand flaccid but strong and rest of extremity.  Daughter questioning if he needs further occupational therapy.  Continues to have moderate expressive aphasia and dysarthria.  Right facial droop still present.  Continues to take Coumadin and PCP does INR checks and current regimen 5 mg every other day and 2.5 mg every other day.  INR has been stable at this dose and denies bleeding or bruising.  Continues to take Lipitor without side effects of myalgias.  Right hand is slightly swollen and advised patient that he needs to keep this elevated and continue to move it with opposite hand.  Denies pain, warmth, or redness.  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  Denies new or worsening stroke/TIA symptoms.  ROS:   14 system review of systems performed and negative with exception of numbness weakness slurred speech  PMH:  Past  Medical History:  Diagnosis Date  . Colon cancer ([place])    Left   . Generalized headaches   . Visual disturbances     PSH:  Past Surgical History:  Procedure Laterality Date  . COLON RESECTION  09/21/2011   Procedure: COLON RESECTION LAPAROSCOPIC;  Surgeon: Pedro Earls, MD;  Location: [facility];  Service: General;  Laterality: N/A;     . COLONOSCOPY  feb 2013   endo done also    Social History:  Social History   Socioeconomic History  . Marital status: Widowed    Spouse name: Not on file  . Number of children: Not on file  . Years of education: Not on file  . Highest education level: Not on file  Occupational History  . Not on file  Social Needs  . Financial resource strain: Not on file  . Food insecurity:    Worry: Not on file    Inability: Not on file  . Transportation needs:    Medical: Not on file    Non-medical: Not on file  Tobacco Use  . Smoking status: Light Tobacco Smoker    Types: Cigars  . Smokeless tobacco: Current User    Types: Chew  Substance and Sexual Activity  . Alcohol use: Yes    Alcohol/week: 1.8 oz    Types: 3 Cans of beer per week  . Drug use: No  . Sexual activity: Not on file  Lifestyle  . Physical activity:    Days per week: Not on file    Minutes per session: Not on file  . Stress: Not on file  Relationships  . Social connections:    Talks on phone: Not on file    Gets together: Not on file    Attends religious service: Not on file    Active member of club or organization: Not on file    Attends meetings of clubs or organizations: Not on file    Relationship status: Not on file  . Intimate partner violence:    Fear of current or ex partner: Not on file    Emotionally abused: Not on file    Physically abused: Not on file    Forced sexual activity: Not on file  Other Topics Concern  . Not on file  Social History Narrative  . Not on file    Family History:  Family History  Problem Relation Age of Onset  . Heart failure  Mother   . Brain cancer Father     Medications:   Current Outpatient Medications on File Prior to Visit  Medication Sig Dispense Refill  . atorvastatin (LIPITOR) 40 MG tablet Take 1 tablet (40 mg total) by mouth daily at 6 PM. 30 tablet 1  . sertraline (ZOLOFT) 25 MG tablet Take 1 tablet (25 mg total) by mouth daily. 30 tablet 1  . warfarin (COUMADIN) 5 MG tablet Take 1 tablet (5 mg total) by mouth daily. 30 tablet 11   No current facility-administered medications on file prior to visit.     Allergies:   Allergies  Allergen Reactions  . Tramadol Other (See Comments)    Causes headaches     Physical Exam  Vitals:  09/21/17 1419  BP: 133/80  Pulse: 79  Weight: 124 lb (56.2 kg)  Height: 5\' 1"  (1.549 m)   Body mass index is 23.43 kg/m. No exam data present  General: well developed, pleasant elderly Caucasian male, well nourished, seated, in no evident distress Head: head normocephalic and atraumatic.   Neck: supple with no carotid or supraclavicular bruits Cardiovascular: regular rate and rhythm, no murmurs Musculoskeletal: no deformity Skin:  no rash/petichiae Vascular:  Normal pulses all extremities  Neurologic Exam Mental Status: Awake and fully alert.  Moderate expressive aphasia and moderate dysarthria present oriented to place and time. Recent and remote memory intact. Attention span, concentration and fund of knowledge appropriate. Mood and affect appropriate.  Cranial Nerves: Fundoscopic exam reveals sharp disc margins. Pupils equal, briskly reactive to light. Extraocular movements full without nystagmus. Visual fields full to confrontation. Hearing intact. Facial sensation intact.  Mild right lower facial paralysis Motor: Normal bulk and tone. Normal strength in all tested extremity muscles.  Right hand flaccid Sensory.: intact to touch , pinprick , position and vibratory sensation.  Coordination: Rapid alternating movements normal in left upper extremity.  Finger-to-nose and heel-to-shin performed accurately in left upper extremity and bilateral lower extremities Gait and Station: Arises from chair without difficulty. Stance is normal. Gait demonstrates normal stride length and balance . Able to heel, toe and tandem walk without difficulty.  Reflexes: 1+ and symmetric. Toes downgoing.    NIHSS  3 Modified Rankin  2    Diagnostic Data (Labs, Imaging, Testing)   Ct Angio Head W Or Wo Contrast Ct Angio Neck W Or Wo Contrast 08/06/2017  CTA NECK:  1. LEFT internal carotid artery 1 cm intimal hematoma approximately 2 cm from the origin with mobile low-density plaque/hematoma in central lumen, at risk for embolization. Approximately 55% stenosis LEFT ICA.  2. Severe bilateral C4-5 neural foraminal narrowing.  CTA HEAD:  1. No emergent large vessel occlusion. However, paucity of distal LEFT MCA vessels associated with acute small LEFT frontoparietal/MCA territory nonhemorrhagic infarct.   Ct Head Code Stroke Wo Contrast 08/06/2017 IMPRESSION:  1. Disproportionately dense LEFT M2 versus M3 MCA, possible thromboembolism in a background of probable hemoconcentration.  2. ASPECTS is 10.   MRI 1. Moderate acute left perirolandic infarct. Punctate recent infarcts in the left caudate head and left occipital parietal cortex. These could all be explained by the patient's left cervical ICA thrombus. 2. Partial and motion degraded study.  TTE - Left ventricle: The cavity size was normal. Wall thickness was normal. Systolic function was vigorous. The estimated ejection fraction was in the range of 65% to 70%. Wall motion was normal; there were no regional wall motion abnormalities. Doppler parameters are consistent with abnormal left ventricular relaxation (grade 1 diastolic dysfunction). - Aortic valve: There was trivial regurgitation.    ASSESSMENT: William Patel is a 84 y.o. year old male here with left MCA on 08/06/17 secondary  to left ICA plaque with intimal hematoma. Vascular risk factors include HLD, HTN and CHF.    PLAN: -Continue warfarin daily  and Lipitor for secondary stroke prevention -F/u with PCP regarding your warfarin and INR checks management -continue to monitor BP at home -CT angios to be scheduled for 6 weeks to assess left ICA hematoma if this has resolved, we will stop warfarin and start Plavix.  -Continue occupational and speech therapy - new referral placed for OT -Maintain strict control of hypertension with blood pressure goal below 130/90, diabetes with hemoglobin A1c goal below  6.5% and cholesterol with LDL cholesterol (bad cholesterol) goal below 70 mg/dL. I also advised the patient to eat a healthy diet with plenty of whole grains, cereals, fruits and vegetables, exercise regularly and maintain ideal body weight.  Follow up in 3 months or call earlier if needed  Greater than 50% time during this 25 minute consultation visit was spent on counseling and coordination of care about HLD, and HTN, discussion about risk benefit of anticoagulation and answering questions.   Venancio Poisson, AGNP-BC  [facility] [address] [place], [place] [address]  Phone [phone] Fax [phone]

## 2017-09-22 ENCOUNTER — Telehealth: Payer: Self-pay | Admitting: Adult Health

## 2017-09-22 NOTE — Telephone Encounter (Signed)
Health team auth: [employer] via their website pt is scheduled at [facility] for Fri. 09/24/17 arrival time is 12:30 pm for lab works lvm detal vmail and left their number of [phone] if for some reason this is not a good day or time.

## 2017-09-22 NOTE — Addendum Note (Signed)
Addended by: Venancio Poisson on: 09/22/2017 08:31 AM   Modules accepted: Orders

## 2017-09-24 DIAGNOSIS — R29898 Other symptoms and signs involving the musculoskeletal system: Secondary | ICD-10-CM | POA: Diagnosis not present

## 2017-09-24 DIAGNOSIS — I69951 Hemiplegia and hemiparesis following unspecified cerebrovascular disease affecting right dominant side: Secondary | ICD-10-CM | POA: Diagnosis not present

## 2017-09-24 DIAGNOSIS — I6522 Occlusion and stenosis of left carotid artery: Secondary | ICD-10-CM | POA: Diagnosis not present

## 2017-09-24 NOTE — Progress Notes (Signed)
I agree with the above plan 

## 2017-10-01 DIAGNOSIS — I69951 Hemiplegia and hemiparesis following unspecified cerebrovascular disease affecting right dominant side: Secondary | ICD-10-CM | POA: Diagnosis not present

## 2017-10-01 DIAGNOSIS — I69322 Dysarthria following cerebral infarction: Secondary | ICD-10-CM | POA: Diagnosis not present

## 2017-10-01 DIAGNOSIS — R29898 Other symptoms and signs involving the musculoskeletal system: Secondary | ICD-10-CM | POA: Diagnosis not present

## 2017-10-06 DIAGNOSIS — I6529 Occlusion and stenosis of unspecified carotid artery: Secondary | ICD-10-CM | POA: Diagnosis not present

## 2017-10-08 DIAGNOSIS — I6529 Occlusion and stenosis of unspecified carotid artery: Secondary | ICD-10-CM | POA: Diagnosis not present

## 2017-10-12 ENCOUNTER — Other Ambulatory Visit: Payer: Self-pay

## 2017-10-12 ENCOUNTER — Telehealth: Payer: Self-pay | Admitting: Neurology

## 2017-10-12 DIAGNOSIS — I6522 Occlusion and stenosis of left carotid artery: Secondary | ICD-10-CM

## 2017-10-12 MED ORDER — CLOPIDOGREL BISULFATE 75 MG PO TABS: 75.0000 mg | ORAL_TABLET | Freq: Every day | ORAL | 3 refills | Status: DC

## 2017-10-12 NOTE — Telephone Encounter (Signed)
Rn call patients daughter Butch Penny on dpr.Rn stated per Janett Billow NP that the clot has resolved. RN stated per Janett Billow NP pt can stop the coumadin, and began taking plavix. Rn stated there is no blood test need for plavix for monitoring. Rn stated the pt can stop the coumadin when he gets the plavix rx. Rn stated it was sent to [facility]. The daughter verbalized understanding.

## 2017-10-12 NOTE — Addendum Note (Signed)
Addended by: Venancio Poisson on: 10/12/2017 08:43 AM   Modules accepted: Orders

## 2017-10-12 NOTE — Telephone Encounter (Signed)
CTA neck on 09/24/17 was reviewed. Left proximal ICA mobile clot has resolved. I think if pt is neurologically stable, his coumadin can be discontinued and switch to plavix. Hi, Jessica, please call pt and make the switch. Thanks.   Of course, on repeat CTA neck, left ICA proximal still has a lot of soft plaques which can be risk for further emboli but currently stenosis < 50%. No intervention needed at this time. Pt would be from vascular surgery follow up. I have ordered the referral (see below).  Rosalin Hawking, MD PhD Stroke Neurology 10/12/2017 8:19 AM  Orders Placed This Encounter  Procedures  . Ambulatory referral to Vascular Surgery    Referral Priority:   Routine    Referral Type:   Surgical    Referral Reason:   Specialty Services Required    Requested Specialty:   Vascular Surgery    Number of Visits Requested:   1

## 2017-10-12 NOTE — Telephone Encounter (Signed)
Left vm for patients daughter on dpr to call back about medication change for her father.

## 2017-10-12 NOTE — Telephone Encounter (Signed)
Pt daughter(on DPR) called RN Katrina back.  Pt daughter asking for a call back.  She states if RN calls and she doesn't pick up call right back and she will try to get to a good location to take the calll

## 2017-10-21 ENCOUNTER — Other Ambulatory Visit: Payer: Self-pay

## 2017-10-27 ENCOUNTER — Other Ambulatory Visit: Payer: Self-pay

## 2017-11-05 ENCOUNTER — Other Ambulatory Visit: Payer: Self-pay

## 2017-11-08 NOTE — Patient Outreach (Signed)
First/Second/Third attempt to obtain mRS. No answer. Left message for returned call. 7  

## 2017-11-10 ENCOUNTER — Ambulatory Visit (INDEPENDENT_AMBULATORY_CARE_PROVIDER_SITE_OTHER): Payer: PPO | Admitting: Vascular Surgery

## 2017-11-10 ENCOUNTER — Other Ambulatory Visit: Payer: Self-pay

## 2017-11-10 ENCOUNTER — Encounter: Payer: Self-pay | Admitting: Vascular Surgery

## 2017-11-10 VITALS — BP 128/79 | HR 56 | Resp 20 | Ht 61.0 in | Wt 121.2 lb

## 2017-11-10 DIAGNOSIS — I6522 Occlusion and stenosis of left carotid artery: Secondary | ICD-10-CM

## 2017-11-10 NOTE — Progress Notes (Signed)
Patient name: William Patel MRN: [id number] DOB: January 23, 1934 Sex: male   REASON FOR CONSULT:    Left carotid stenosis the consult is requested by Dr. Erlinda Hong.  HPI:   William Patel is a pleasant 84 y.o. male, who was admitted on 08/06/2017 with a left MCA infarct likely embolic.  He was discharged on 08/10/2017.  I have reviewed the notes from the referring office.  He was admitted with acute onset of right-sided weakness and a aphasia.  During that admission he was noted to have an intimal hematoma proximal me 2 cm from the origin of the internal carotid artery.  This only produced an approximately 55% stenosis.  He had no significant disease on the right.  MRI showed a left brain infarct.  The patient was placed on Coumadin for 3 months and then this was discontinued and he was placed on Plavix.  He tells me that since his stroke he has made gradual improvement in the strength in his right arm.  His symptoms from the stroke involved weakness and paresthesias in the right arm, expressive a aphasia, and drooping on the right side of the face.  He had no lower extremity symptoms.  He is currently on Plavix and a statin.  He denies any history of diabetes, hypercholesterolemia, family history of premature cardiovascular disease, or smoking.  He does chew tobacco.  He does have a history of hypertension.  Past Medical History:  Diagnosis Date  . Colon cancer ([place])    Left   . Generalized headaches   . Stroke ([place])   . Visual disturbances     Family History  Problem Relation Age of Onset  . Heart failure Mother   . Brain cancer Father     SOCIAL HISTORY: Social History   Socioeconomic History  . Marital status: Widowed    Spouse name: Not on file  . Number of children: Not on file  . Years of education: Not on file  . Highest education level: Not on file  Occupational History  . Not on file  Social Needs  . Financial resource strain: Not on file  . Food insecurity:    Worry:  Not on file    Inability: Not on file  . Transportation needs:    Medical: Not on file    Non-medical: Not on file  Tobacco Use  . Smoking status: Light Tobacco Smoker    Types: Cigars  . Smokeless tobacco: Current User    Types: Chew  Substance and Sexual Activity  . Alcohol use: Yes    Alcohol/week: 1.8 oz    Types: 3 Cans of beer per week  . Drug use: No  . Sexual activity: Not on file  Lifestyle  . Physical activity:    Days per week: Not on file    Minutes per session: Not on file  . Stress: Not on file  Relationships  . Social connections:    Talks on phone: Not on file    Gets together: Not on file    Attends religious service: Not on file    Active member of club or organization: Not on file    Attends meetings of clubs or organizations: Not on file    Relationship status: Not on file  . Intimate partner violence:    Fear of current or ex partner: Not on file    Emotionally abused: Not on file    Physically abused: Not on file    Forced sexual  activity: Not on file  Other Topics Concern  . Not on file  Social History Narrative  . Not on file    Allergies  Allergen Reactions  . Tramadol Other (See Comments)    Causes headaches    Current Outpatient Medications  Medication Sig Dispense Refill  . atorvastatin (LIPITOR) 40 MG tablet Take 1 tablet (40 mg total) by mouth daily at 6 PM. 30 tablet 1  . clopidogrel (PLAVIX) 75 MG tablet Take 1 tablet (75 mg total) by mouth daily. 90 tablet 3  . sertraline (ZOLOFT) 25 MG tablet Take 1 tablet (25 mg total) by mouth daily. 30 tablet 1   No current facility-administered medications for this visit.     REVIEW OF SYSTEMS:  [X]  denotes positive finding, [ ]  denotes negative finding Cardiac  Comments:  Chest pain or chest pressure:    Shortness of breath upon exertion:    Short of breath when lying flat:    Irregular heart rhythm:        Vascular    Pain in calf, thigh, or hip brought on by ambulation: x   Pain  in feet at night that wakes you up from your sleep:     Blood clot in your veins:    Leg swelling:         Pulmonary    Oxygen at home:    Productive cough:     Wheezing:         Neurologic    Sudden weakness in arms or legs:     Sudden numbness in arms or legs:     Sudden onset of difficulty speaking or slurred speech:    Temporary loss of vision in one eye:     Problems with dizziness:  x       Gastrointestinal    Blood in stool:     Vomited blood:         Genitourinary    Burning when urinating:     Blood in urine:        Psychiatric    Major depression:         Hematologic    Bleeding problems:    Problems with blood clotting too easily:        Skin    Rashes or ulcers: x       Constitutional    Fever or chills:     PHYSICAL EXAM:   Vitals:   11/10/17 0923 11/10/17 0925  BP: 130/78 128/79  Pulse: (!) 56   Resp: 20   SpO2: 99%   Weight: 121 lb 3.2 oz (55 kg)   Height: 5\' 1"  (1.549 m)     GENERAL: The patient is a well-nourished male, in no acute distress. The vital signs are documented above. CARDIAC: There is a regular rate and rhythm.  VASCULAR: I do not detect carotid bruits. He has palpable femoral, popliteal, and posterior tibial pulses bilaterally. PULMONARY: There is good air exchange bilaterally without wheezing or rales. ABDOMEN: Soft and non-tender with normal pitched bowel sounds.  I do not palpate an abdominal aortic aneurysm. MUSCULOSKELETAL: There are no major deformities or cyanosis. NEUROLOGIC: No focal weakness or paresthesias are detected. SKIN: There are no ulcers or rashes noted. PSYCHIATRIC: The patient has a normal affect.  DATA:    CT ANGIOGRAM NECK: I have reviewed the CT angiogram of the neck that was done on 08/06/2017.  This showed a 1 cm intimal hematoma 2 cm from the origin of a  plaque in the left internal carotid artery.  There was an approximately 55% left internal carotid artery stenosis.  There was no significant disease  on the right.  MRI of the head showed a small left perirolandic infarct and punctate left MCA infarcts.  Echo that admission showed a 65 to 70% ejection fraction.  LABS: He had a hemoglobin A1c of 4.9.  LDL was 136.  MEDICAL ISSUES:   SYMPTOMATIC 55% LEFT CAROTID STENOSIS: This patient had an irregular plaque in the internal carotid artery producing approximately 55% stenosis.  However given the irregularity of the plaque this is likely the source of his symptoms.  This was treated with Coumadin for 3 months and now he is on Plavix.  He has had no further symptoms.  I have recommended a follow-up carotid duplex scan in 3 months which will be approximately 6 months from his CT scan.  Given that he is remained stable with maximal medical therapy I would only consider carotid endarterectomy if the stenosis progressed significantly or he develop new left hemispheric symptoms.  Fortunately he is not a smoker.  He is on Plavix and is on a statin.  I will plan to see him back in 3 months.  He knows to call sooner if he has problems.  Deitra Mayo [facility] [phone]

## 2017-11-19 ENCOUNTER — Telehealth: Payer: Self-pay | Admitting: Adult Health

## 2017-11-19 NOTE — Telephone Encounter (Signed)
Received fax regarding William Patel being discharged from skilled speech therapy intervention at [facility] on 11/08/17. He received a total of 7 visits. He was encouraged to follow strategies for dysarthria that were reviewed during therapy sessions. Prior to d/c, patient was demonstrating approx 84% accuracy at sentence levels  Where prior he was only able to produce 56% accuracy. Speech therapy was recommended to be continued at that time due to continued challenges with articulation, loudness and prosody. Even with recommendations to continue, patient elected not to pursue speech therapy services.

## 2017-12-21 ENCOUNTER — Ambulatory Visit (INDEPENDENT_AMBULATORY_CARE_PROVIDER_SITE_OTHER): Payer: PPO | Admitting: Adult Health

## 2017-12-21 ENCOUNTER — Encounter: Payer: Self-pay | Admitting: Adult Health

## 2017-12-21 VITALS — BP 118/72 | HR 72 | Wt 120.6 lb

## 2017-12-21 DIAGNOSIS — I63412 Cerebral infarction due to embolism of left middle cerebral artery: Secondary | ICD-10-CM | POA: Diagnosis not present

## 2017-12-21 DIAGNOSIS — I10 Essential (primary) hypertension: Secondary | ICD-10-CM | POA: Diagnosis not present

## 2017-12-21 DIAGNOSIS — E785 Hyperlipidemia, unspecified: Secondary | ICD-10-CM | POA: Diagnosis not present

## 2017-12-21 MED ORDER — ATORVASTATIN CALCIUM 40 MG PO TABS: 40.0000 mg | ORAL_TABLET | Freq: Every day | ORAL | 4 refills | Status: DC

## 2017-12-21 NOTE — Progress Notes (Signed)
I agree with the above plan 

## 2017-12-21 NOTE — Patient Instructions (Signed)
Continue clopidogrel 75 mg daily  and lipitor  for secondary stroke prevention  Continue to follow up with PCP regarding cholesterol and blood pressure management   You should be hearing from home health agency for additional occupational therapy   Continue to stay active and eat a healthy diet  Maintain strict control of hypertension with blood pressure goal below 130/90, diabetes with hemoglobin A1c goal below 6.5% and cholesterol with LDL cholesterol (bad cholesterol) goal below 70 mg/dL. I also advised the patient to eat a healthy diet with plenty of whole grains, cereals, fruits and vegetables, exercise regularly and maintain ideal body weight.  Followup in the future with me in 6 months or call earlier if needed       Thank you for coming to see [place] at [facility]. I hope we have been able to provide you high quality care today.  You may receive a patient satisfaction survey over the next few weeks. We would appreciate your feedback and comments so that we may continue to improve ourselves and the health of our patients.

## 2017-12-21 NOTE — Progress Notes (Signed)
[facility] [address] [place]. [place] [address] [phone]       OFFICE FOLLOW UP NOTE  William Patel Date of Birth:  05-11-1934 Medical Record Number:  [id number]   Reason for Referral:  hospital stroke follow up  CHIEF COMPLAINT:  Chief Complaint  Patient presents with  . Follow-up    Stroke follow up room 9 pt with daughter Butch Penny     HPI: William Patel is being seen today in the office for left MCA on 08/06/17. History obtained from patient and chart review. Reviewed all radiology images and labs personally.  William Patel an 84 y.o.malewith medical history significant forcolon cancer status post resection, hypertension, CKD III and chronic diastolic CHF who presents to the ED with acute onset right sided weakness and aphasia. Patient was in his usual state of health, at baseline ambulatory without assistance. He was at the [facility] with his family got into the car to drive home and in the parking lot started having acute right-sided weakness, inability to move his right side with this aphasia. EMS was immediately called, and on arrival patient states right-sided symptoms continue to persist with a right facial droop blood pressure 150/90. On admission to the ER, patient was aphasic attempting to mumble, with right facial droop and some drooling. Right arm no effort against gravity but able to move bilateral lower extremities equally with mild right lower extremity weakness. Immediate CT head showed disproportionately dense left M2 versus M3 MCA, possible thromboembolism in a background of probable hemoconcentration. Family was called by Dr. Leonel Ramsay who was able to speak to patient's son, received implied consent and patient given immediate TPA At 1745pm. CTA of the head showed paucity of distal left MCA vessels associated with acute small LEFT frontoparietal/MCA territory nonhemorrhagic infarct. CTA Neck showed left internal carotid  artery 1 cm intimal hematoma at risk for embolization as well as severe bilateral C4-5 neural foraminal narrowing.  MRI brain reviewed and showed small left perirolandic infarct and punctate left MCA infarcts.  2D echo showed EF of 65 to 70%.  LDL 136 and A1c satisfactory 4.9.  Patient was not on antithrombotic prior to admission and this patient was on heparin drip during admission patient was discharged on Coumadin.  Recommended to continue Coumadin for to 3 months and repeat CT of neck and if thrombus resolved at that time recommended to change to antiplatelet therapy.  Patient was not on statin prior to admission and recommended to be discharged on Lipitor 40 mg.  PT OT recommended outpatient PT OT and patient discharged home in stable condition.  Since discharge, patient has been improving.  PT/OT have recently been completed but continue speech twice a week.  Right hand flaccid but strong and rest of extremity.  Daughter questioning if he needs further occupational therapy.  Continues to have moderate expressive aphasia and dysarthria.  Right facial droop still present.  Continues to take Coumadin and PCP does INR checks and current regimen 5 mg every other day and 2.5 mg every other day.  INR has been stable at this dose and denies bleeding or bruising.  Continues to take Lipitor without side effects of myalgias.  Right hand is slightly swollen and advised patient that he needs to keep this elevated and continue to move it with opposite hand.  Denies pain, warmth, or redness.  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  Denies new or worsening  stroke/TIA symptoms.  12/21/17 UPDATE: Patient returns today for 82-month follow-up and is accompanied by his daughter.  At previous appointment, right hand was flaccid but now he is able to move it and is currently weak.  He continues to stay active while driving his tractor and doing outdoor activities  without complications or issues.  He was going to outpatient therapy but was having difficulty maintaining payments and transportation to get to therapy appointments.  Continues to have expressive aphasia but per daughter and patient does also has been improving.  Patient has stopped Coumadin and started Plavix with mild bruising but no bleeding.  Continues to take Lipitor without side effects myalgias.  Blood pressure today satisfactory 118/72.  Denies new or worsening stroke/TIA symptoms.   ROS:   14 system review of systems performed and negative with exception of\ weakness and slurred speech  PMH:  Past Medical History:  Diagnosis Date  . Colon cancer ([place])    Left   . Generalized headaches   . Stroke ([place])   . Visual disturbances     PSH:  Past Surgical History:  Procedure Laterality Date  . COLON RESECTION  09/21/2011   Procedure: COLON RESECTION LAPAROSCOPIC;  Surgeon: Pedro Earls, MD;  Location: [facility];  Service: General;  Laterality: N/A;     . COLONOSCOPY  feb 2013   endo done also    Social History:  Social History   Socioeconomic History  . Marital status: Widowed    Spouse name: Not on file  . Number of children: Not on file  . Years of education: Not on file  . Highest education level: Not on file  Occupational History  . Not on file  Social Needs  . Financial resource strain: Not on file  . Food insecurity:    Worry: Not on file    Inability: Not on file  . Transportation needs:    Medical: Not on file    Non-medical: Not on file  Tobacco Use  . Smoking status: Light Tobacco Smoker    Types: Cigars  . Smokeless tobacco: Current User    Types: Chew  . Tobacco comment: 1 to 2 per month  Substance and Sexual Activity  . Alcohol use: Yes    Alcohol/week: 1.8 oz    Types: 3 Cans of beer per week  . Drug use: No  . Sexual activity: Not on file  Lifestyle  . Physical activity:    Days per week: Not on file    Minutes per session: Not on file  .  Stress: Not on file  Relationships  . Social connections:    Talks on phone: Not on file    Gets together: Not on file    Attends religious service: Not on file    Active member of club or organization: Not on file    Attends meetings of clubs or organizations: Not on file    Relationship status: Not on file  . Intimate partner violence:    Fear of current or ex partner: Not on file    Emotionally abused: Not on file    Physically abused: Not on file    Forced sexual activity: Not on file  Other Topics Concern  . Not on file  Social History Narrative  . Not on file    Family History:  Family History  Problem Relation Age of Onset  . Heart failure Mother   . Brain cancer Father   . Stroke Brother  Medications:   Current Outpatient Medications on File Prior to Visit  Medication Sig Dispense Refill  . clopidogrel (PLAVIX) 75 MG tablet Take 1 tablet (75 mg total) by mouth daily. 90 tablet 3   No current facility-administered medications on file prior to visit.     Allergies:   Allergies  Allergen Reactions  . Tramadol Other (See Comments)    Causes headaches     Physical Exam  Vitals:   12/21/17 1451  BP: 118/72  Pulse: 72  Weight: 120 lb 9.6 oz (54.7 kg)   Body mass index is 22.79 kg/m. No exam data present  General: well developed, pleasant elderly Caucasian male, well nourished, seated, in no evident distress Head: head normocephalic and atraumatic.   Neck: supple with no carotid or supraclavicular bruits Cardiovascular: regular rate and rhythm, no murmurs Musculoskeletal: no deformity Skin:  no rash/petichiae Vascular:  Normal pulses all extremities  Neurologic Exam Mental Status: Awake and fully alert.  Moderate expressive aphasia and moderate dysarthria present oriented to place and time. Recent and remote memory intact. Attention span, concentration and fund of knowledge appropriate. Mood and affect appropriate.  Cranial Nerves: Fundoscopic exam  reveals sharp disc margins. Pupils equal, briskly reactive to light. Extraocular movements full without nystagmus. Visual fields full to confrontation. Hearing intact. Facial sensation intact.  Mild right lower facial paralysis Motor: Normal bulk and tone. Normal strength in all tested extremity muscles.  Right hand weak (previously flaccid) Sensory.: intact to touch , pinprick , position and vibratory sensation.  Coordination: Rapid alternating movements normal in left upper extremity. Finger-to-nose and heel-to-shin performed accurately in left upper extremity and bilateral lower extremities Gait and Station: Arises from chair without difficulty. Stance is normal. Gait demonstrates normal stride length and balance . Able to heel, toe and tandem walk without difficulty.  Reflexes: 1+ and symmetric. Toes downgoing.     Diagnostic Data (Labs, Imaging, Testing)   Ct Angio Head W Or Wo Contrast Ct Angio Neck W Or Wo Contrast 08/06/2017  CTA NECK:  1. LEFT internal carotid artery 1 cm intimal hematoma approximately 2 cm from the origin with mobile low-density plaque/hematoma in central lumen, at risk for embolization. Approximately 55% stenosis LEFT ICA.  2. Severe bilateral C4-5 neural foraminal narrowing.  CTA HEAD:  1. No emergent large vessel occlusion. However, paucity of distal LEFT MCA vessels associated with acute small LEFT frontoparietal/MCA territory nonhemorrhagic infarct.   Ct Head Code Stroke Wo Contrast 08/06/2017 IMPRESSION:  1. Disproportionately dense LEFT M2 versus M3 MCA, possible thromboembolism in a background of probable hemoconcentration.  2. ASPECTS is 10.   MRI 1. Moderate acute left perirolandic infarct. Punctate recent infarcts in the left caudate head and left occipital parietal cortex. These could all be explained by the patient's left cervical ICA thrombus. 2. Partial and motion degraded study.  TTE - Left ventricle: The cavity size was normal. Wall  thickness was normal. Systolic function was vigorous. The estimated ejection fraction was in the range of 65% to 70%. Wall motion was normal; there were no regional wall motion abnormalities. Doppler parameters are consistent with abnormal left ventricular relaxation (grade 1 diastolic dysfunction). - Aortic valve: There was trivial regurgitation.    ASSESSMENT: SOMA LIZAK is a 84 y.o. year old male here with left MCA on 08/06/17 secondary to left ICA plaque with intimal hematoma. Vascular risk factors include HLD, HTN and CHF.    PLAN: -Continue clopidogrel 75 mg daily  and Lipitor for secondary stroke prevention -  F/u with PCP regarding your HLD and HTN -continue to monitor BP at home -Referral placed for home OT as patient has a difficult time with transportation -Maintain strict control of hypertension with blood pressure goal below 130/90, diabetes with hemoglobin A1c goal below 6.5% and cholesterol with LDL cholesterol (bad cholesterol) goal below 70 mg/dL. I also advised the patient to eat a healthy diet with plenty of whole grains, cereals, fruits and vegetables, exercise regularly and maintain ideal body weight.  Follow up in 6 months or call earlier if needed  Greater than 50% time during this 25 minute consultation visit was spent on counseling and coordination of care about HLD, and HTN, discussion about risk benefit of anticoagulation and answering questions.   Venancio Poisson, AGNP-BC  [facility] [address]  [place], [place] [address]  Phone [phone] Fax [phone]

## 2017-12-24 ENCOUNTER — Telehealth: Payer: Self-pay | Admitting: Adult Health

## 2017-12-24 NOTE — Telephone Encounter (Signed)
Please advise. Would you like to change referral to Neuro Rehab?

## 2017-12-24 NOTE — Telephone Encounter (Signed)
PT William Patel @ [facility] has called re: the referral received.  The pt is required to be home bound and this pt is not so they will not be able to proceed with the referral.  PT William Patel said this pt would benefit more from [facility] Out pt.  PT William Patel said pt could benefit from therapy for his right hand but he is functioning on a high level. Pt William Patel stated he was thankful for the referral

## 2017-12-30 NOTE — Telephone Encounter (Signed)
I have tried all my resources patient is going to have to go to physical therapy . Patient can't have Home physical  therapy at Home. I have called and talked to patient's daughter and patient is not going to do PT at this this time.

## 2018-02-09 ENCOUNTER — Ambulatory Visit (HOSPITAL_COMMUNITY): Payer: PPO

## 2018-02-09 ENCOUNTER — Ambulatory Visit: Payer: PPO | Admitting: Vascular Surgery

## 2018-03-16 ENCOUNTER — Ambulatory Visit (INDEPENDENT_AMBULATORY_CARE_PROVIDER_SITE_OTHER): Payer: PPO | Admitting: Vascular Surgery

## 2018-03-16 ENCOUNTER — Ambulatory Visit (HOSPITAL_COMMUNITY)
Admission: RE | Admit: 2018-03-16 | Discharge: 2018-03-16 | Disposition: A | Discharge status: Home / Self Care | Payer: PPO | Source: Ambulatory Visit | Attending: Vascular Surgery | Admitting: Vascular Surgery

## 2018-03-16 ENCOUNTER — Encounter: Payer: Self-pay | Admitting: Vascular Surgery

## 2018-03-16 VITALS — BP 110/70 | HR 61 | Temp 97.0°F | Resp 16 | Ht 62.0 in | Wt 124.0 lb

## 2018-03-16 DIAGNOSIS — I6522 Occlusion and stenosis of left carotid artery: Secondary | ICD-10-CM | POA: Insufficient documentation

## 2018-03-16 NOTE — Progress Notes (Signed)
Patient name: William Patel MRN: [id number] DOB: 10/18/1933 Sex: male  REASON FOR VISIT:   Follow-up of left carotid stenosis.  HPI:   William Patel is a pleasant 84 y.o. male who I saw in consultation on 11/10/2017 with a symptomatic left carotid stenosis.  The patient had been admitted on 08/06/2017 with a left middle cerebral artery infarct which was felt to be likely embolic.  The patient was admitted with acute onset of right-sided weakness and a aphasia.  The patient had a CT scan which showed a 55% stenosis of the internal carotid artery.  MRI showed a left brain infarct.  The patient was placed on Coumadin for 3 months and then this was discontinued and he was placed on Plavix.  Weakness in his right arm gradually improved.  The patient has had no further symptoms when I saw him last and therefore I felt it was reasonable to obtain a carotid duplex scan in 3 months which would be 6 months from his CT scan.  If this remains stable we could consider continuing maximal medical therapy and avoiding carotid endarterectomy in this 84 year old gentleman.  He was on Plavix and a statin.  He comes in for a 21-month follow-up visit.  Since I saw him last, he denies any history of new weakness or paresthesias in his upper extremities or lower extremities.  He states that the weakness in his right upper extremity has improved as has his speech. He continues to remain very active and rides his tractor, splits wood, and cuts his grass.  He is on Plavix and a statin.    Current Outpatient Medications  Medication Sig Dispense Refill  . atorvastatin (LIPITOR) 40 MG tablet Take 1 tablet (40 mg total) by mouth daily. 90 tablet 4  . clopidogrel (PLAVIX) 75 MG tablet Take 1 tablet (75 mg total) by mouth daily. 90 tablet 3   No current facility-administered medications for this visit.     REVIEW OF SYSTEMS:  [X]  denotes positive finding, [ ]  denotes negative finding Vascular    Leg swelling    Cardiac     Chest pain or chest pressure:    Shortness of breath upon exertion:    Short of breath when lying flat:    Irregular heart rhythm:    Constitutional    Fever or chills:     PHYSICAL EXAM:   Vitals:   03/16/18 1557 03/16/18 1559  BP: (!) 115/59 110/70  Pulse: 63 61  Resp: 16   Temp: (!) 97 F (36.1 C)   TempSrc: Oral   SpO2: 100% 99%  Weight: 124 lb (56.2 kg)   Height: 5\' 2"  (1.575 m)     GENERAL: The patient is a well-nourished male, in no acute distress. The vital signs are documented above. CARDIOVASCULAR: There is a regular rate and rhythm. PULMONARY: There is good air exchange bilaterally without wheezing or rales. VASCULAR: I do not detect carotid bruits. He has palpable femoral and dorsalis pedis pulses.  DATA:   CAROTID DUPLEX: I have independently interpreted his carotid duplex scan today.  On the left side, which is the side of concern, he has a less than 39% stenosis.  On the right side he has a less than 39% stenosis.  Both vertebral arteries are patent with antegrade flow.  MEDICAL ISSUES:   LEFT CAROTID STENOSIS: The stenosis on the left has improved over the last 6 months.  Duplex scan suggest a less than 39% stenosis.  He  has had no new neurologic symptoms.  He has right-sided weakness and a aphasia have improved.  He is on Plavix and a statin.  Given that he has had no new symptoms we will continue to follow this.  I ordered a follow-up carotid duplex scan in 6 months and I will see him back at that time.  He knows to call sooner if he has problems.  Deitra Mayo [facility] [phone]

## 2018-06-23 ENCOUNTER — Ambulatory Visit (INDEPENDENT_AMBULATORY_CARE_PROVIDER_SITE_OTHER): Payer: PPO | Admitting: Adult Health

## 2018-06-23 ENCOUNTER — Encounter: Payer: Self-pay | Admitting: Adult Health

## 2018-06-23 VITALS — BP 116/75 | HR 62 | Ht 62.0 in | Wt 128.0 lb

## 2018-06-23 DIAGNOSIS — I63412 Cerebral infarction due to embolism of left middle cerebral artery: Secondary | ICD-10-CM | POA: Diagnosis not present

## 2018-06-23 DIAGNOSIS — I6522 Occlusion and stenosis of left carotid artery: Secondary | ICD-10-CM

## 2018-06-23 DIAGNOSIS — I69322 Dysarthria following cerebral infarction: Secondary | ICD-10-CM

## 2018-06-23 DIAGNOSIS — I10 Essential (primary) hypertension: Secondary | ICD-10-CM | POA: Diagnosis not present

## 2018-06-23 DIAGNOSIS — E785 Hyperlipidemia, unspecified: Secondary | ICD-10-CM

## 2018-06-23 MED ORDER — CLOPIDOGREL BISULFATE 75 MG PO TABS: 75.0000 mg | ORAL_TABLET | Freq: Every day | ORAL | 3 refills | Status: DC

## 2018-06-23 NOTE — Patient Instructions (Signed)
Continue clopidogrel 75 mg daily  and atorvastatin for secondary stroke prevention  Continue to follow up with PCP regarding cholesterol and blood pressure management   Follow-up with vascular surgery around 08/2018 for repeat ultrasound  Continue to monitor blood pressure at home  Maintain strict control of hypertension with blood pressure goal below 130/90, diabetes with hemoglobin A1c goal below 6.5% and cholesterol with LDL cholesterol (bad cholesterol) goal below 70 mg/dL. I also advised the patient to eat a healthy diet with plenty of whole grains, cereals, fruits and vegetables, exercise regularly and maintain ideal body weight.  Followup in the future with me in 6 months or call earlier if needed       Thank you for coming to see [place] at [facility]. I hope we have been able to provide you high quality care today.  You may receive a patient satisfaction survey over the next few weeks. We would appreciate your feedback and comments so that we may continue to improve ourselves and the health of our patients.

## 2018-06-23 NOTE — Progress Notes (Signed)
[facility] [address] [place]. [place] [address] [phone]       OFFICE FOLLOW UP NOTE  Mr. ELIGIO ANGERT Date of Birth:  09-21-1933 Medical Record Number:  [id number]   Reason for Referral:  hospital stroke follow up  CHIEF COMPLAINT:  Chief Complaint  Patient presents with  . Follow-up    [facility] follow up room 9 pt with Liliane Channel his son in law     HPI: AUGUSTA HILBERT is being seen today in the office for left MCA on 08/06/17. History obtained from patient and chart review. Reviewed all radiology images and labs personally.  Micha Dosanjh Hardenis an 85 y.o.malewith medical history significant forcolon cancer status post resection, hypertension, CKD III and chronic diastolic CHF who presents to the ED with acute onset right sided weakness and aphasia. Patient was in his usual state of health, at baseline ambulatory without assistance. He was at the [facility] with his family got into the car to drive home and in the parking lot started having acute right-sided weakness, inability to move his right side with this aphasia. EMS was immediately called, and on arrival patient states right-sided symptoms continue to persist with a right facial droop blood pressure 150/90. On admission to the ER, patient was aphasic attempting to mumble, with right facial droop and some drooling. Right arm no effort against gravity but able to move bilateral lower extremities equally with mild right lower extremity weakness. Immediate CT head showed disproportionately dense left M2 versus M3 MCA, possible thromboembolism in a background of probable hemoconcentration. Family was called by Dr. Leonel Ramsay who was able to speak to patient's son, received implied consent and patient given immediate TPA At 1745pm. CTA of the head showed paucity of distal left MCA vessels associated with acute small LEFT frontoparietal/MCA territory nonhemorrhagic infarct. CTA Neck showed left internal carotid  artery 1 cm intimal hematoma at risk for embolization as well as severe bilateral C4-5 neural foraminal narrowing.  MRI brain reviewed and showed small left perirolandic infarct and punctate left MCA infarcts.  2D echo showed EF of 65 to 70%.  LDL 136 and A1c satisfactory 4.9.  Patient was not on antithrombotic prior to admission and this patient was on heparin drip during admission patient was discharged on Coumadin.  Recommended to continue Coumadin for to 3 months and repeat CT of neck and if thrombus resolved at that time recommended to change to antiplatelet therapy.  Patient was not on statin prior to admission and recommended to be discharged on Lipitor 40 mg.  PT OT recommended outpatient PT OT and patient discharged home in stable condition.  Since discharge, patient has been improving.  PT/OT have recently been completed but continue speech twice a week.  Right hand flaccid but strong and rest of extremity.  Daughter questioning if he needs further occupational therapy.  Continues to have moderate expressive aphasia and dysarthria.  Right facial droop still present.  Continues to take Coumadin and PCP does INR checks and current regimen 5 mg every other day and 2.5 mg every other day.  INR has been stable at this dose and denies bleeding or bruising.  Continues to take Lipitor without side effects of myalgias.  Right hand is slightly swollen and advised patient that he needs to keep this elevated and continue to move it with opposite hand.  Denies pain, warmth, or redness.  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  Denies  new or worsening stroke/TIA symptoms.  12/21/17 UPDATE: Patient returns today for 83-month follow-up and is accompanied by his daughter.  At previous appointment, right hand was flaccid but now he is able to move it and is currently weak.  He continues to stay active while driving his tractor and doing outdoor activities  without complications or issues.  He was going to outpatient therapy but was having difficulty maintaining payments and transportation to get to therapy appointments.  Continues to have expressive aphasia but per daughter and patient does also has been improving.  Patient has stopped Coumadin and started Plavix with mild bruising but no bleeding.  Continues to take Lipitor without side effects myalgias.  Blood pressure today satisfactory 118/72.  Denies new or worsening stroke/TIA symptoms.  Interval history 06/23/2018: Patient is being seen today for follow-up visit and is accompanied by his son-in-law.  He continues to do well from a stroke standpoint. He continues to have right hand weakness along with dysarthria but does endorse improvement.   Continues on Plavix with mild bruising but no bleeding. Continues on atorvastatin without side effects of myalgias.  Blood pressure today 116/75.  He did have follow-up appointment with vascular surgery on 03/16/2018 with repeat duplex scan showing less than 39% left ICA stenosis and recommended follow-up carotid duplex scan in 6 months time.  He does endorse 1 week burning sensation on the top of his feet bilaterally.  He denies any numbness/tingling or radiating burning sensation.  He does endorse wearing steel toe work boots from the time he gets up until he goes to bed.  He does endorse a sensation greater at nighttime.  Denies new or worsening stroke/TIA symptoms.   ROS:   14 system review of systems performed and negative with exception of\ weakness and slurred speech  PMH:  Past Medical History:  Diagnosis Date  . Colon cancer ([place])    Left   . Generalized headaches   . Stroke ([place])   . Visual disturbances     PSH:  Past Surgical History:  Procedure Laterality Date  . COLON RESECTION  09/21/2011   Procedure: COLON RESECTION LAPAROSCOPIC;  Surgeon: Pedro Earls, MD;  Location: [facility];  Service: General;  Laterality: N/A;     . COLONOSCOPY   feb 2013   endo done also    Social History:  Social History   Socioeconomic History  . Marital status: Widowed    Spouse name: Not on file  . Number of children: Not on file  . Years of education: Not on file  . Highest education level: Not on file  Occupational History  . Not on file  Social Needs  . Financial resource strain: Not on file  . Food insecurity:    Worry: Not on file    Inability: Not on file  . Transportation needs:    Medical: Not on file    Non-medical: Not on file  Tobacco Use  . Smoking status: Former Smoker    Types: Cigars  . Smokeless tobacco: Current User    Types: Chew  . Tobacco comment: 1 to 2 per month  Substance and Sexual Activity  . Alcohol use: Yes    Alcohol/week: 3.0 standard drinks    Types: 3 Cans of beer per week  . Drug use: No  . Sexual activity: Not on file  Lifestyle  . Physical activity:    Days per week: Not on file    Minutes per session: Not on file  .  Stress: Not on file  Relationships  . Social connections:    Talks on phone: Not on file    Gets together: Not on file    Attends religious service: Not on file    Active member of club or organization: Not on file    Attends meetings of clubs or organizations: Not on file    Relationship status: Not on file  . Intimate partner violence:    Fear of current or ex partner: Not on file    Emotionally abused: Not on file    Physically abused: Not on file    Forced sexual activity: Not on file  Other Topics Concern  . Not on file  Social History Narrative  . Not on file    Family History:  Family History  Problem Relation Age of Onset  . Heart failure Mother   . Brain cancer Father   . Stroke Brother     Medications:   Current Outpatient Medications on File Prior to Visit  Medication Sig Dispense Refill  . atorvastatin (LIPITOR) 40 MG tablet Take 1 tablet (40 mg total) by mouth daily. 90 tablet 4  . clopidogrel (PLAVIX) 75 MG tablet Take 1 tablet (75 mg  total) by mouth daily. 90 tablet 3   No current facility-administered medications on file prior to visit.     Allergies:   Allergies  Allergen Reactions  . Tramadol Other (See Comments)    Causes headaches     Physical Exam  Vitals:   06/23/18 1448  Weight: 128 lb (58.1 kg)  Height: 5\' 2"  (1.575 m)   Body mass index is 23.41 kg/m. No exam data present  General: well developed, pleasant elderly Caucasian male, well nourished, seated, in no evident distress Head: head normocephalic and atraumatic.   Neck: supple with no carotid or supraclavicular bruits Cardiovascular: regular rate and rhythm, no murmurs Musculoskeletal: no deformity Skin:  no rash/petichiae Vascular:  Normal pulses all extremities  Neurologic Exam Mental Status: Awake and fully alert. Moderate dysarthria present oriented to place and time. Recent and remote memory intact. Attention span, concentration and fund of knowledge appropriate. Mood and affect appropriate.  Cranial Nerves: Fundoscopic exam reveals sharp disc margins. Pupils equal, briskly reactive to light. Extraocular movements full without nystagmus. Visual fields full to confrontation. Hearing intact. Facial sensation intact.  Mild right lower facial paralysis Motor: Normal bulk and tone. Normal strength in all tested extremity muscles.  Right hand weak with spasticity Sensory.: intact to touch , pinprick , position and vibratory sensation.  Normal sensation in bilateral lower extremities Coordination: Rapid alternating movements normal in left upper extremity. Finger-to-nose and heel-to-shin performed accurately in left upper extremity and bilateral lower extremities Gait and Station: Arises from chair without difficulty. Stance is normal. Gait demonstrates normal stride length and balance . Able to heel, toe and tandem walk without difficulty.  Reflexes: 1+ and symmetric. Toes downgoing.     Diagnostic Data (Labs, Imaging, Testing)   Ct Angio  Head W Or Wo Contrast Ct Angio Neck W Or Wo Contrast 08/06/2017  CTA NECK:  1. LEFT internal carotid artery 1 cm intimal hematoma approximately 2 cm from the origin with mobile low-density plaque/hematoma in central lumen, at risk for embolization. Approximately 55% stenosis LEFT ICA.  2. Severe bilateral C4-5 neural foraminal narrowing.  CTA HEAD:  1. No emergent large vessel occlusion. However, paucity of distal LEFT MCA vessels associated with acute small LEFT frontoparietal/MCA territory nonhemorrhagic infarct.   Ct Head Code  Stroke Wo Contrast 08/06/2017 IMPRESSION:  1. Disproportionately dense LEFT M2 versus M3 MCA, possible thromboembolism in a background of probable hemoconcentration.  2. ASPECTS is 10.   MRI 1. Moderate acute left perirolandic infarct. Punctate recent infarcts in the left caudate head and left occipital parietal cortex. These could all be explained by the patient's left cervical ICA thrombus. 2. Partial and motion degraded study.  TTE - Left ventricle: The cavity size was normal. Wall thickness was normal. Systolic function was vigorous. The estimated ejection fraction was in the range of 65% to 70%. Wall motion was normal; there were no regional wall motion abnormalities. Doppler parameters are consistent with abnormal left ventricular relaxation (grade 1 diastolic dysfunction). - Aortic valve: There was trivial regurgitation.    ASSESSMENT: TORRIS HOUSE is a 85 y.o. year old male here with left MCA on 08/06/17 secondary to left ICA plaque with intimal hematoma. Vascular risk factors include HLD, HTN and CHF.  Patient is being seen today for follow-up visit and overall is doing well from a stroke standpoint with residual deficit of moderate dysarthria and right hand weakness with spasticity.  He also endorses new onset of burning sensation on top of feet bilaterally.   PLAN: -Continue clopidogrel 75 mg daily  and Lipitor for secondary stroke  prevention -refill for Plavix placed but request future refills by PCP -Bilateral feet burning sensation -localized on top of feet bilaterally without loss of sensation or radiating pain.  As he does wear heavy, steel toe work boots daily this could likely be the cause.  It was advised that he only wear the shoes if needed and to wear lighter shoes while in the house.  He was also advised to continue to monitor his skin along with monitoring for worsening or numbness/tingling. -F/u with PCP regarding your HLD and HTN -F/U with vascular surgery around 08/2018 for repeat carotid duplex -continue to monitor BP at home -Continue to stay active and maintain a healthy diet -Maintain strict control of hypertension with blood pressure goal below 130/90, diabetes with hemoglobin A1c goal below 6.5% and cholesterol with LDL cholesterol (bad cholesterol) goal below 70 mg/dL. I also advised the patient to eat a healthy diet with plenty of whole grains, cereals, fruits and vegetables, exercise regularly and maintain ideal body weight.  Follow up in 6 months or call earlier if needed  Greater than 50% time during this 25 minute consultation visit was spent on counseling and coordination of care about HLD, and HTN, discussion about risk benefit of anticoagulation and answering questions.   Venancio Poisson, AGNP-BC  [facility] [address] [place], [place] [address]  Phone [phone] Fax [phone]

## 2018-06-28 NOTE — Progress Notes (Signed)
I agree with the above plan 

## 2018-12-22 ENCOUNTER — Ambulatory Visit: Payer: PPO | Admitting: Adult Health

## 2019-02-20 ENCOUNTER — Other Ambulatory Visit: Payer: Self-pay | Admitting: Adult Health

## 2019-02-22 ENCOUNTER — Telehealth: Payer: Self-pay | Admitting: Adult Health

## 2019-02-22 NOTE — Telephone Encounter (Signed)
I called and LMVM for BP and cholesterol meds to be refilled by pcp.  She is to call back if questions.

## 2019-02-22 NOTE — Telephone Encounter (Signed)
Pt's wife called stating that she would like to know when the pt's atorvastatin (LIPITOR) 40 MG tablet has been denied. He is needing a refill and needing it sent to the [facility] on [address] Please advise.

## 2019-02-27 DIAGNOSIS — H8110 Benign paroxysmal vertigo, unspecified ear: Secondary | ICD-10-CM | POA: Diagnosis not present

## 2019-02-27 DIAGNOSIS — R42 Dizziness and giddiness: Secondary | ICD-10-CM | POA: Diagnosis not present

## 2019-03-13 ENCOUNTER — Ambulatory Visit: Payer: PPO | Admitting: Adult Health

## 2019-03-13 NOTE — Progress Notes (Deleted)
[facility] [address] [place]. [place] [address] [phone]       OFFICE FOLLOW UP NOTE  Mr. William Patel Date of Birth:  [birth year] Medical Record Number:  [id number]   Reason for Referral:  hospital stroke follow up  CHIEF COMPLAINT:  No chief complaint on file.   HPI: William Patel is being seen today in the office for left MCA on 08/06/17. History obtained from patient and chart review. Reviewed all radiology images and labs personally.  William Patel an 85 y.o.malewith medical history significant forcolon cancer status post resection, hypertension, CKD III and chronic diastolic CHF who presents to the ED with acute onset right sided weakness and aphasia. Patient was in his usual state of health, at baseline ambulatory without assistance. He was at the [facility] with his family got into the car to drive home and in the parking lot started having acute right-sided weakness, inability to move his right side with this aphasia. EMS was immediately called, and on arrival patient states right-sided symptoms continue to persist with a right facial droop blood pressure 150/90. On admission to the ER, patient was aphasic attempting to mumble, with right facial droop and some drooling. Right arm no effort against gravity but able to move bilateral lower extremities equally with mild right lower extremity weakness. Immediate CT head showed disproportionately dense left M2 versus M3 MCA, possible thromboembolism in a background of probable hemoconcentration. Family was called by Dr. Leonel Ramsay who was able to speak to patient's son, received implied consent and patient given immediate TPA At 1745pm. CTA of the head showed paucity of distal left MCA vessels associated with acute small LEFT frontoparietal/MCA territory nonhemorrhagic infarct. CTA Neck showed left internal carotid artery 1 cm intimal hematoma at risk for embolization as well as severe bilateral  C4-5 neural foraminal narrowing.  MRI brain reviewed and showed small left perirolandic infarct and punctate left MCA infarcts.  2D echo showed EF of 65 to 70%.  LDL 136 and A1c satisfactory 4.9.  Patient was not on antithrombotic prior to admission and this patient was on heparin drip during admission patient was discharged on Coumadin.  Recommended to continue Coumadin for to 3 months and repeat CT of neck and if thrombus resolved at that time recommended to change to antiplatelet therapy.  Patient was not on statin prior to admission and recommended to be discharged on Lipitor 40 mg.  PT OT recommended outpatient PT OT and patient discharged home in stable condition.  Since discharge, patient has been improving.  PT/OT have recently been completed but continue speech twice a week.  Right hand flaccid but strong and rest of extremity.  Daughter questioning if he needs further occupational therapy.  Continues to have moderate expressive aphasia and dysarthria.  Right facial droop still present.  Continues to take Coumadin and PCP does INR checks and current regimen 5 mg every other day and 2.5 mg every other day.  INR has been stable at this dose and denies bleeding or bruising.  Continues to take Lipitor without side effects of myalgias.  Right hand is slightly swollen and advised patient that he needs to keep this elevated and continue to move it with opposite hand.  Denies pain, warmth, or redness.  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  Denies new or worsening stroke/TIA symptoms.  12/21/17 UPDATE: Patient returns today for 16-month follow-up and is accompanied by his daughter.  At previous appointment, right hand was flaccid but now he is able to move it and is currently weak.  He continues to stay active while driving his tractor and doing outdoor activities without complications or issues.  He was going to outpatient therapy but was having  difficulty maintaining payments and transportation to get to therapy appointments.  Continues to have expressive aphasia but per daughter and patient does also has been improving.  Patient has stopped Coumadin and started Plavix with mild bruising but no bleeding.  Continues to take Lipitor without side effects myalgias.  Blood pressure today satisfactory 118/72.  Denies new or worsening stroke/TIA symptoms.  Interval history 06/23/2018: Patient is being seen today for follow-up visit and is accompanied by his son-in-law.  He continues to do well from a stroke standpoint. He continues to have right hand weakness along with dysarthria but does endorse improvement.   Continues on Plavix with mild bruising but no bleeding. Continues on atorvastatin without side effects of myalgias.  Blood pressure today 116/75.  He did have follow-up appointment with vascular surgery on 03/16/2018 with repeat duplex scan showing less than 39% left ICA stenosis and recommended follow-up carotid duplex scan in 6 months time.  He does endorse 1 week burning sensation on the top of his feet bilaterally.  He denies any numbness/tingling or radiating burning sensation.  He does endorse wearing steel toe work boots from the time he gets up until he goes to bed.  He does endorse a sensation greater at nighttime.  Denies new or worsening stroke/TIA symptoms.  Virtual visit 03/13/2019: William Patel is being seen today for stroke follow-up.  Residual deficits of dysarthria with mild right hand weakness which has been stable.  Continues on Plavix and atorvastatin for secondary stroke prevention without side effects.  Blood pressure ***.  Denies new or worsening stroke/TIA symptoms.      ROS:   14 system review of systems performed and negative with exception of\ weakness and slurred speech  PMH:  Past Medical History:  Diagnosis Date   Colon cancer ([place])    Left    Generalized headaches    Stroke ([place])    Visual disturbances       PSH:  Past Surgical History:  Procedure Laterality Date   COLON RESECTION  09/21/2011   Procedure: COLON RESECTION LAPAROSCOPIC;  Surgeon: Pedro Earls, MD;  Location: [facility];  Service: General;  Laterality: N/A;      COLONOSCOPY  feb 2013   endo done also    Social History:  Social History   Socioeconomic History   Marital status: Widowed    Spouse name: Not on file   Number of children: Not on file   Years of education: Not on file   Highest education level: Not on file  Occupational History   Not on file  Social Needs   Financial resource strain: Not on file   Food insecurity    Worry: Not on file    Inability: Not on file   Transportation needs    Medical: Not on file    Non-medical: Not on file  Tobacco Use   Smoking status: Former Smoker    Types: Cigars   Smokeless tobacco: Current User    Types: Chew   Tobacco comment: 1 to 2 per month  Substance and Sexual Activity   Alcohol use: Yes    Alcohol/week: 3.0 standard drinks    Types: 3 Cans of beer per week   Drug  use: No   Sexual activity: Not on file  Lifestyle   Physical activity    Days per week: Not on file    Minutes per session: Not on file   Stress: Not on file  Relationships   Social connections    Talks on phone: Not on file    Gets together: Not on file    Attends religious service: Not on file    Active member of club or organization: Not on file    Attends meetings of clubs or organizations: Not on file    Relationship status: Not on file   Intimate partner violence    Fear of current or ex partner: Not on file    Emotionally abused: Not on file    Physically abused: Not on file    Forced sexual activity: Not on file  Other Topics Concern   Not on file  Social History Narrative   Not on file    Family History:  Family History  Problem Relation Age of Onset   Heart failure Mother    Brain cancer Father    Stroke Brother     Medications:    Current Outpatient Medications on File Prior to Visit  Medication Sig Dispense Refill   atorvastatin (LIPITOR) 40 MG tablet Take 1 tablet (40 mg total) by mouth daily. 90 tablet 4   clopidogrel (PLAVIX) 75 MG tablet Take 1 tablet (75 mg total) by mouth daily. 90 tablet 3   No current facility-administered medications on file prior to visit.     Allergies:   Allergies  Allergen Reactions   Tramadol Other (See Comments)    Causes headaches     Physical Exam  There were no vitals filed for this visit. There is no height or weight on file to calculate BMI. No exam data present  General: well developed, pleasant elderly Caucasian male, well nourished, seated, in no evident distress Head: head normocephalic and atraumatic.   Neck: supple with no carotid or supraclavicular bruits Cardiovascular: regular rate and rhythm, no murmurs Musculoskeletal: no deformity Skin:  no rash/petichiae Vascular:  Normal pulses all extremities  Neurologic Exam Mental Status: Awake and fully alert. Moderate dysarthria present oriented to place and time. Recent and remote memory intact. Attention span, concentration and fund of knowledge appropriate. Mood and affect appropriate.  Cranial Nerves: Fundoscopic exam reveals sharp disc margins. Pupils equal, briskly reactive to light. Extraocular movements full without nystagmus. Visual fields full to confrontation. Hearing intact. Facial sensation intact.  Mild right lower facial paralysis Motor: Normal bulk and tone. Normal strength in all tested extremity muscles.  Right hand weak with spasticity Sensory.: intact to touch , pinprick , position and vibratory sensation.  Normal sensation in bilateral lower extremities Coordination: Rapid alternating movements normal in left upper extremity. Finger-to-nose and heel-to-shin performed accurately in left upper extremity and bilateral lower extremities Gait and Station: Arises from chair without difficulty.  Stance is normal. Gait demonstrates normal stride length and balance . Able to heel, toe and tandem walk without difficulty.  Reflexes: 1+ and symmetric. Toes downgoing.     Diagnostic Data (Labs, Imaging, Testing)   Ct Angio Head W Or Wo Contrast Ct Angio Neck W Or Wo Contrast 08/06/2017  CTA NECK:  1. LEFT internal carotid artery 1 cm intimal hematoma approximately 2 cm from the origin with mobile low-density plaque/hematoma in central lumen, at risk for embolization. Approximately 55% stenosis LEFT ICA.  2. Severe bilateral C4-5 neural foraminal narrowing.  CTA  HEAD:  1. No emergent large vessel occlusion. However, paucity of distal LEFT MCA vessels associated with acute small LEFT frontoparietal/MCA territory nonhemorrhagic infarct.   Ct Head Code Stroke Wo Contrast 08/06/2017 IMPRESSION:  1. Disproportionately dense LEFT M2 versus M3 MCA, possible thromboembolism in a background of probable hemoconcentration.  2. ASPECTS is 10.   MRI 1. Moderate acute left perirolandic infarct. Punctate recent infarcts in the left caudate head and left occipital parietal cortex. These could all be explained by the patient's left cervical ICA thrombus. 2. Partial and motion degraded study.  TTE - Left ventricle: The cavity size was normal. Wall thickness was normal. Systolic function was vigorous. The estimated ejection fraction was in the range of 65% to 70%. Wall motion was normal; there were no regional wall motion abnormalities. Doppler parameters are consistent with abnormal left ventricular relaxation (grade 1 diastolic dysfunction). - Aortic valve: There was trivial regurgitation.    ASSESSMENT: William Patel is a 85 y.o. year old male here with left MCA on 08/06/17 secondary to left ICA plaque with intimal hematoma. Vascular risk factors include HLD, HTN and CHF.  Patient is being seen today for follow-up visit and overall is doing well from a stroke standpoint with  residual deficit of moderate dysarthria and right hand weakness with spasticity.  He also endorses new onset of burning sensation on top of feet bilaterally.   PLAN: -Continue clopidogrel 75 mg daily  and Lipitor for secondary stroke prevention -refill for Plavix placed but request future refills by PCP -Bilateral feet burning sensation -localized on top of feet bilaterally without loss of sensation or radiating pain.  As he does wear heavy, steel toe work boots daily this could likely be the cause.  It was advised that he only wear the shoes if needed and to wear lighter shoes while in the house.  He was also advised to continue to monitor his skin along with monitoring for worsening or numbness/tingling. -F/u with PCP regarding your HLD and HTN -F/U with vascular surgery around 08/2018 for repeat carotid duplex -continue to monitor BP at home -Continue to stay active and maintain a healthy diet -Maintain strict control of hypertension with blood pressure goal below 130/90, diabetes with hemoglobin A1c goal below 6.5% and cholesterol with LDL cholesterol (bad cholesterol) goal below 70 mg/dL. I also advised the patient to eat a healthy diet with plenty of whole grains, cereals, fruits and vegetables, exercise regularly and maintain ideal body weight.  Follow up in 6 months or call earlier if needed  Greater than 50% time during this 25 minute consultation visit was spent on counseling and coordination of care about HLD, and HTN, discussion about risk benefit of anticoagulation and answering questions.   Frann Rider, AGNP-BC  [facility] [address] [place], [place] [address]  Phone [phone] Fax [phone]

## 2019-03-21 ENCOUNTER — Other Ambulatory Visit: Payer: Self-pay

## 2019-03-21 DIAGNOSIS — I6522 Occlusion and stenosis of left carotid artery: Secondary | ICD-10-CM

## 2019-03-22 ENCOUNTER — Other Ambulatory Visit: Payer: Self-pay

## 2019-03-22 ENCOUNTER — Encounter: Payer: Self-pay | Admitting: Vascular Surgery

## 2019-03-22 ENCOUNTER — Ambulatory Visit (INDEPENDENT_AMBULATORY_CARE_PROVIDER_SITE_OTHER): Payer: PPO | Admitting: Vascular Surgery

## 2019-03-22 ENCOUNTER — Ambulatory Visit (HOSPITAL_COMMUNITY)
Admission: RE | Admit: 2019-03-22 | Discharge: 2019-03-22 | Disposition: A | Discharge status: Home / Self Care | Payer: PPO | Source: Ambulatory Visit | Attending: Family | Admitting: Family

## 2019-03-22 VITALS — BP 117/75 | HR 70 | Temp 97.9°F | Resp 20 | Ht 62.0 in | Wt 128.0 lb

## 2019-03-22 DIAGNOSIS — I6522 Occlusion and stenosis of left carotid artery: Secondary | ICD-10-CM | POA: Insufficient documentation

## 2019-03-22 NOTE — Progress Notes (Signed)
Patient name: William Patel MRN: [id number] DOB: 1934-03-27 Sex: male  REASON FOR VISIT:   Follow-up of left carotid stenosis.  HPI:   William Patel is a pleasant 85 y.o. male who I saw in consultation in June 2019 with a left carotid stenosis.  It was felt that his stroke was likely embolic to the middle cerebral artery on the left.  He had a 55% internal carotid artery stenosis.  He was on maximal medical therapy and he came in for follow-up duplex scan in October which showed that on the left side, which was the side of concern, there was a less than 39% stenosis.  I recommended a follow-up duplex scan in 1 year.  Since I saw him last, he denies any new neurologic symptoms.  He does have persistent expressive aphasia and right upper extremity weakness.  His right upper extremity weakness has improved some.  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  He is on Plavix and is on a statin.  He is not a smoker.  Past Medical History:  Diagnosis Date  . Colon cancer ([place])    Left   . Generalized headaches   . Stroke ([place])   . Visual disturbances     Family History  Problem Relation Age of Onset  . Heart failure Mother   . Brain cancer Father   . Stroke Brother     SOCIAL HISTORY: Social History   Tobacco Use  . Smoking status: Former Smoker    Types: Cigars  . Smokeless tobacco: Current User    Types: Chew  . Tobacco comment: 1 to 2 per month  Substance Use Topics  . Alcohol use: Yes    Alcohol/week: 3.0 standard drinks    Types: 3 Cans of beer per week    Allergies  Allergen Reactions  . Tramadol Other (See Comments)    Causes headaches    Current Outpatient Medications  Medication Sig Dispense Refill  . atorvastatin (LIPITOR) 40 MG tablet Take 1 tablet (40 mg total) by mouth daily. 90 tablet 4  . clopidogrel (PLAVIX) 75 MG tablet Take 1 tablet (75 mg total) by mouth daily. 90 tablet 3  . meclizine (ANTIVERT) 25 MG tablet  TAKE 1 TABLET BY MOUTH EVERY 6 HOURS AS NEEDED FOR VERTIGO     No current facility-administered medications for this visit.     REVIEW OF SYSTEMS:  [X]  denotes positive finding, [ ]  denotes negative finding Cardiac  Comments:  Chest pain or chest pressure:    Shortness of breath upon exertion:    Short of breath when lying flat:    Irregular heart rhythm:        Vascular    Pain in calf, thigh, or hip brought on by ambulation: x   Pain in feet at night that wakes you up from your sleep:     Blood clot in your veins:    Leg swelling:         Pulmonary    Oxygen at home:    Productive cough:     Wheezing:         Neurologic    Sudden weakness in arms or legs:     Sudden numbness in arms or legs:     Sudden onset of difficulty speaking or slurred speech:    Temporary loss of vision in one eye:     Problems with dizziness:  Gastrointestinal    Blood in stool:     Vomited blood:         Genitourinary    Burning when urinating:     Blood in urine:        Psychiatric    Major depression:         Hematologic    Bleeding problems:    Problems with blood clotting too easily:        Skin    Rashes or ulcers:        Constitutional    Fever or chills:     PHYSICAL EXAM:   Vitals:   03/22/19 1527 03/22/19 1528  BP: 114/70 117/75  Pulse: 70   Resp: 20   Temp: 97.9 F (36.6 C)   SpO2: 96%   Weight: 128 lb (58.1 kg)   Height: 5\' 2"  (1.575 m)     GENERAL: The patient is a well-nourished male, in no acute distress. The vital signs are documented above. CARDIAC: There is a regular rate and rhythm.  VASCULAR: I do not detect carotid bruits. PULMONARY: There is good air exchange bilaterally without wheezing or rales. ABDOMEN: Soft and non-tender with normal pitched bowel sounds.  MUSCULOSKELETAL: There are no major deformities or cyanosis. NEUROLOGIC: He has persistent right upper extremity weakness.  He has good strength in his lower extremities.  He has an  expressive aphasia. SKIN: There are no ulcers or rashes noted. PSYCHIATRIC: The patient has a normal affect.  DATA:    CAROTID DUPLEX: I have independently interpreted his carotid duplex scan.  On the right side he has a less than 39% stenosis.  The right vertebral artery is patent with antegrade flow.  On the left side he has a less than 39% stenosis.  The left vertebral artery is patent with antegrade flow.  MEDICAL ISSUES:   HISTORY OF LEFT BRAIN STROKE: This patient had an embolic stroke on the left and was found to have a moderate left carotid stenosis.  However on his follow-up duplex scans he has had minimal disease on the left.  He continues maximal medical therapy.  Given that he has no significant carotid disease bilaterally, I think that it would be reasonable to stretch his follow-up out to 2 years.  I ordered a follow-up carotid duplex scan in 2 years and I will see him back at that time.  He knows to call sooner if he has problems.  He will remain on his Plavix and statin.  Deitra Mayo [facility] [phone]

## 2019-03-23 ENCOUNTER — Other Ambulatory Visit: Payer: Self-pay

## 2019-03-23 DIAGNOSIS — I6522 Occlusion and stenosis of left carotid artery: Secondary | ICD-10-CM

## 2019-12-24 IMAGING — CT CT HEAD CODE STROKE
3 series · 15 of 47 positions shown, 18 images · non-contrast
Comparison: CT HEAD March 27, 2016

CLINICAL DATA: Code stroke. Altered level of consciousness. History
of colon cancer, vertigo.

EXAM:
CT HEAD WITHOUT CONTRAST
TECHNIQUE: Contiguous axial images were obtained from the base of the skull
through the vertex without intravenous contrast.

[Series 3: head 5.0 st · axial · 0.43mm/px · z∈[-130,+20]mm · 9 of 36 slices shown, 12 images]
[im 3/36  brain]
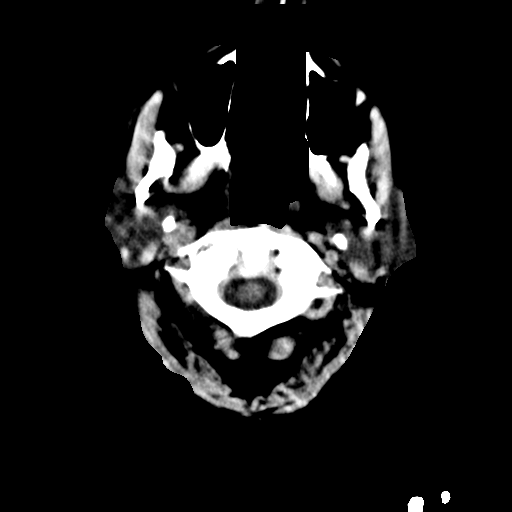
[im 3/36  bone]
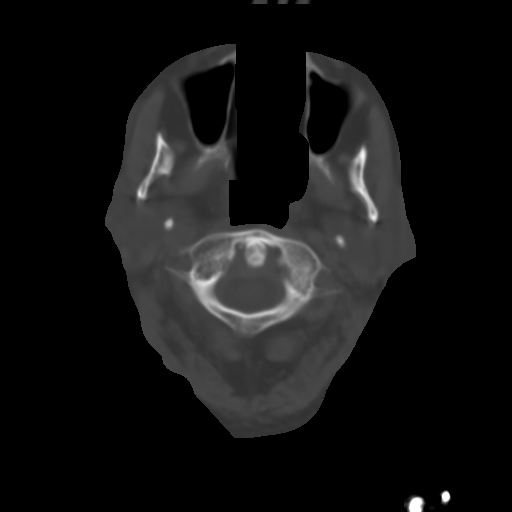
[im 7/36  brain]
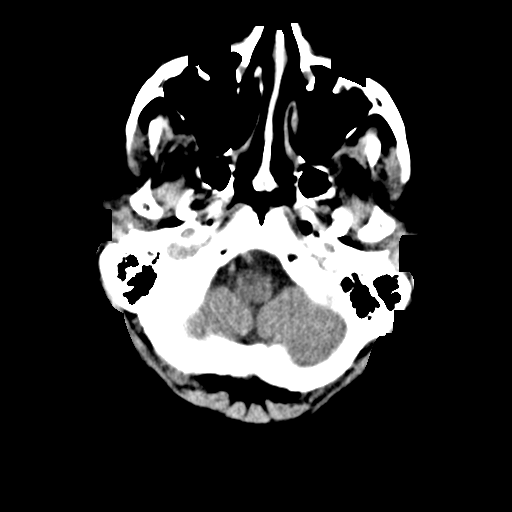
[im 10/36  brain]
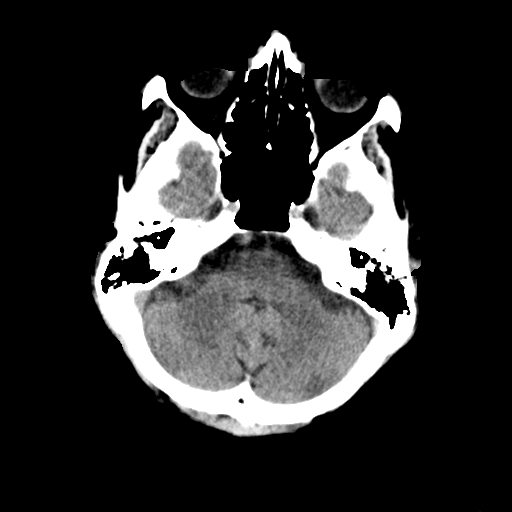
[im 14/36  brain]
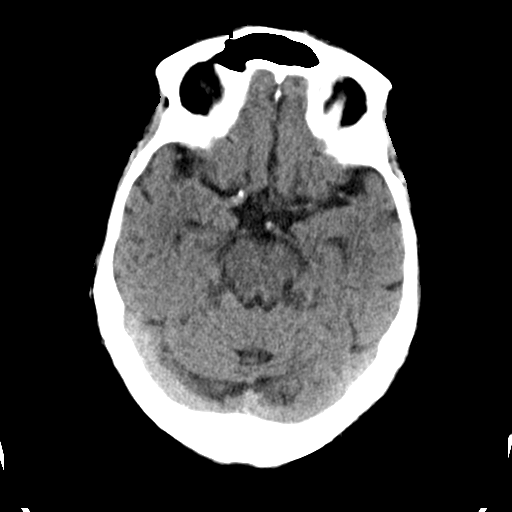
[im 19/36  brain]
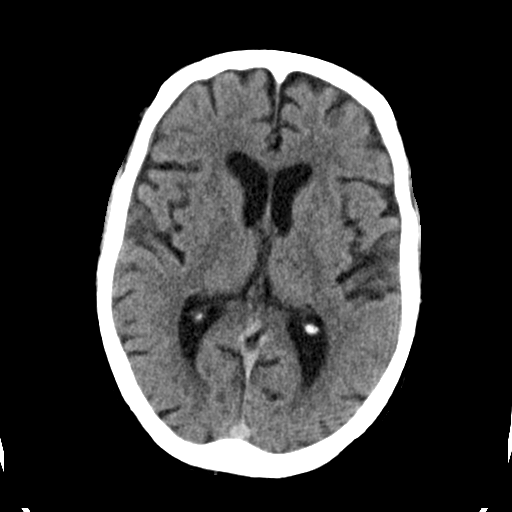
[im 19/36  bone]
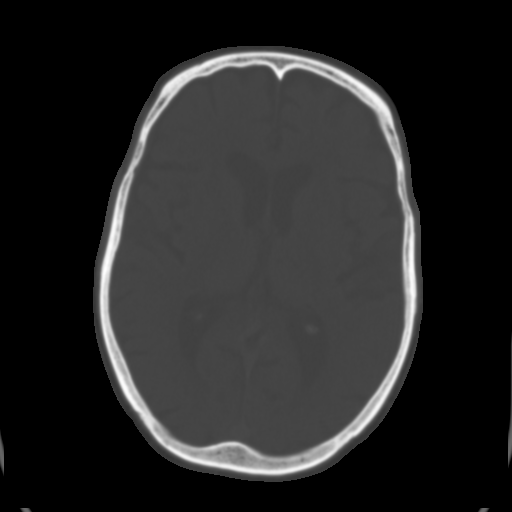
[im 22/36  brain]
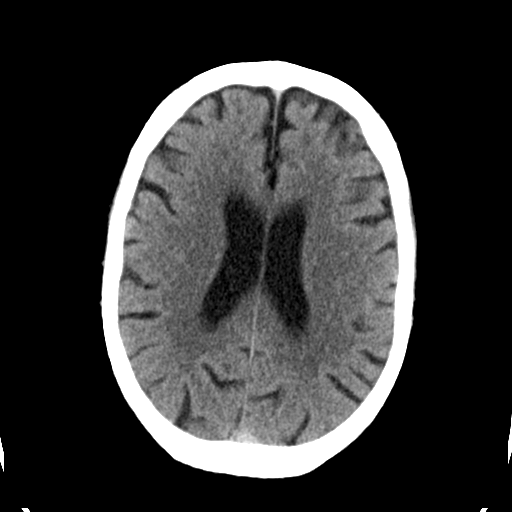
[im 26/36  brain]
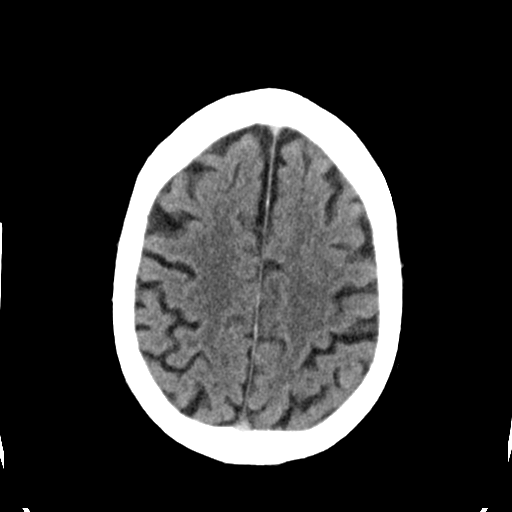
[im 29/36  brain]
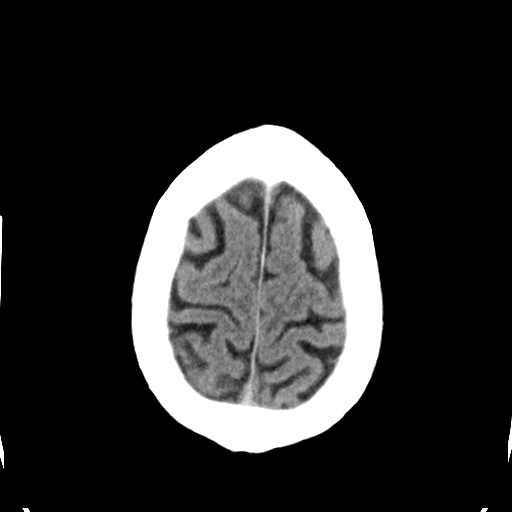
[im 33/36  brain]
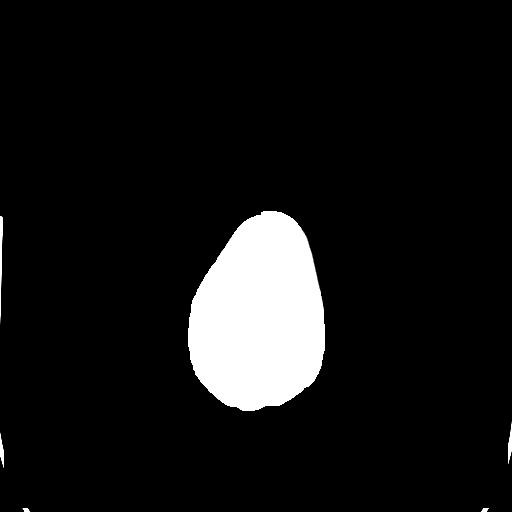
[im 33/36  bone]
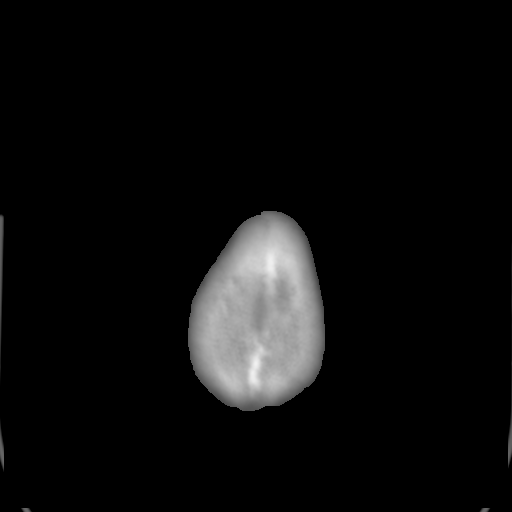

[Series 5: head 3.0 cor st · coronal · 0.35mm/px · 3 of 71 slices shown]
[im 24/71  brain]
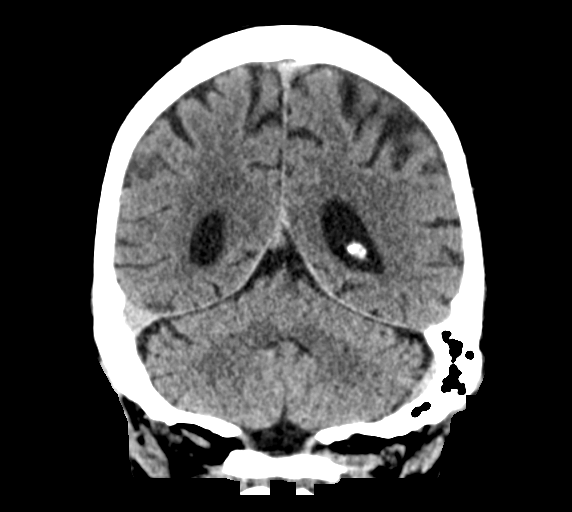
[im 32/71  brain]
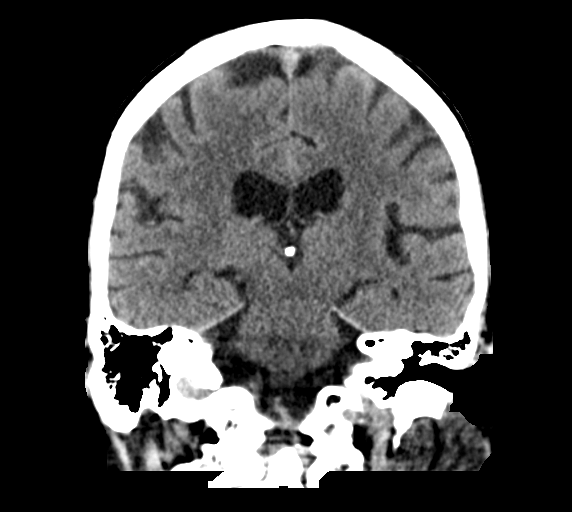
[im 39/71  brain]
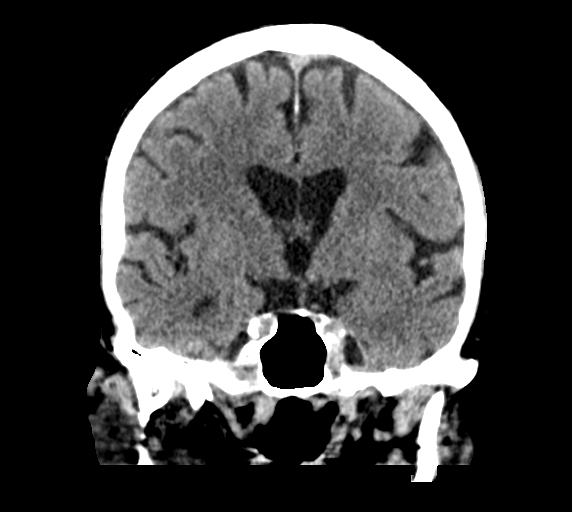

[Series 6: head 3.0 sag st · sagittal · 0.35mm/px · 3 of 59 slices shown]
[im 20/59  brain]
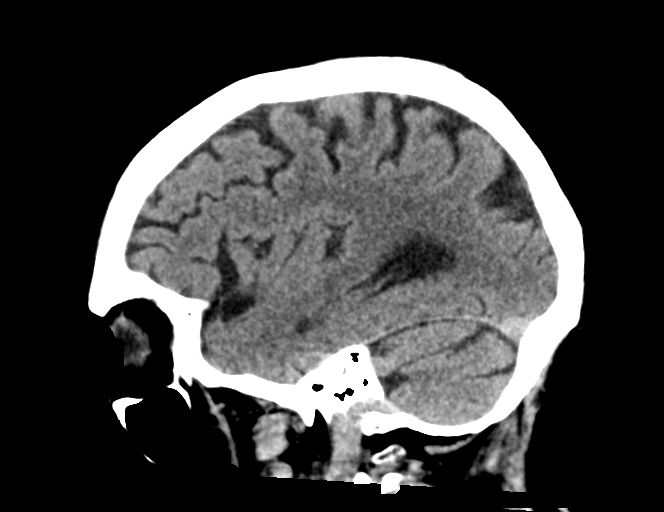
[im 30/59  brain]
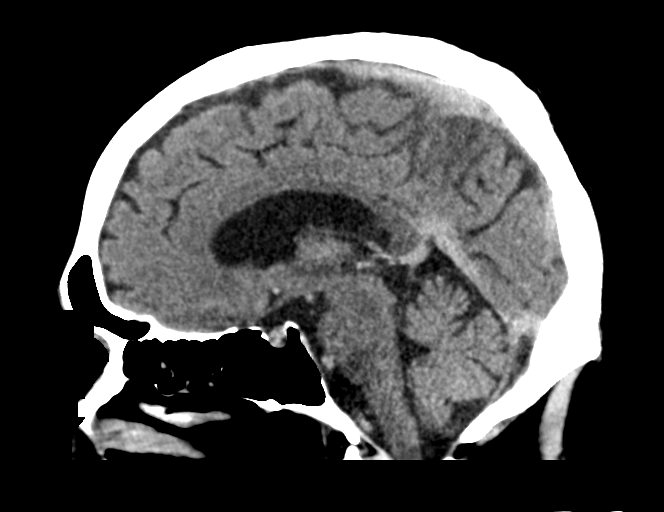
[im 39/59  brain]
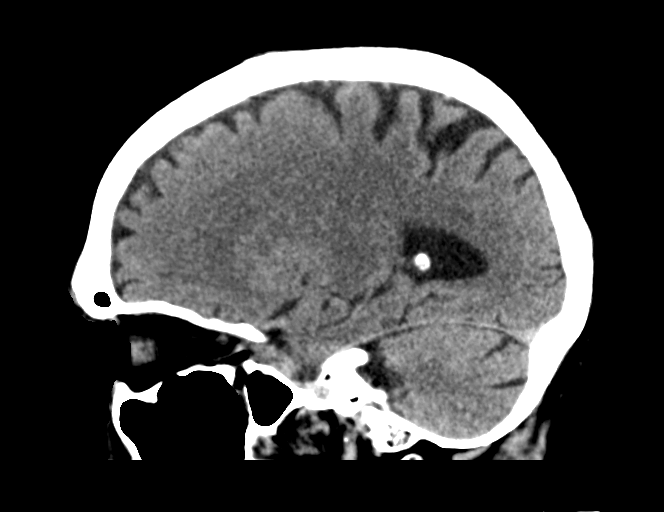

[15 of 47 positions shown; findings below may reference images not displayed]

FINDINGS: Mild motion degraded examination.

BRAIN: No intraparenchymal hemorrhage, mass effect nor midline
shift. The ventricles and sulci are normal for age. Patchy
supratentorial white matter hypodensities less than expected for
patient's age, though non-specific are most compatible with chronic
small vessel ischemic disease. RIGHT inferior basal ganglia
unchanged perivascular space. No acute large vascular territory
infarcts. No abnormal extra-axial fluid collections. Basal cisterns
are patent.

VASCULAR: Mild calcific atherosclerosis of the carotid siphons.
Mildly dense intracranial vessels seen with hemoconcentration.
However, slight lead disproportionate lead dense LEFT insular MCA.

SKULL: No skull fracture. No significant scalp soft tissue swelling.

SINUSES/ORBITS: The mastoid air-cells and included paranasal sinuses
are well-aerated.The included ocular globes and orbital contents are
non-suspicious.

OTHER: None.

ASPECTS (Alberta Stroke Program Early CT Score)

- Ganglionic level infarction (caudate, lentiform nuclei, internal
capsule, insula, M1-M3 cortex): 7

- Supraganglionic infarction (M4-M6 cortex): 3

Total score (0-10 with 10 being normal): 10
IMPRESSION: 1. Disproportionately dense LEFT M2 versus M3 MCA, possible
thromboembolism in a background of probable hemoconcentration.
2. ASPECTS is 10.
3. Critical Value/emergent results were called by telephone at the
time of interpretation on 08/06/2017 at [DATE] to Dr. Therrien,
Neurology, who verbally acknowledged these results.

## 2019-12-25 IMAGING — RF DG SWALLOWING FUNCTION - NRPT [facility]
1 series · 18 of 24 positions shown · non-contrast
Comparison: none

[Series 1: run · 31 acquisitions, 18 frames shown]
[im 1/31]
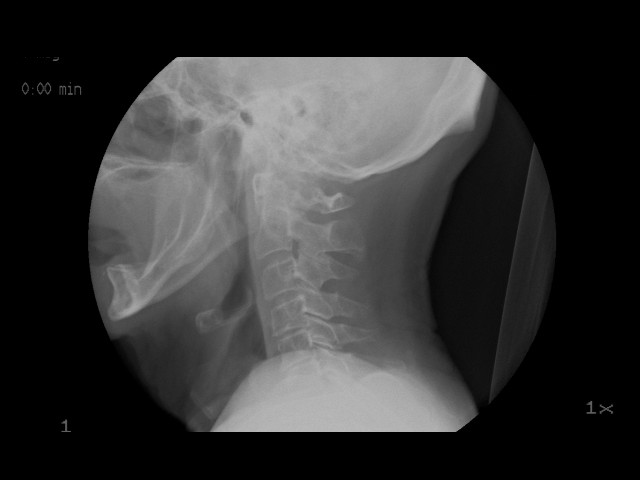
[im 3/31]
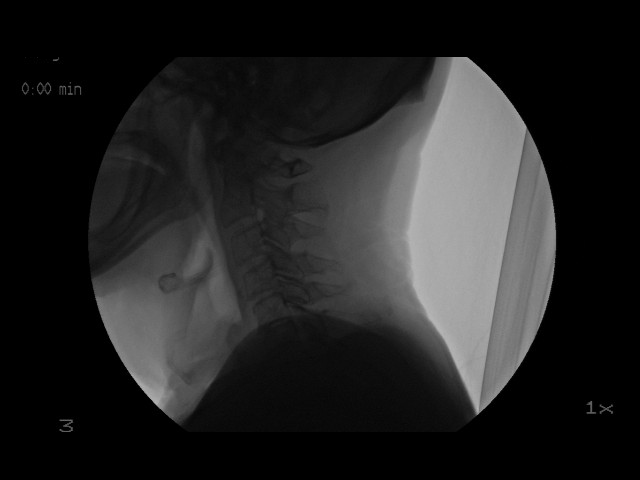
[im 4/31]
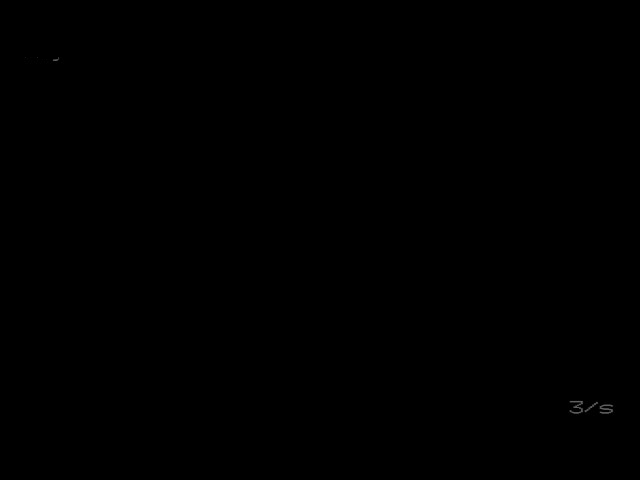
[im 6/31]
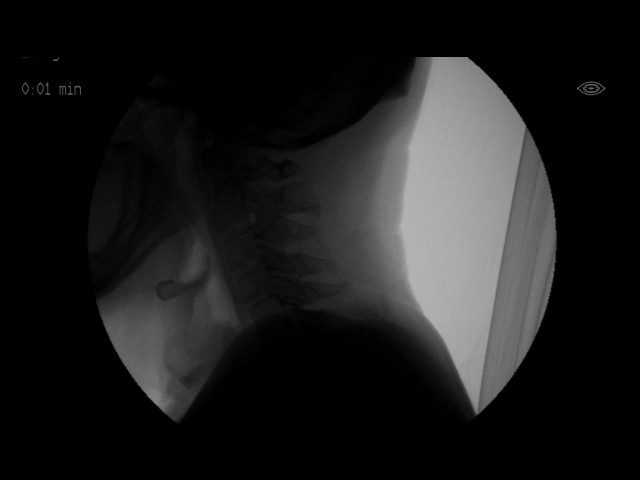
[im 8/31]
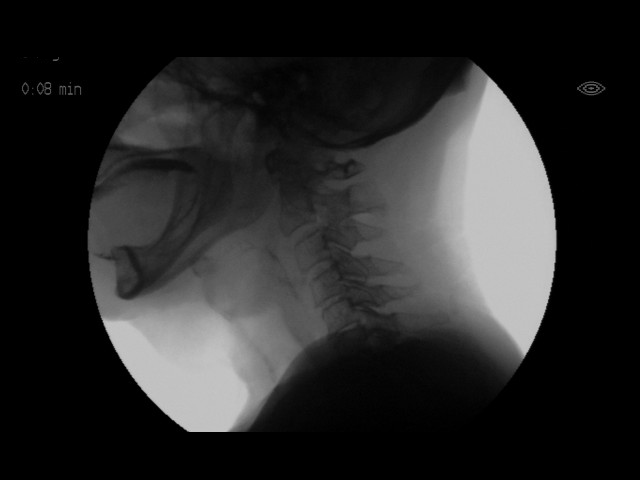
[im 10/31]
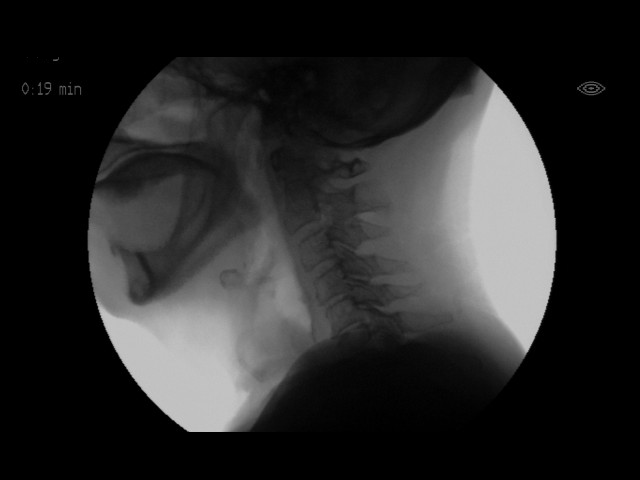
[im 11/31]
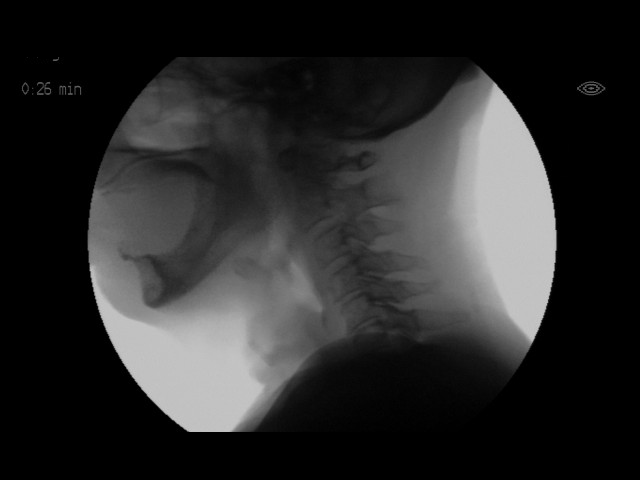
[im 14/31]
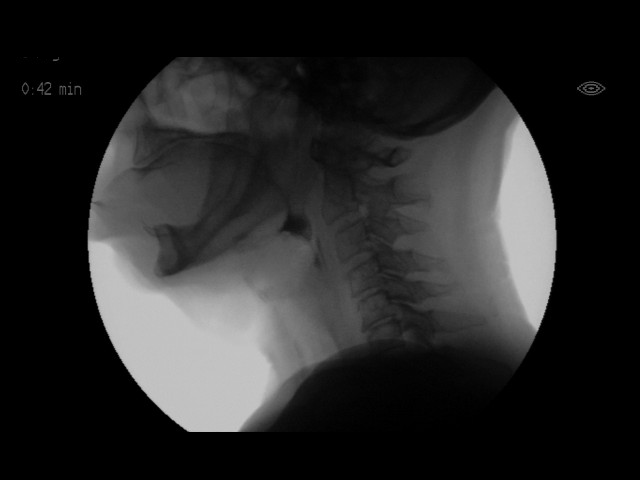
[im 15/31]
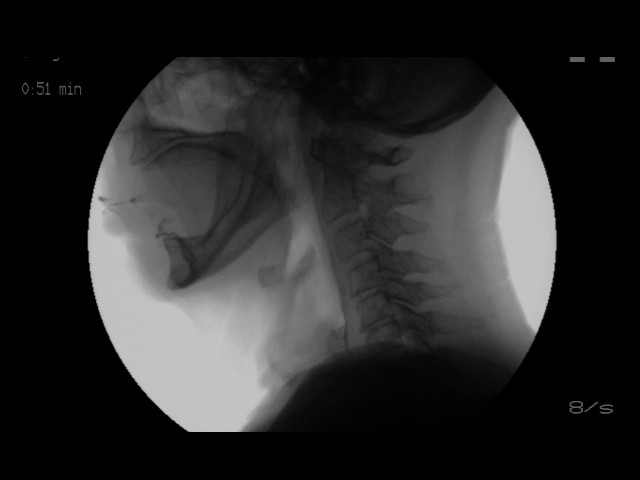
[im 16/31]
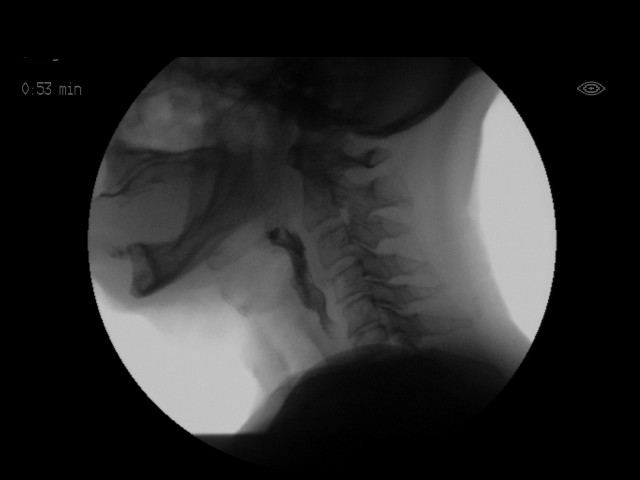
[im 19/31]
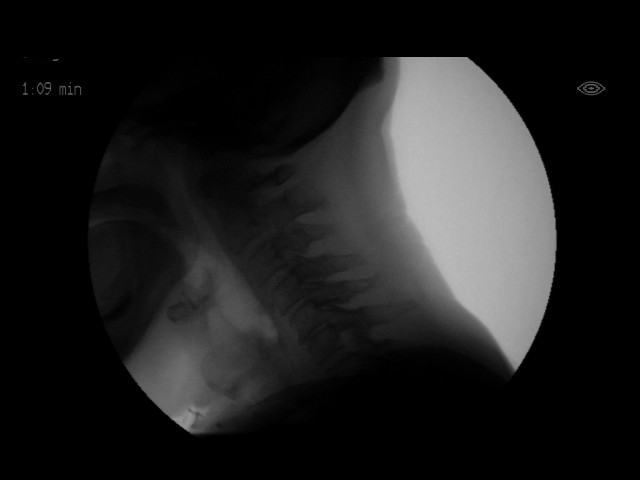
[im 20/31]
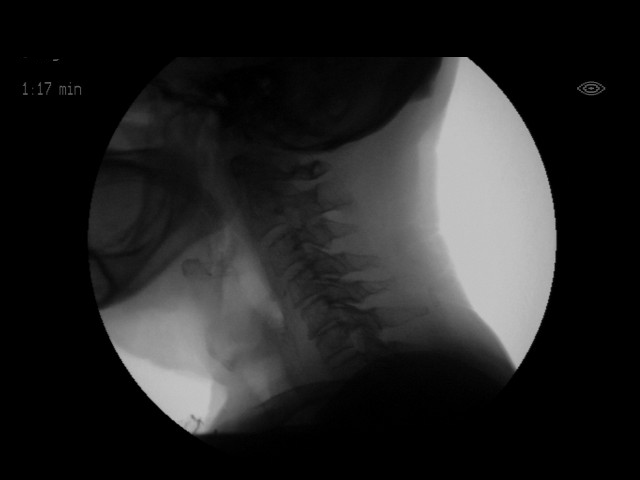
[im 21/31]
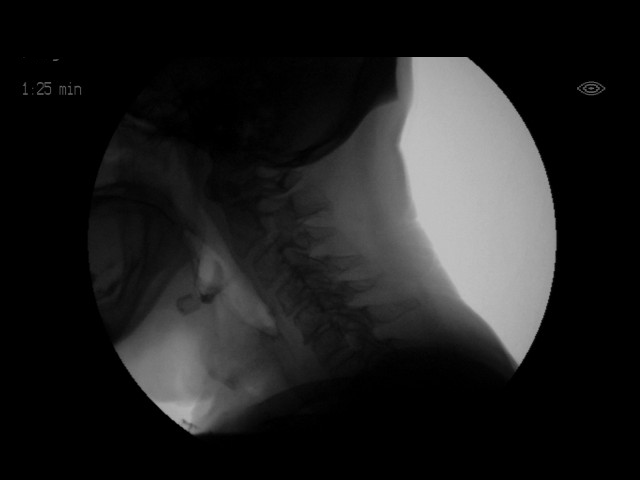
[im 24/31]
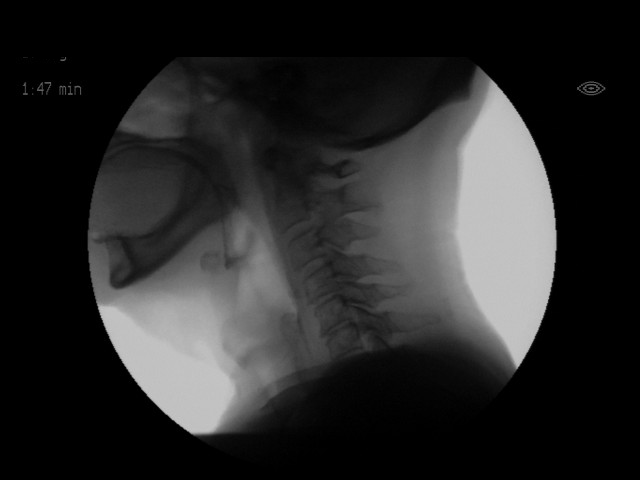
[im 25/31]
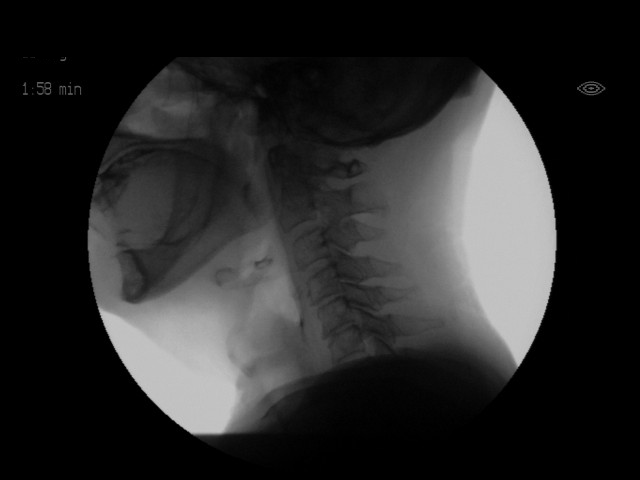
[im 27/31]
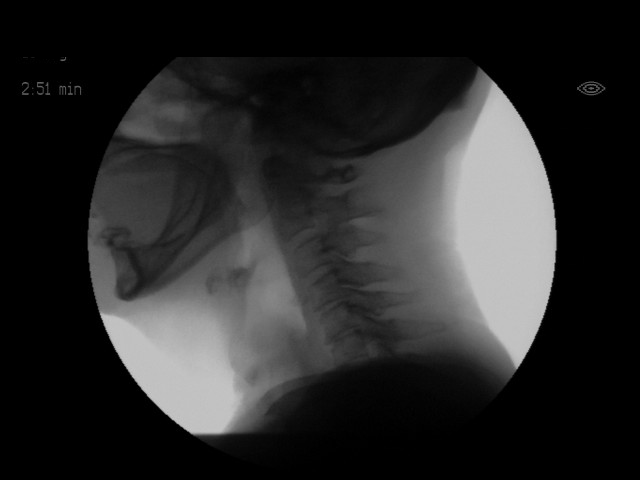
[im 29/31]
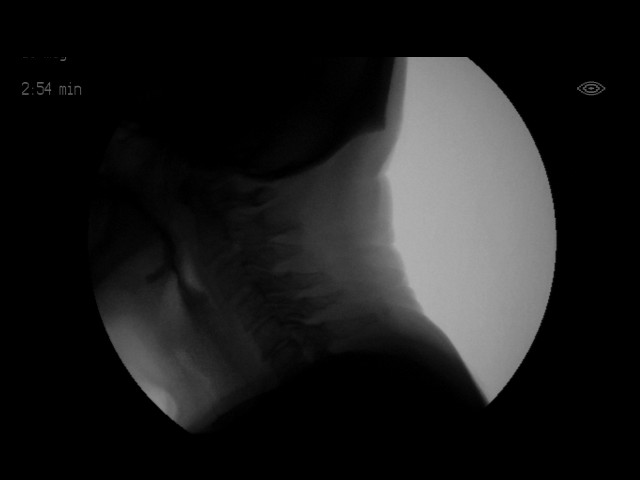
[im 31/31]
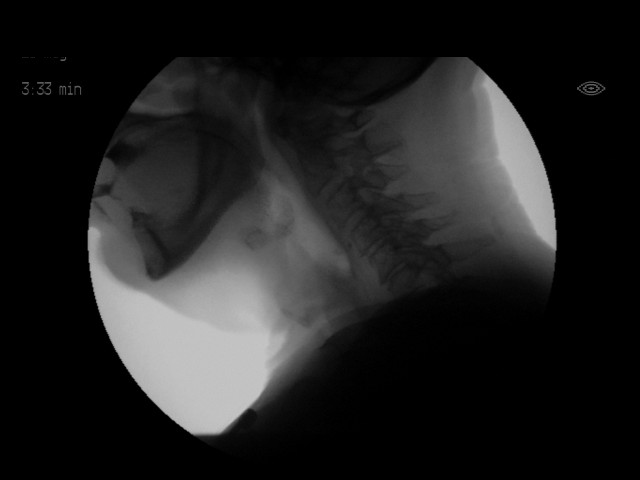

[18 of 24 positions shown; findings below may reference images not displayed]

FLUOROSCOPY FOR SWALLOWING FUNCTION STUDY:
Fluoroscopy was provided for swallowing function study, which was administered by a speech pathologist.  Final results and recommendations from this study are contained within the speech pathology report.

## 2020-09-20 ENCOUNTER — Emergency Department (HOSPITAL_COMMUNITY)
Admission: EM | Admit: 2020-09-20 | Discharge: 2020-09-20 | Disposition: A | Discharge status: Home / Self Care | Payer: Medicare (Managed Care) | Attending: Emergency Medicine | Admitting: Emergency Medicine

## 2020-09-20 ENCOUNTER — Encounter (HOSPITAL_COMMUNITY): Payer: Self-pay | Admitting: Emergency Medicine

## 2020-09-20 ENCOUNTER — Emergency Department (HOSPITAL_COMMUNITY): Payer: Medicare (Managed Care)

## 2020-09-20 DIAGNOSIS — J069 Acute upper respiratory infection, unspecified: Secondary | ICD-10-CM | POA: Insufficient documentation

## 2020-09-20 DIAGNOSIS — Z87891 Personal history of nicotine dependence: Secondary | ICD-10-CM | POA: Insufficient documentation

## 2020-09-20 DIAGNOSIS — N182 Chronic kidney disease, stage 2 (mild): Secondary | ICD-10-CM | POA: Diagnosis not present

## 2020-09-20 DIAGNOSIS — I5032 Chronic diastolic (congestive) heart failure: Secondary | ICD-10-CM | POA: Diagnosis not present

## 2020-09-20 DIAGNOSIS — I130 Hypertensive heart and chronic kidney disease with heart failure and stage 1 through stage 4 chronic kidney disease, or unspecified chronic kidney disease: Secondary | ICD-10-CM | POA: Diagnosis not present

## 2020-09-20 DIAGNOSIS — Z20822 Contact with and (suspected) exposure to covid-19: Secondary | ICD-10-CM | POA: Insufficient documentation

## 2020-09-20 DIAGNOSIS — J340 Abscess, furuncle and carbuncle of nose: Secondary | ICD-10-CM | POA: Diagnosis not present

## 2020-09-20 DIAGNOSIS — R531 Weakness: Secondary | ICD-10-CM | POA: Diagnosis not present

## 2020-09-20 DIAGNOSIS — Z85038 Personal history of other malignant neoplasm of large intestine: Secondary | ICD-10-CM | POA: Insufficient documentation

## 2020-09-20 DIAGNOSIS — R059 Cough, unspecified: Secondary | ICD-10-CM | POA: Diagnosis present

## 2020-09-20 LAB — CBC WITH DIFFERENTIAL/PLATELET
Abs Immature Granulocytes: 0.04 10*3/uL (ref 0.00–0.07)
Basophils Absolute: 0 10*3/uL (ref 0.0–0.1)
Basophils Relative: 0 %
Eosinophils Absolute: 0.1 10*3/uL (ref 0.0–0.5)
Eosinophils Relative: 1 %
HCT: 42.6 % (ref 39.0–52.0)
Hemoglobin: 14.1 g/dL (ref 13.0–17.0)
Immature Granulocytes: 0 %
Lymphocytes Relative: 11 %
Lymphs Abs: 1.8 10*3/uL (ref 0.7–4.0)
MCH: 31 pg (ref 26.0–34.0)
MCHC: 33.1 g/dL (ref 30.0–36.0)
MCV: 93.6 fL (ref 80.0–100.0)
Monocytes Absolute: 1.2 10*3/uL — ABNORMAL HIGH (ref 0.1–1.0)
Monocytes Relative: 8 %
Neutro Abs: 12.3 10*3/uL — ABNORMAL HIGH (ref 1.7–7.7)
Neutrophils Relative %: 80 %
Platelets: 151 10*3/uL (ref 150–400)
RBC: 4.55 MIL/uL (ref 4.22–5.81)
RDW: 12.9 % (ref 11.5–15.5)
WBC: 15.4 10*3/uL — ABNORMAL HIGH (ref 4.0–10.5)
nRBC: 0 % (ref 0.0–0.2)

## 2020-09-20 LAB — RESP PANEL BY RT-PCR (FLU A&B, COVID) ARPGX2
Influenza A by PCR: NEGATIVE
Influenza B by PCR: NEGATIVE
SARS Coronavirus 2 by RT PCR: NEGATIVE

## 2020-09-20 LAB — BASIC METABOLIC PANEL
Anion gap: 9 (ref 5–15)
BUN: 27 mg/dL — ABNORMAL HIGH (ref 8–23)
CO2: 23 mmol/L (ref 22–32)
Calcium: 9.9 mg/dL (ref 8.9–10.3)
Chloride: 105 mmol/L (ref 98–111)
Creatinine, Ser: 1.37 mg/dL — ABNORMAL HIGH (ref 0.61–1.24)
GFR, Estimated: 51 mL/min — ABNORMAL LOW (ref >60)
Glucose, Bld: 107 mg/dL — ABNORMAL HIGH (ref 70–99)
Potassium: 3.7 mmol/L (ref 3.5–5.1)
Sodium: 137 mmol/L (ref 135–145)

## 2020-09-20 MED ORDER — ACETAMINOPHEN 325 MG PO TABS
650.0000 mg | ORAL_TABLET | Freq: Once | ORAL | Status: AC
  Administered 2020-09-20: 650 mg via ORAL
  Filled 2020-09-20: qty 2

## 2020-09-20 MED ORDER — DOXYCYCLINE HYCLATE 100 MG PO CAPS: 100.0000 mg | ORAL_CAPSULE | Freq: Two times a day (BID) | ORAL | 0 refills | Status: AC

## 2020-09-20 MED ORDER — BUPIVACAINE HCL (PF) 0.5 % IJ SOLN
10.0000 mL | Freq: Once | INTRAMUSCULAR | Status: AC
  Administered 2020-09-20: 10 mL
  Filled 2020-09-20: qty 30

## 2020-09-20 NOTE — ED Provider Notes (Signed)
[facility] DEPT Provider Note   CSN: [id number] Arrival date & time: 09/20/20  [id number]     History Chief Complaint  Patient presents with  . Cough  . Nasal Congestion    William Patel is a 87 y.o. male.  HPI Patient is an 87 year old male with past medical history significant for stroke with right-sided deficits, lung cancer s/p resection, carotid artery stenosis, CKD, HTN, asymptomatic CHF, chronic tobacco use  Patient is presented to the ER today with 2 days of mild cough with clear/white sputum production, sinus congestion, sinus pressure.  He states that he has no nausea or vomiting.  He states that he is primarily complaining of significant nose pain and irritation.  He states that this is made worse by the rhinorrhea that he is experiencing.  He states that his nasal irritation is constant worse with wiping his nose.  Patient states that he is vaccinated for COVID.  Denies any known sick contacts.  Denies any fevers or chills.  Denies any chest pain or shortness of breath.  He states that he is still able to get around his house and do his activities of daily living.    Past Medical History:  Diagnosis Date  . Colon cancer ([place])    Left   . Generalized headaches   . Stroke ([place])   . Visual disturbances     Patient Active Problem List   Diagnosis Date Noted  . Carotid stenosis 08/08/2017  . Smoker 08/08/2017  . HLD (hyperlipidemia) 08/08/2017  . Stroke (cerebrum) ([place]) 08/06/2017  . AKI (acute kidney injury) ([place]) 03/27/2016  . CKD (chronic kidney disease), stage II 03/27/2016  . Hypertension 03/27/2016  . Chronic diastolic CHF (congestive heart failure) ([place]) 03/27/2016  . Chest pain, midsternal 06/14/2012  . Abdominal pain- LLQ 06/14/2012  . Near syncope 06/14/2012  . Vertigo 06/14/2012  . Diverticulitis of sigmoid colon 06/14/2012  . PVC (premature ventricular contraction) 08/13/2011  . Pre-operative cardiovascular examination  08/13/2011  . Colon cancer-proximal descending left colon 08/06/2011  . Tobacco use-chews 08/06/2011    Past Surgical History:  Procedure Laterality Date  . COLON RESECTION  09/21/2011   Procedure: COLON RESECTION LAPAROSCOPIC;  Surgeon: Pedro Earls, MD;  Location: [facility];  Service: General;  Laterality: N/A;     . COLONOSCOPY  feb 2013   endo done also       Family History  Problem Relation Age of Onset  . Heart failure Mother   . Brain cancer Father   . Stroke Brother     Social History   Tobacco Use  . Smoking status: Former Smoker    Types: Cigars  . Smokeless tobacco: Current User    Types: Chew  . Tobacco comment: 1 to 2 per month  Vaping Use  . Vaping Use: Never used  Substance Use Topics  . Alcohol use: Yes    Alcohol/week: 3.0 standard drinks    Types: 3 Cans of beer per week  . Drug use: No    Home Medications Prior to Admission medications   Medication Sig Start Date End Date Taking? Authorizing Provider  acetaminophen (TYLENOL) 325 MG tablet Take 650 mg by mouth every 4 (four) hours as needed for mild pain, fever or headache.   Yes [provider]  atorvastatin (LIPITOR) 40 MG tablet Take 1 tablet (40 mg total) by mouth daily. 12/21/17  Yes McCue, Janett Billow, NP  clobetasol cream (TEMOVATE) [id number] % Apply 1 application topically 2 (two)  times daily. Breaking out in corners of mouth 08/30/20  Yes [provider]  clopidogrel (PLAVIX) 75 MG tablet Take 1 tablet (75 mg total) by mouth daily. 06/23/18  Yes McCue, Janett Billow, NP  doxycycline (VIBRAMYCIN) 100 MG capsule Take 1 capsule (100 mg total) by mouth 2 (two) times daily for 7 days. 09/20/20 09/27/20 Yes Dejuan Elman, Kathleene Hazel, PA  meclizine (ANTIVERT) 12.5 MG tablet Take 12.5 mg by mouth 3 (three) times daily as needed (vertigo).   Yes [provider]  OVER THE COUNTER MEDICATION Take 1 tablet by mouth daily.   Yes [provider]    Allergies    Tramadol  Review of Systems    Review of Systems  Constitutional: Negative for chills and fever.  HENT: Positive for congestion.        Nose irritation  Eyes: Negative for pain.  Respiratory: Positive for cough. Negative for shortness of breath.   Cardiovascular: Negative for chest pain and leg swelling.  Gastrointestinal: Negative for abdominal pain, diarrhea, nausea and vomiting.  Genitourinary: Negative for dysuria.  Musculoskeletal: Negative for myalgias.  Skin: Negative for rash.  Neurological: Negative for dizziness and headaches.    Physical Exam Updated Vital Signs BP (!) 169/94   Pulse 85   Temp 97.9 F (36.6 C) (Oral)   Resp (!) 23   Ht 5\' 2"  (1.575 m)   Wt 56.2 kg   SpO2 96%   BMI 22.68 kg/m   Physical Exam Vitals and nursing note reviewed.  Constitutional:      General: He is not in acute distress. HENT:     Head: Normocephalic and atraumatic.     Nose:     Comments: Nasal septum is mildly edematous with irritation  Some fluctuance of the top of the frenulum Eyes:     General: No scleral icterus. Cardiovascular:     Rate and Rhythm: Normal rate and regular rhythm.     Pulses: Normal pulses.     Heart sounds: Normal heart sounds.  Pulmonary:     Effort: Pulmonary effort is normal. No respiratory distress.     Breath sounds: No wheezing.     Comments: Normal effort, shallow tidal volume which seems to be chronic  Speaking full sentences garbled by dysarthria Abdominal:     Palpations: Abdomen is soft.     Tenderness: There is no abdominal tenderness. There is no right CVA tenderness, left CVA tenderness, guarding or rebound.  Musculoskeletal:     Cervical back: Normal range of motion.     Right lower leg: No edema.     Left lower leg: No edema.  Skin:    General: Skin is warm and dry.     Capillary Refill: Capillary refill takes less than 2 seconds.  Neurological:     Mental Status: He is alert. Mental status is at baseline.     Comments: Right-sided weakness consistent with  patient's chronic deficits  Dysarthria   Psychiatric:        Mood and Affect: Mood normal.        Behavior: Behavior normal.     ED Results / Procedures / Treatments   Labs (all labs ordered are listed, but only abnormal results are displayed) Labs Reviewed  CBC WITH DIFFERENTIAL/PLATELET - Abnormal; Notable for the following components:      Result Value   WBC 15.4 (*)    Neutro Abs 12.3 (*)    Monocytes Absolute 1.2 (*)    All other components within  normal limits  BASIC METABOLIC PANEL - Abnormal; Notable for the following components:   Glucose, Bld 107 (*)    BUN 27 (*)    Creatinine, Ser 1.37 (*)    GFR, Estimated 51 (*)    All other components within normal limits  RESP PANEL BY RT-PCR (FLU A&B, COVID) [id number]    EKG None  Radiology DG Chest Portable 1 View  Result Date: 09/20/2020 CLINICAL DATA:  Cough, shortness of breath EXAM: PORTABLE CHEST 1 VIEW COMPARISON:  2015 FINDINGS: Shallow inspiration with low lung volumes. No consolidation or edema. No pleural effusion or pneumothorax. Cardiomediastinal contours are within normal limits for technique. IMPRESSION: No acute process in the chest. Electronically Signed   By: Macy Mis M.D.   On: 09/20/2020 11:16    Procedures .Marland KitchenIncision and Drainage  Date/Time: 09/20/2020 3:02 PM Performed by: Tedd Sias, PA Authorized by: Tedd Sias, PA   Consent:    Consent obtained:  Verbal   Consent given by:  Patient   Risks discussed:  Bleeding, incomplete drainage, pain and damage to other organs   Alternatives discussed:  No treatment Universal protocol:    Procedure explained and questions answered to patient or proxy's satisfaction: yes     Relevant documents present and verified: yes     Test results available : yes     Imaging studies available: yes     Required blood products, implants, devices, and special equipment available: yes     Site/side marked: yes     Immediately prior to procedure, a time  out was called: yes     Patient identity confirmed:  Verbally with patient Location:    Type:  Abscess   Size:  1cm x 1cm   Location: nose, just above lip Philtrum. Pre-procedure details:    Skin preparation:  Betadine Anesthesia:    Anesthesia method:  Local infiltration   Local anesthetic:  Bupivacaine 0.5% WITH epi Procedure type:    Complexity:  Simple Procedure details:    Incision types:  Single straight   Incision depth:  Subcutaneous   Wound management:  Probed and deloculated   Drainage:  Bloody   Drainage amount:  Scant   Wound treatment:  Wound left open Post-procedure details:    Procedure completion:  Tolerated well, no immediate complications     Medications Ordered in ED Medications  bupivacaine (MARCAINE) 0.5 % injection 10 mL (10 mLs Infiltration Given by Other 09/20/20 1427)  acetaminophen (TYLENOL) tablet 650 mg (650 mg Oral Given 09/20/20 1339)    ED Course  I have reviewed the triage vital signs and the nursing notes.  Pertinent labs & imaging results that were available during my care of the patient were reviewed by me and considered in my medical decision making (see chart for details).    Clinical Course as of 09/20/20 1431  Fri Sep 20, 2020  1121 CXR unremarkable [WF]  1255 BMP with no significant changes from prior.  COVID test negative for influenza and COVID.  CBC with mild leukocytosis 15.  No significant abnormalities apart from this.  Patient is afebrile with no tachycardia.  Has no urinary symptoms and has a clear chest x-ray with lungs are clear to auscultation all fields.  Suspect that he is experiencing a viral URI however given that his nose is inflamed and irritated there is concern for cellulitis/abscess.  Attending physician to assess patient. Pt in no acute distress.  [WF]    Clinical Course User Index [  WF] Tedd Sias, [place]   MDM Rules/Calculators/A&P                          Patient is a 87 year old male with past medical  history significant for stroke presented today with severe nasal septum irritation, congestion, cough and rhinorrhea this is been ongoing for the past 3 days.  Patient is vaccinated against COVID. Denies any chest pain or shortness of breath.  Is overall well-appearing on physical exam apart from having slightly edematous slightly swollen nasal septum is very well-appearing on examination.  Patient was assessed by my attending physician at bedside  BMP, CBC, respiratory panel, chest x-ray 1 view and EKG obtained.  Labs interpreted above  COVID test is negative for COVID and influenza.  Will DC w doxycycline.  Suspect viral illness for his congestion runny nose.  Will follow up with PCP.  Warm compresses and Tylenol.  Also given ear nose and throat provider to follow-up with.   Final Clinical Impression(s) / ED Diagnoses Final diagnoses:  Viral URI with cough  Abscess of nose    Rx / DC Orders ED Discharge Orders         Ordered    doxycycline (VIBRAMYCIN) 100 MG capsule  2 times daily        09/20/20 1421           Pati Gallo [address], [place] 09/20/20 1505    Pattricia Boss, MD 09/23/20 1332

## 2020-09-20 NOTE — Discharge Instructions (Addendum)
You have a nasal abscess that was incised today.  Given that there was not much pus that came from the infection I think it would be reasonable to have this reevaluated by either your primary care provider or an ear nose and throat doctor.  I provided you the information for an ear nose and throat clinic here in [place].  Please use the antibiotic as prescribed.  You likely have a viral upper respiratory tract infection as well.  Your COVID and influenza test were negative.  Chest x-ray is well-appearing and your lab work was reassuring as well.  You may always return to the ER for any new or concerning symptoms.

## 2020-09-20 NOTE — ED Triage Notes (Signed)
Pt BIB [facility] from home for [place] for the last day. Denies pain. Denies sick contacts. Hx stroke with R sided deficits. 20ga LAC. Lung sounds clear.  BP 160/94 HR 88 SpO2 98% RA CBG 126 Temp 98.1

## 2022-07-24 ENCOUNTER — Inpatient Hospital Stay (HOSPITAL_COMMUNITY)
Admission: EM | Admit: 2022-07-24 | Discharge: 2022-07-25 | DRG: 378 | Disposition: A | Discharge status: Home / Self Care | Payer: Medicare HMO | Attending: Internal Medicine | Admitting: Internal Medicine

## 2022-07-24 ENCOUNTER — Emergency Department (HOSPITAL_COMMUNITY): Payer: Medicare HMO

## 2022-07-24 ENCOUNTER — Encounter (HOSPITAL_COMMUNITY): Payer: Self-pay

## 2022-07-24 ENCOUNTER — Other Ambulatory Visit: Payer: Self-pay

## 2022-07-24 DIAGNOSIS — Z85038 Personal history of other malignant neoplasm of large intestine: Secondary | ICD-10-CM

## 2022-07-24 DIAGNOSIS — N1831 Chronic kidney disease, stage 3a: Secondary | ICD-10-CM | POA: Diagnosis present

## 2022-07-24 DIAGNOSIS — K922 Gastrointestinal hemorrhage, unspecified: Secondary | ICD-10-CM | POA: Diagnosis not present

## 2022-07-24 DIAGNOSIS — Z8249 Family history of ischemic heart disease and other diseases of the circulatory system: Secondary | ICD-10-CM | POA: Diagnosis not present

## 2022-07-24 DIAGNOSIS — I5032 Chronic diastolic (congestive) heart failure: Secondary | ICD-10-CM | POA: Diagnosis present

## 2022-07-24 DIAGNOSIS — I69322 Dysarthria following cerebral infarction: Secondary | ICD-10-CM

## 2022-07-24 DIAGNOSIS — Z885 Allergy status to narcotic agent status: Secondary | ICD-10-CM

## 2022-07-24 DIAGNOSIS — I509 Heart failure, unspecified: Secondary | ICD-10-CM | POA: Diagnosis not present

## 2022-07-24 DIAGNOSIS — I130 Hypertensive heart and chronic kidney disease with heart failure and stage 1 through stage 4 chronic kidney disease, or unspecified chronic kidney disease: Secondary | ICD-10-CM | POA: Diagnosis present

## 2022-07-24 DIAGNOSIS — Z9049 Acquired absence of other specified parts of digestive tract: Secondary | ICD-10-CM

## 2022-07-24 DIAGNOSIS — I69351 Hemiplegia and hemiparesis following cerebral infarction affecting right dominant side: Secondary | ICD-10-CM

## 2022-07-24 DIAGNOSIS — D631 Anemia in chronic kidney disease: Secondary | ICD-10-CM | POA: Diagnosis present

## 2022-07-24 DIAGNOSIS — K254 Chronic or unspecified gastric ulcer with hemorrhage: Principal | ICD-10-CM | POA: Diagnosis present

## 2022-07-24 DIAGNOSIS — Z823 Family history of stroke: Secondary | ICD-10-CM | POA: Diagnosis not present

## 2022-07-24 DIAGNOSIS — Z808 Family history of malignant neoplasm of other organs or systems: Secondary | ICD-10-CM

## 2022-07-24 DIAGNOSIS — I6930 Unspecified sequelae of cerebral infarction: Secondary | ICD-10-CM

## 2022-07-24 DIAGNOSIS — I110 Hypertensive heart disease with heart failure: Secondary | ICD-10-CM | POA: Diagnosis not present

## 2022-07-24 DIAGNOSIS — K2270 Barrett's esophagus without dysplasia: Secondary | ICD-10-CM | POA: Diagnosis present

## 2022-07-24 DIAGNOSIS — K2289 Other specified disease of esophagus: Secondary | ICD-10-CM | POA: Diagnosis not present

## 2022-07-24 DIAGNOSIS — Z66 Do not resuscitate: Secondary | ICD-10-CM | POA: Diagnosis present

## 2022-07-24 DIAGNOSIS — Z79899 Other long term (current) drug therapy: Secondary | ICD-10-CM | POA: Diagnosis not present

## 2022-07-24 DIAGNOSIS — K259 Gastric ulcer, unspecified as acute or chronic, without hemorrhage or perforation: Secondary | ICD-10-CM | POA: Diagnosis not present

## 2022-07-24 DIAGNOSIS — I10 Essential (primary) hypertension: Secondary | ICD-10-CM | POA: Diagnosis present

## 2022-07-24 DIAGNOSIS — Z7902 Long term (current) use of antithrombotics/antiplatelets: Secondary | ICD-10-CM | POA: Diagnosis not present

## 2022-07-24 DIAGNOSIS — I6529 Occlusion and stenosis of unspecified carotid artery: Secondary | ICD-10-CM | POA: Diagnosis present

## 2022-07-24 DIAGNOSIS — Z87891 Personal history of nicotine dependence: Secondary | ICD-10-CM

## 2022-07-24 DIAGNOSIS — E785 Hyperlipidemia, unspecified: Secondary | ICD-10-CM | POA: Diagnosis present

## 2022-07-24 DIAGNOSIS — K920 Hematemesis: Secondary | ICD-10-CM | POA: Diagnosis present

## 2022-07-24 LAB — CBC
HCT: 33.2 % — ABNORMAL LOW (ref 39.0–52.0)
HCT: 30.5 % — ABNORMAL LOW (ref 39.0–52.0)
Hemoglobin: 10.9 g/dL — ABNORMAL LOW (ref 13.0–17.0)
Hemoglobin: 10.1 g/dL — ABNORMAL LOW (ref 13.0–17.0)
MCH: 30.4 pg (ref 26.0–34.0)
MCH: 30.7 pg (ref 26.0–34.0)
MCHC: 32.8 g/dL (ref 30.0–36.0)
MCHC: 33.1 g/dL (ref 30.0–36.0)
MCV: 92.7 fL (ref 80.0–100.0)
MCV: 92.7 fL (ref 80.0–100.0)
Platelets: 112 10*3/uL — ABNORMAL LOW (ref 150–400)
Platelets: 111 10*3/uL — ABNORMAL LOW (ref 150–400)
RBC: 3.58 MIL/uL — ABNORMAL LOW (ref 4.22–5.81)
RBC: 3.29 MIL/uL — ABNORMAL LOW (ref 4.22–5.81)
RDW: 13.2 % (ref 11.5–15.5)
RDW: 13.2 % (ref 11.5–15.5)
WBC: 10.1 10*3/uL (ref 4.0–10.5)
WBC: 7.1 10*3/uL (ref 4.0–10.5)
nRBC: 0 % (ref 0.0–0.2)
nRBC: 0 % (ref 0.0–0.2)

## 2022-07-24 LAB — CBC WITH DIFFERENTIAL/PLATELET
Abs Immature Granulocytes: 0.03 10*3/uL (ref 0.00–0.07)
Basophils Absolute: 0 10*3/uL (ref 0.0–0.1)
Basophils Relative: 0 %
Eosinophils Absolute: 0.3 10*3/uL (ref 0.0–0.5)
Eosinophils Relative: 3 %
HCT: 37.6 % — ABNORMAL LOW (ref 39.0–52.0)
Hemoglobin: 12.1 g/dL — ABNORMAL LOW (ref 13.0–17.0)
Immature Granulocytes: 0 %
Lymphocytes Relative: 17 %
Lymphs Abs: 1.7 10*3/uL (ref 0.7–4.0)
MCH: 30.2 pg (ref 26.0–34.0)
MCHC: 32.2 g/dL (ref 30.0–36.0)
MCV: 93.8 fL (ref 80.0–100.0)
Monocytes Absolute: 0.6 10*3/uL (ref 0.1–1.0)
Monocytes Relative: 6 %
Neutro Abs: 7 10*3/uL (ref 1.7–7.7)
Neutrophils Relative %: 74 %
Platelets: 132 10*3/uL — ABNORMAL LOW (ref 150–400)
RBC: 4.01 MIL/uL — ABNORMAL LOW (ref 4.22–5.81)
RDW: 13.2 % (ref 11.5–15.5)
WBC: 9.6 10*3/uL (ref 4.0–10.5)
nRBC: 0 % (ref 0.0–0.2)

## 2022-07-24 LAB — PROTIME-INR
INR: 1.1 (ref 0.8–1.2)
Prothrombin Time: 14 seconds (ref 11.4–15.2)

## 2022-07-24 LAB — TYPE AND SCREEN
ABO/RH(D): O POS
Antibody Screen: NEGATIVE

## 2022-07-24 LAB — I-STAT CHEM 8, ED
BUN: 38 mg/dL — ABNORMAL HIGH (ref 8–23)
Calcium, Ion: 1.17 mmol/L (ref 1.15–1.40)
Chloride: 110 mmol/L (ref 98–111)
Creatinine, Ser: 1.7 mg/dL — ABNORMAL HIGH (ref 0.61–1.24)
Glucose, Bld: 101 mg/dL — ABNORMAL HIGH (ref 70–99)
HCT: 34 % — ABNORMAL LOW (ref 39.0–52.0)
Hemoglobin: 11.6 g/dL — ABNORMAL LOW (ref 13.0–17.0)
Potassium: 4.5 mmol/L (ref 3.5–5.1)
Sodium: 142 mmol/L (ref 135–145)
TCO2: 23 mmol/L (ref 22–32)

## 2022-07-24 LAB — COMPREHENSIVE METABOLIC PANEL
ALT: 12 U/L (ref 0–44)
AST: 20 U/L (ref 15–41)
Albumin: 3.5 g/dL (ref 3.5–5.0)
Alkaline Phosphatase: 52 U/L (ref 38–126)
Anion gap: 4 — ABNORMAL LOW (ref 5–15)
BUN: 40 mg/dL — ABNORMAL HIGH (ref 8–23)
CO2: 25 mmol/L (ref 22–32)
Calcium: 8.7 mg/dL — ABNORMAL LOW (ref 8.9–10.3)
Chloride: 109 mmol/L (ref 98–111)
Creatinine, Ser: 1.66 mg/dL — ABNORMAL HIGH (ref 0.61–1.24)
GFR, Estimated: 40 mL/min — ABNORMAL LOW (ref >60)
Glucose, Bld: 109 mg/dL — ABNORMAL HIGH (ref 70–99)
Potassium: 4.4 mmol/L (ref 3.5–5.1)
Sodium: 138 mmol/L (ref 135–145)
Total Bilirubin: 0.7 mg/dL (ref 0.3–1.2)
Total Protein: 5.8 g/dL — ABNORMAL LOW (ref 6.5–8.1)

## 2022-07-24 MED ORDER — SODIUM CHLORIDE 0.9 % IV SOLN: INTRAVENOUS | Status: DC

## 2022-07-24 MED ORDER — ACETAMINOPHEN 650 MG RE SUPP: 650.0000 mg | Freq: Four times a day (QID) | RECTAL | Status: DC | PRN

## 2022-07-24 MED ORDER — ACETAMINOPHEN 325 MG PO TABS: 650.0000 mg | ORAL_TABLET | Freq: Four times a day (QID) | ORAL | Status: DC | PRN

## 2022-07-24 MED ORDER — PANTOPRAZOLE INFUSION (NEW) - SIMPLE MED
8.0000 mg/h | INTRAVENOUS | Status: DC
  Administered 2022-07-24 (×2): 8 mg/h via INTRAVENOUS
  Filled 2022-07-24 (×3): qty 100

## 2022-07-24 MED ORDER — ATORVASTATIN CALCIUM 40 MG PO TABS
40.0000 mg | ORAL_TABLET | Freq: Every day | ORAL | Status: DC
  Administered 2022-07-24: 40 mg via ORAL
  Filled 2022-07-24: qty 1

## 2022-07-24 MED ORDER — SODIUM CHLORIDE 0.9 % IV BOLUS
1000.0000 mL | Freq: Once | INTRAVENOUS | Status: AC
  Administered 2022-07-24: 1000 mL via INTRAVENOUS

## 2022-07-24 MED ORDER — ONDANSETRON HCL 4 MG/2ML IJ SOLN
4.0000 mg | Freq: Once | INTRAMUSCULAR | Status: AC
  Administered 2022-07-24: 4 mg via INTRAVENOUS
  Filled 2022-07-24: qty 2

## 2022-07-24 MED ORDER — ONDANSETRON HCL 4 MG PO TABS: 4.0000 mg | ORAL_TABLET | Freq: Four times a day (QID) | ORAL | Status: DC | PRN

## 2022-07-24 MED ORDER — ONDANSETRON HCL 4 MG/2ML IJ SOLN: 4.0000 mg | Freq: Four times a day (QID) | INTRAMUSCULAR | Status: DC | PRN

## 2022-07-24 MED ORDER — PANTOPRAZOLE 80MG IVPB - SIMPLE MED
80.0000 mg | Freq: Once | INTRAVENOUS | Status: AC
  Administered 2022-07-24: 80 mg via INTRAVENOUS
  Filled 2022-07-24: qty 100

## 2022-07-24 NOTE — ED Provider Notes (Signed)
[facility] Provider Note   CSN: [id number] Arrival date & time: 07/24/22  1310     History  Chief Complaint  Patient presents with   GI Bleeding    William Patel is a 89 y.o. male.  Patient with a history of previous stroke, generalized weakness presenting with hematemesis.  EMS reports sudden onset of bright red blood in emesis this afternoon.  Was not ill prior to this.  No history of ulcers or esophageal varices.  Has used alcohol remotely.  He is on Plavix.  No recent black or bloody stools.  Did have some abdominal cramping earlier but this is since resolved.  Denies any abdominal pain currently.  No chest pain or shortness of breath.  States he feels well at this time. Does not think he is ever had an EGD.  The history is provided by the patient and the EMS personnel. The history is limited by the condition of the patient.       Home Medications Prior to Admission medications   Medication Sig Start Date End Date Taking? Authorizing Provider  acetaminophen (TYLENOL) 325 MG tablet Take 650 mg by mouth every 4 (four) hours as needed for mild pain, fever or headache.    [provider]  atorvastatin (LIPITOR) 40 MG tablet Take 1 tablet (40 mg total) by mouth daily. 12/21/17   Frann Rider, NP  clobetasol cream (TEMOVATE) [id number] % Apply 1 application topically 2 (two) times daily. Breaking out in corners of mouth 08/30/20   [provider]  clopidogrel (PLAVIX) 75 MG tablet Take 1 tablet (75 mg total) by mouth daily. 06/23/18   Frann Rider, NP  meclizine (ANTIVERT) 12.5 MG tablet Take 12.5 mg by mouth 3 (three) times daily as needed (vertigo).    [provider]  OVER THE COUNTER MEDICATION Take 1 tablet by mouth daily.    [provider]      Allergies    Tramadol    Review of Systems   Review of Systems  Constitutional:  Positive for fatigue. Negative for activity change, appetite change and  fever.  HENT:  Negative for congestion and rhinorrhea.   Respiratory:  Negative for cough, chest tightness and shortness of breath.   Cardiovascular:  Negative for chest pain.  Gastrointestinal:  Positive for abdominal pain, nausea and vomiting.  Genitourinary:  Negative for dysuria and hematuria.  Musculoskeletal:  Negative for arthralgias and myalgias.  Skin:  Negative for rash.  Neurological:  Positive for dizziness, weakness and light-headedness. Negative for headaches.   all other systems are negative except as noted in the HPI and PMH.    Physical Exam Updated Vital Signs BP 97/67   Pulse 75   Temp 97.6 F (36.4 C) (Oral)   Resp 16   Ht '5\' 2"'$  (1.575 m)   Wt 56.2 kg   SpO2 100%   BMI 22.66 kg/m  Physical Exam Vitals and nursing note reviewed.  Constitutional:      General: He is not in acute distress.    Appearance: He is well-developed.     Comments: Dysarthria which is apparently baseline  HENT:     Head: Normocephalic and atraumatic.     Comments: Dried blood to oropharynx and face.    Mouth/Throat:     Pharynx: No oropharyngeal exudate.  Eyes:     Conjunctiva/sclera: Conjunctivae normal.     Pupils: Pupils are equal, round, and reactive to light.  Neck:  Comments: No meningismus. Cardiovascular:     Rate and Rhythm: Normal rate and regular rhythm.     Heart sounds: Normal heart sounds. No murmur heard. Pulmonary:     Effort: Pulmonary effort is normal. No respiratory distress.     Breath sounds: Normal breath sounds.  Abdominal:     Palpations: Abdomen is soft.     Tenderness: There is abdominal tenderness. There is no guarding or rebound.     Comments: Epigastric tenderness, no guarding or rebound  Musculoskeletal:        General: No tenderness. Normal range of motion.     Cervical back: Normal range of motion and neck supple.  Skin:    General: Skin is warm.  Neurological:     Mental Status: He is alert and oriented to person, place, and time.      Cranial Nerves: No cranial nerve deficit.     Motor: No abnormal muscle tone.     Coordination: Coordination normal.     Comments:  5/5 strength throughout. CN 2-12 intact.Equal grip strength.   Psychiatric:        Behavior: Behavior normal.     ED Results / Procedures / Treatments   Labs (all labs ordered are listed, but only abnormal results are displayed) Labs Reviewed  COMPREHENSIVE METABOLIC PANEL - Abnormal; Notable for the following components:      Result Value   Glucose, Bld 109 (*)    BUN 40 (*)    Creatinine, Ser 1.66 (*)    Calcium 8.7 (*)    Total Protein 5.8 (*)    GFR, Estimated 40 (*)    Anion gap 4 (*)    All other components within normal limits  CBC WITH DIFFERENTIAL/PLATELET - Abnormal; Notable for the following components:   RBC 4.01 (*)    Hemoglobin 12.1 (*)    HCT 37.6 (*)    Platelets 132 (*)    All other components within normal limits  I-STAT CHEM 8, ED - Abnormal; Notable for the following components:   BUN 38 (*)    Creatinine, Ser 1.70 (*)    Glucose, Bld 101 (*)    Hemoglobin 11.6 (*)    HCT 34.0 (*)    All other components within normal limits  PROTIME-INR  CBC  CBC  CBC  CBC  POC OCCULT BLOOD, ED  TYPE AND SCREEN    EKG None  Radiology DG Abd 1 View  Result Date: 07/24/2022 CLINICAL DATA:  Vomiting EXAM: ABDOMEN - 1 VIEW COMPARISON:  CT abdomen/pelvis 03/27/2016 FINDINGS: There is a nonobstructive bowel gas pattern. There is no definite free intraperitoneal air, within the confines of supine technique. There is no gross organomegaly. There is a possible left renal stone measuring 5-6 mm. There is no other abnormal soft tissue calcification There is no acute osseous abnormality. IMPRESSION: 1. Nonobstructive bowel gas pattern. 2. Possible left renal stone. Electronically Signed   By: Valetta Mole M.D.   On: 07/24/2022 15:02   DG Chest Portable 1 View  Result Date: 07/24/2022 CLINICAL DATA:  Vomiting EXAM: PORTABLE CHEST - 1 VIEW  COMPARISON:  None Available. FINDINGS: Cardiomediastinal silhouette and pulmonary vasculature are within normal limits. Lungs are clear. IMPRESSION: No acute cardiopulmonary process. Electronically Signed   By: Miachel Roux M.D.   On: 07/24/2022 14:57    Procedures .Critical Care  Performed by: Ezequiel Essex, MD Authorized by: Ezequiel Essex, MD   Critical care provider statement:    Critical care time (  minutes):  45   Critical care time was exclusive of:  Separately billable procedures and treating other patients   Critical care was necessary to treat or prevent imminent or life-threatening deterioration of the following conditions: GI bleed.   Critical care was time spent personally by me on the following activities:  Development of treatment plan with patient or surrogate, discussions with consultants, evaluation of patient's response to treatment, examination of patient, ordering and review of laboratory studies, ordering and review of radiographic studies, ordering and performing treatments and interventions, pulse oximetry, re-evaluation of patient's condition and review of old charts     Medications Ordered in ED Medications  sodium chloride 0.9 % bolus 1,000 mL (has no administration in time range)    And  0.9 %  sodium chloride infusion (has no administration in time range)  pantoprazole (PROTONIX) 80 mg /NS 100 mL IVPB (has no administration in time range)  pantoprozole (PROTONIX) 80 mg /NS 100 mL infusion (has no administration in time range)  ondansetron (ZOFRAN) injection 4 mg (has no administration in time range)    ED Course/ Medical Decision Making/ A&P                             Medical Decision Making Amount and/or Complexity of Data Reviewed Independent Historian: EMS Labs: ordered. Decision-making details documented in ED Course. Radiology: ordered and independent interpretation performed. Decision-making details documented in ED Course. ECG/medicine tests:  ordered and independent interpretation performed. Decision-making details documented in ED Course.  Risk Prescription drug management. Decision regarding hospitalization.   Hematemesis sudden onset this afternoon.  Abdomen soft with mild epigastric tenderness.  Will initiate IV access, IV fluids, IV PPI, check labs as well as type and screen.  Take plavix. No other anticoagulation.  Hemoglobin 11.6 which is slightly down from 12-13 range.  Creatinine 1.6  IVF given, IVF PPI.  D/w Raynelle Fanning PA-C as well as Dr. Alessandra Bevels of GI. He appears to be an Animal nutritionist patient. They will plan on EGD in the morning. Do not recommend any additional imaging.   BP improving to [id number] systolic.  Abdomen soft.  AAS negative for acute surgical pathology.   Admission d/w Dr. Posey Pronto.        Final Clinical Impression(s) / ED Diagnoses Final diagnoses:  Upper GI bleed    Rx / DC Orders ED Discharge Orders     None         Shemica Meath, Annie Main, MD 07/24/22 [phone]

## 2022-07-24 NOTE — Progress Notes (Signed)
Pt arrived to unit from ED, A/O x 4,  CCMD called ,CHG given, pt oriented to unit,Will continue to monitor.   [name], RN    07/24/22 1817  Vitals  Temp 98.5 F (36.9 C)  Temp Source Oral  BP (!) 157/76  MAP (mmHg) 99  BP Location Left Arm  BP Method Automatic  Patient Position (if appropriate) Lying  Pulse Rate 65  Pulse Rate Source Monitor  ECG Heart Rate 65  Resp 20  Level of Consciousness  Level of Consciousness Alert  Oxygen Therapy  SpO2 100 %  O2 Device Room Air  O2 Flow Rate (L/min) 0 L/min  Pain Assessment  Pain Scale 0-10  Pain Score 0  MEWS Score  MEWS Temp 0  MEWS Systolic 0  MEWS Pulse 0  MEWS RR 0  MEWS LOC 0  MEWS Score 0  MEWS Score Color [name]

## 2022-07-24 NOTE — Consult Note (Signed)
Referring Provider: ED Primary Care Physician:  Leonard Downing, MD Primary Gastroenterologist: Previous patient of Dr. Penelope Coop  Reason for Consultation: GI bleed   HPI: William Patel is a 89 y.o. male with past medical history of colon cancer s/p partial colon resection in 2013, history of CVA with residual right-sided deficit currently on Plavix, history of CHF, chronic kidney disease and carotid artery disease presented to the hospital with hematemesis.  Blood work showed mild anemia with hemoglobin of 12.1, platelet count of 132, normal LFTs, normal INR.    Patient seen and examined at bedside.  Limited history obtained from the patient.  History was not reliable.  Case discussed with ED physician.  He reported EMS finding patient covered in vomiting of blood.   Last colonoscopy in February 2013 by Dr. Penelope Coop showed descending colon mass.  Biopsy was positive for adenocarcinoma.  He subsequently underwent partial colon resection in April 2013.  Patient was lost to follow-up after that.  Past Medical History:  Diagnosis Date   Colon cancer ([place])    Left    Generalized headaches    Stroke [facility])    Visual disturbances     Past Surgical History:  Procedure Laterality Date   COLON RESECTION  09/21/2011   Procedure: COLON RESECTION LAPAROSCOPIC;  Surgeon: Pedro Earls, MD;  Location: [facility];  Service: General;  Laterality: N/A;      COLONOSCOPY  feb 2013   endo done also    Prior to Admission medications   Medication Sig Start Date End Date Taking? Authorizing Provider  acetaminophen (TYLENOL) 325 MG tablet Take 650 mg by mouth every 4 (four) hours as needed for mild pain, fever or headache.    [provider]  atorvastatin (LIPITOR) 40 MG tablet Take 1 tablet (40 mg total) by mouth daily. 12/21/17   Frann Rider, NP  clobetasol cream (TEMOVATE) [id number] % Apply 1 application topically 2 (two) times daily. Breaking out in corners of mouth 08/30/20   [provider]  clopidogrel (PLAVIX) 75 MG tablet Take 1 tablet (75 mg total) by mouth daily. 06/23/18   Frann Rider, NP  meclizine (ANTIVERT) 12.5 MG tablet Take 12.5 mg by mouth 3 (three) times daily as needed (vertigo).    [provider]  OVER THE COUNTER MEDICATION Take 1 tablet by mouth daily.    [provider]    Scheduled Meds: Continuous Infusions:  sodium chloride 125 mL/hr at 07/24/22 1419   pantoprazole 8 mg/hr (07/24/22 1418)   PRN Meds:.  Allergies as of 07/24/2022 - Review Complete 07/24/2022  Allergen Reaction Noted   Tramadol Other (See Comments) 09/03/2011    Family History  Problem Relation Age of Onset   Heart failure Mother    Brain cancer Father    Stroke Brother     Social History   Socioeconomic History   Marital status: Widowed    Spouse name: Not on file   Number of children: Not on file   Years of education: Not on file   Highest education level: Not on file  Occupational History   Not on file  Tobacco Use   Smoking status: Former    Types: Cigars   Smokeless tobacco: Current    Types: Chew   Tobacco comments:    1 to 2 per month  Vaping Use   Vaping Use: Never used  Substance and Sexual Activity   Alcohol use: Yes    Alcohol/week: 3.0 standard drinks of alcohol  Types: 3 Cans of beer per week   Drug use: No   Sexual activity: Not on file  Other Topics Concern   Not on file  Social History Narrative   Not on file   Social Determinants of Health   Financial Resource Strain: Not on file  Food Insecurity: Not on file  Transportation Needs: Not on file  Physical Activity: Not on file  Stress: Not on file  Social Connections: Not on file  Intimate Partner Violence: Not on file    Review of Systems: Limited review of system  Physical Exam: Vital signs: Vitals:   07/24/22 1400 07/24/22 1415  BP: 111/70 (!) 116/57  Pulse: 65 63  Resp: 19 18  Temp:    SpO2: 100% 100%     General:   Alert and  oriented with residual right-sided weakness Lungs: No visible respiratory distress Heart:  Regular rate and rhythm; no murmurs, clicks, rubs,  or gallops. Abdomen: Epigastric tenderness to palpation, abdomen is otherwise soft, bowel sounds present, no peritoneal signs Rectal:  Deferred  GI:  Lab Results: Recent Labs    07/24/22 1327 07/24/22 1342  WBC 9.6  --   HGB 12.1* 11.6*  HCT 37.6* 34.0*  PLT 132*  --    BMET Recent Labs    07/24/22 1327 07/24/22 1342  NA 138 142  K 4.4 4.5  CL 109 110  CO2 25  --   GLUCOSE 109* 101*  BUN 40* 38*  CREATININE 1.66* 1.70*  CALCIUM 8.7*  --    LFT Recent Labs    07/24/22 1327  PROT 5.8*  ALBUMIN 3.5  AST 20  ALT 12  ALKPHOS 52  BILITOT 0.7   PT/INR Recent Labs    07/24/22 1327  LABPROT 14.0  INR 1.1     Studies/Results: No results found.  Impression/Plan: -Hematemesis.  Not able to get reliable history from the patient.  Hemoglobin stable. -History of colon cancer status post colon resection in 2013.  No follow-up colonoscopy after that.  Patient was lost to follow-up. -History of CVA.  Was on Plavix.  Unknown last dose.  Recommendations ------------------------- -Continue supportive care for now -Okay to have clear liquid diet from GI standpoint -Tentative plan for EGD tomorrow depending on anesthesia availability. -Return H&H.  Transfuse if hemoglobin less than 8.  Continue Protonix drip.  Hold Plavix.  Risks (bleeding, infection, bowel perforation that could require surgery, sedation-related changes in cardiopulmonary systems), benefits (identification and possible treatment of source of symptoms, exclusion of certain causes of symptoms), and alternatives (watchful waiting, radiographic imaging studies, empiric medical treatment)  were explained to patient/family in detail and patient wishes to proceed.     LOS: 0 days   Otis Brace  MD, FACP 07/24/2022, 2:37 PM  Contact #  [phone]

## 2022-07-24 NOTE — ED Triage Notes (Signed)
Wife called EMS for sudden onset of bright red blood vomiting.  Ems reports a large amount of blood with large amounts of clots on there arrival pt has good amount on clothing from the 1 episode.  Denies abdominal pain at this time

## 2022-07-24 NOTE — H&P (View-Only) (Signed)
Referring Provider: ED Primary Care Physician:  Leonard Downing, MD Primary Gastroenterologist: Previous patient of Dr. Penelope Coop  Reason for Consultation: GI bleed   HPI: William Patel is a 89 y.o. male with past medical history of colon cancer s/p partial colon resection in 2013, history of CVA with residual right-sided deficit currently on Plavix, history of CHF, chronic kidney disease and carotid artery disease presented to the hospital with hematemesis.  Blood work showed mild anemia with hemoglobin of 12.1, platelet count of 132, normal LFTs, normal INR.    Patient seen and examined at bedside.  Limited history obtained from the patient.  History was not reliable.  Case discussed with ED physician.  He reported EMS finding patient covered in vomiting of blood.   Last colonoscopy in February 2013 by Dr. Penelope Coop showed descending colon mass.  Biopsy was positive for adenocarcinoma.  He subsequently underwent partial colon resection in April 2013.  Patient was lost to follow-up after that.  Past Medical History:  Diagnosis Date   Colon cancer ([place])    Left    Generalized headaches    Stroke [facility])    Visual disturbances     Past Surgical History:  Procedure Laterality Date   COLON RESECTION  09/21/2011   Procedure: COLON RESECTION LAPAROSCOPIC;  Surgeon: Pedro Earls, MD;  Location[facility];  Service: General;  Laterality: N/A;      COLONOSCOPY  feb 2013   endo done also    Prior to Admission medications   Medication Sig Start Date End Date Taking? Authorizing Provider  acetaminophen (TYLENOL) 325 MG tablet Take 650 mg by mouth every 4 (four) hours as needed for mild pain, fever or headache.    [provider]  atorvastatin (LIPITOR) 40 MG tablet Take 1 tablet (40 mg total) by mouth daily. 12/21/17   Frann Rider, NP  clobetasol cream (TEMOVATE) [id number] % Apply 1 application topically 2 (two) times daily. Breaking out in corners of mouth 08/30/20   [provider]  clopidogrel (PLAVIX) 75 MG tablet Take 1 tablet (75 mg total) by mouth daily. 06/23/18   Frann Rider, NP  meclizine (ANTIVERT) 12.5 MG tablet Take 12.5 mg by mouth 3 (three) times daily as needed (vertigo).    [provider]  OVER THE COUNTER MEDICATION Take 1 tablet by mouth daily.    [provider]    Scheduled Meds: Continuous Infusions:  sodium chloride 125 mL/hr at 07/24/22 1419   pantoprazole 8 mg/hr (07/24/22 1418)   PRN Meds:.  Allergies as of 07/24/2022 - Review Complete 07/24/2022  Allergen Reaction Noted   Tramadol Other (See Comments) 09/03/2011    Family History  Problem Relation Age of Onset   Heart failure Mother    Brain cancer Father    Stroke Brother     Social History   Socioeconomic History   Marital status: Widowed    Spouse name: Not on file   Number of children: Not on file   Years of education: Not on file   Highest education level: Not on file  Occupational History   Not on file  Tobacco Use   Smoking status: Former    Types: Cigars   Smokeless tobacco: Current    Types: Chew   Tobacco comments:    1 to 2 per month  Vaping Use   Vaping Use: Never used  Substance and Sexual Activity   Alcohol use: Yes    Alcohol/week: 3.0 standard drinks of alcohol  Types: 3 Cans of beer per week   Drug use: No   Sexual activity: Not on file  Other Topics Concern   Not on file  Social History Narrative   Not on file   Social Determinants of Health   Financial Resource Strain: Not on file  Food Insecurity: Not on file  Transportation Needs: Not on file  Physical Activity: Not on file  Stress: Not on file  Social Connections: Not on file  Intimate Partner Violence: Not on file    Review of Systems: Limited review of system  Physical Exam: Vital signs: Vitals:   07/24/22 1400 07/24/22 1415  BP: 111/70 (!) 116/57  Pulse: 65 63  Resp: 19 18  Temp:    SpO2: 100% 100%     General:   Alert and  oriented with residual right-sided weakness Lungs: No visible respiratory distress Heart:  Regular rate and rhythm; no murmurs, clicks, rubs,  or gallops. Abdomen: Epigastric tenderness to palpation, abdomen is otherwise soft, bowel sounds present, no peritoneal signs Rectal:  Deferred  GI:  Lab Results: Recent Labs    07/24/22 1327 07/24/22 1342  WBC 9.6  --   HGB 12.1* 11.6*  HCT 37.6* 34.0*  PLT 132*  --    BMET Recent Labs    07/24/22 1327 07/24/22 1342  NA 138 142  K 4.4 4.5  CL 109 110  CO2 25  --   GLUCOSE 109* 101*  BUN 40* 38*  CREATININE 1.66* 1.70*  CALCIUM 8.7*  --    LFT Recent Labs    07/24/22 1327  PROT 5.8*  ALBUMIN 3.5  AST 20  ALT 12  ALKPHOS 52  BILITOT 0.7   PT/INR Recent Labs    07/24/22 1327  LABPROT 14.0  INR 1.1     Studies/Results: No results found.  Impression/Plan: -Hematemesis.  Not able to get reliable history from the patient.  Hemoglobin stable. -History of colon cancer status post colon resection in 2013.  No follow-up colonoscopy after that.  Patient was lost to follow-up. -History of CVA.  Was on Plavix.  Unknown last dose.  Recommendations ------------------------- -Continue supportive care for now -Okay to have clear liquid diet from GI standpoint -Tentative plan for EGD tomorrow depending on anesthesia availability. -Return H&H.  Transfuse if hemoglobin less than 8.  Continue Protonix drip.  Hold Plavix.  Risks (bleeding, infection, bowel perforation that could require surgery, sedation-related changes in cardiopulmonary systems), benefits (identification and possible treatment of source of symptoms, exclusion of certain causes of symptoms), and alternatives (watchful waiting, radiographic imaging studies, empiric medical treatment)  were explained to patient/family in detail and patient wishes to proceed.     LOS: 0 days   Otis Brace  MD, FACP 07/24/2022, 2:37 PM  Contact #  [phone]

## 2022-07-24 NOTE — ED Notes (Signed)
ED TO INPATIENT HANDOFF REPORT  ED Nurse Name and Phone #: [phone]  S Name/Age/Gender William Patel 89 y.o. male Room/Bed: 022C/022C  Code Status   Code Status: DNR  Home/SNF/Other Home Patient oriented to: self, place, time, and situation Is this baseline? Yes   Triage Complete: Triage complete  Chief Complaint Upper GI bleed [K92.2]  Triage Note Wife called EMS for sudden onset of bright red blood vomiting.  Ems reports a large amount of blood with large amounts of clots on there arrival pt has good amount on clothing from the 1 episode.  Denies abdominal pain at this time    Allergies Allergies  Allergen Reactions   Tramadol Other (See Comments)    Causes headaches    Level of Care/Admitting Diagnosis ED Disposition     ED Disposition  Admit   Condition  --   [place]: [place] [[id number]]  Level of Care: Progressive [102]  Admit to Progressive based on following criteria: GI, ENDOCRINE disease patients with GI bleeding, acute liver failure or pancreatitis, stable with diabetic ketoacidosis or thyrotoxicosis (hypothyroid) state.  May admit patient to [facility] or [facility] if equivalent level of care is available:: No  Covid Evaluation: Asymptomatic - no recent exposure (last 10 days) testing not required  Diagnosis: Upper GI bleed [id number]  Admitting Physician: Lavina Hamman [id number]  Attending Physician: Lavina Hamman [id number]  Certification:: I certify there are rare and unusual circumstances requiring inpatient admission  Estimated Length of Stay: 2          B Medical/Surgery History Past Medical History:  Diagnosis Date   Colon cancer ([place])    Left    Generalized headaches    Stroke [facility])    Visual disturbances    Past Surgical History:  Procedure Laterality Date   COLON RESECTION  09/21/2011   Procedure: COLON RESECTION LAPAROSCOPIC;  Surgeon: Pedro Earls, MD;  Location: [facility];  Service:  General;  Laterality: N/A;      COLONOSCOPY  feb 2013   endo done also     A IV Location/Drains/Wounds Patient Lines/Drains/Airways Status     Active Line/Drains/Airways     Name Placement date Placement time Site Days   Peripheral IV 07/24/22 18 G Left Antecubital 07/24/22  1317  Antecubital  less than 1   Peripheral IV 07/24/22 18 G Right Antecubital 07/24/22  1335  Antecubital  less than 1            Intake/Output Last 24 hours No intake or output data in the 24 hours ending 07/24/22 1657  Labs/Imaging Results for orders placed or performed during the hospital encounter of 07/24/22 (from the past 48 hour(s))  Comprehensive metabolic panel     Status: Abnormal   Collection Time: 07/24/22  1:27 PM  Result Value Ref Range   Sodium 138 135 - 145 mmol/L   Potassium 4.4 3.5 - 5.1 mmol/L   Chloride 109 98 - 111 mmol/L   CO2 25 22 - 32 mmol/L   Glucose, Bld 109 (H) 70 - 99 mg/dL    Comment: Glucose reference range applies only to samples taken after fasting for at least 8 hours.   BUN 40 (H) 8 - 23 mg/dL   Creatinine, Ser 1.66 (H) 0.61 - 1.24 mg/dL   Calcium 8.7 (L) 8.9 - 10.3 mg/dL   Total Protein 5.8 (L) 6.5 - 8.1 g/dL   Albumin 3.5 3.5 - 5.0 g/dL  AST 20 15 - 41 U/L   ALT 12 0 - 44 U/L   Alkaline Phosphatase 52 38 - 126 U/L   Total Bilirubin 0.7 0.3 - 1.2 mg/dL   GFR, Estimated 40 (L) >60 mL/min    Comment: (NOTE) Calculated using the CKD-EPI Creatinine Equation (2021)    Anion gap 4 (L) 5 - 15    Comment: Performed at [facility] [address], [place], [place] [address]  CBC with Differential     Status: Abnormal   Collection Time: 07/24/22  1:27 PM  Result Value Ref Range   WBC 9.6 4.0 - 10.5 K/uL   RBC 4.01 (L) 4.22 - 5.81 MIL/uL   Hemoglobin 12.1 (L) 13.0 - 17.0 g/dL   HCT 37.6 (L) 39.0 - 52.0 %   MCV 93.8 80.0 - 100.0 fL   MCH 30.2 26.0 - 34.0 pg   MCHC 32.2 30.0 - 36.0 g/dL   RDW 13.2 11.5 - 15.5 %   Platelets 132 (L) 150 - 400 K/uL   nRBC  0.0 0.0 - 0.2 %   Neutrophils Relative % 74 %   Neutro Abs 7.0 1.7 - 7.7 K/uL   Lymphocytes Relative 17 %   Lymphs Abs 1.7 0.7 - 4.0 K/uL   Monocytes Relative 6 %   Monocytes Absolute 0.6 0.1 - 1.0 K/uL   Eosinophils Relative 3 %   Eosinophils Absolute 0.3 0.0 - 0.5 K/uL   Basophils Relative 0 %   Basophils Absolute 0.0 0.0 - 0.1 K/uL   Immature Granulocytes 0 %   Abs Immature Granulocytes 0.03 0.00 - 0.07 K/uL    Comment: Performed at [facility], [address] [place], [place] [address]  Protime-INR     Status: None   Collection Time: 07/24/22  1:27 PM  Result Value Ref Range   Prothrombin Time 14.0 11.4 - 15.2 seconds   INR 1.1 0.8 - 1.2    Comment: (NOTE) INR goal varies based on device and disease states. Performed at [facility], [place] [address] [place], [place] [address]   Type and screen [place]     Status: None   Collection Time: 07/24/22  1:36 PM  Result Value Ref Range   ABO/RH(D) O POS    Antibody Screen NEG    Sample Expiration      07/27/2022,2359 Performed at [facility], [place] [address], [place], [place] [address]   I-Stat Chem 8, ED     Status: Abnormal   Collection Time: 07/24/22  1:42 PM  Result Value Ref Range   Sodium 142 135 - 145 mmol/L   Potassium 4.5 3.5 - 5.1 mmol/L   Chloride 110 98 - 111 mmol/L   BUN 38 (H) 8 - 23 mg/dL   Creatinine, Ser 1.70 (H) 0.61 - 1.24 mg/dL   Glucose, Bld 101 (H) 70 - 99 mg/dL    Comment: Glucose reference range applies only to samples taken after fasting for at least 8 hours.   Calcium, Ion 1.17 1.15 - 1.40 mmol/L   TCO2 23 22 - 32 mmol/L   Hemoglobin 11.6 (L) 13.0 - 17.0 g/dL   HCT 34.0 (L) 39.0 - 52.0 %  CBC     Status: Abnormal   Collection Time: 07/24/22  4:04 PM  Result Value Ref Range   WBC 10.1 4.0 - 10.5 K/uL   RBC 3.58 (L) 4.22 - 5.81 MIL/uL   Hemoglobin 10.9 (L) 13.0 -  17.0 g/dL   HCT 33.2 (L) 39.0 - 52.0 %   MCV 92.7 80.0 - 100.0 fL   MCH 30.4 26.0 - 34.0  pg   MCHC 32.8 30.0 - 36.0 g/dL   RDW 13.2 11.5 - 15.5 %   Platelets 112 (L) 150 - 400 K/uL   nRBC 0.0 0.0 - 0.2 %    Comment: Performed at [facility] [address] [place], [place] [address]   DG Abd 1 View  Result Date: 07/24/2022 CLINICAL DATA:  Vomiting EXAM: ABDOMEN - 1 VIEW COMPARISON:  CT abdomen/pelvis 03/27/2016 FINDINGS: There is a nonobstructive bowel gas pattern. There is no definite free intraperitoneal air, within the confines of supine technique. There is no gross organomegaly. There is a possible left renal stone measuring 5-6 mm. There is no other abnormal soft tissue calcification There is no acute osseous abnormality. IMPRESSION: 1. Nonobstructive bowel gas pattern. 2. Possible left renal stone. Electronically Signed   By: Valetta Mole M.D.   On: 07/24/2022 15:02   DG Chest Portable 1 View  Result Date: 07/24/2022 CLINICAL DATA:  Vomiting EXAM: PORTABLE CHEST - 1 VIEW COMPARISON:  None Available. FINDINGS: Cardiomediastinal silhouette and pulmonary vasculature are within normal limits. Lungs are clear. IMPRESSION: No acute cardiopulmonary process. Electronically Signed   By: Miachel Roux M.D.   On: 07/24/2022 14:57    Pending Labs Unresulted Labs (From admission, onward)     Start     Ordered   07/25/22 [id number]  Basic metabolic panel  Tomorrow morning,   R        07/24/22 1543   07/24/22 1507  CBC  Now then every 4 hours,   R      07/24/22 1507            Vitals/Pain Today's Vitals   07/24/22 1600 07/24/22 1615 07/24/22 1630 07/24/22 1645  BP: (!) 138/107 130/87 98/80 115/77  Pulse: 86 93 100 86  Resp: (!) 21 (!) 23 (!) 21 (!) 23  Temp:      TempSrc:      SpO2: 97% 91% 99% 97%  Weight:      Height:      PainSc:        Isolation Precautions No active isolations  Medications Medications  sodium chloride 0.9 % bolus 1,000 mL (1,000 mLs Intravenous Bolus 07/24/22 1346)    And  0.9 %  sodium chloride infusion ( Intravenous New Bag/Given 07/24/22  1419)  pantoprozole (PROTONIX) 80 mg /NS 100 mL infusion (8 mg/hr Intravenous New Bag/Given 07/24/22 1418)  acetaminophen (TYLENOL) tablet 650 mg (has no administration in time range)    Or  acetaminophen (TYLENOL) suppository 650 mg (has no administration in time range)  ondansetron (ZOFRAN) tablet 4 mg (has no administration in time range)    Or  ondansetron (ZOFRAN) injection 4 mg (has no administration in time range)  pantoprazole (PROTONIX) 80 mg /NS 100 mL IVPB (0 mg Intravenous Stopped 07/24/22 1603)  ondansetron (ZOFRAN) injection 4 mg (4 mg Intravenous Given 07/24/22 1350)    Mobility walks with device     Focused Assessments Cardiac Assessment Handoff:    Lab Results  Component Value Date   CKTOTAL 45 06/14/2012   CKMB 2.5 06/14/2012   TROPONINI <0.03 08/07/2017   No results found for: "DDIMER" Does the Patient currently have chest pain? No    R Recommendations: See Admitting Provider Note  Report given to:   Additional Notes: pt has no hx  of GI bleed recently looks like was placed on protonix pt is unsure why denies ulcers drinking etc.  Had 1 episode of vomiting bright blood.  EMS stated it looked like 1000 ml of blood.  From looks of his clothing it was a lot and bright blood.  He has hx of old stroke with right side deficits slurred speech and droop is baseline

## 2022-07-24 NOTE — H&P (Signed)
History and Physical  Patient: William Patel [id number] DOB: 1934-05-03 DOA: 07/24/2022 DOS: the patient was seen and examined on 07/24/2022 Patient coming from: Home  Chief Complaint: Vomiting of blood HPI: William Patel is a 89 y.o. male with PMH significant of CVA on Plavix, HLD, chronic dysarthria and right-sided weakness after CVA, HTN present to the hospital with complaints of vomiting episode with blood. Patient reports only 1 episode of sudden onset of vomiting of bright red blood. No prior episodes of vomiting before this. No abdominal pain with the vomit. No fever no chills.  No diarrhea.  No bleeding anywhere else. No trauma reported by the patient. No change in medications. Patient is on Plavix but denies taking any medications like ibuprofen Aleve or naproxen or antacids. No alcohol abuse reported by the patient. No further episodes since arrival to the ER.  Review of Systems: As mentioned in the history of present illness. All other systems reviewed and are negative.  Prior to Admission medications   Medication Sig Start Date End Date Taking? Authorizing Provider  acetaminophen (TYLENOL) 325 MG tablet Take 650 mg by mouth every 4 (four) hours as needed for mild pain, fever or headache.    [provider]  atorvastatin (LIPITOR) 40 MG tablet Take 1 tablet (40 mg total) by mouth daily. 12/21/17   Frann Rider, NP  clobetasol cream (TEMOVATE) [id number] % Apply 1 application topically 2 (two) times daily. Breaking out in corners of mouth 08/30/20   [provider]  clopidogrel (PLAVIX) 75 MG tablet Take 1 tablet (75 mg total) by mouth daily. 06/23/18   Frann Rider, NP  meclizine (ANTIVERT) 12.5 MG tablet Take 12.5 mg by mouth 3 (three) times daily as needed (vertigo).    [provider]  OVER THE COUNTER MEDICATION Take 1 tablet by mouth daily.    [provider]    Past Medical History:  Diagnosis Date   Colon cancer ([place])    Left     Generalized headaches    Stroke [facility])    Visual disturbances    Past Surgical History:  Procedure Laterality Date   COLON RESECTION  09/21/2011   Procedure: COLON RESECTION LAPAROSCOPIC;  Surgeon: Pedro Earls, MD;  Location: [facility] ORS;  Service: General;  Laterality: N/A;      COLONOSCOPY  feb 2013   endo done also   Social History:  reports that he has quit smoking. His smoking use included cigars. His smokeless tobacco use includes chew. He reports current alcohol use of about 3.0 standard drinks of alcohol per week. He reports that he does not use drugs. Allergies  Allergen Reactions   Tramadol Other (See Comments)    Causes headaches   Family History  Problem Relation Age of Onset   Heart failure Mother    Brain cancer Father    Stroke Brother    Physical Exam: Vitals:   07/24/22 1615 07/24/22 1630 07/24/22 1645 07/24/22 1817  BP: 130/87 98/80 115/77 (!) 157/76  Pulse: 93 100 86 65  Resp: (!) 23 (!) 21 (!) 23 20  Temp:    98.5 F (36.9 C)  TempSrc:    Oral  SpO2: 91% 99% 97% 100%  Weight:      Height:       General: Appear in mild distress; no visible Abnormal Neck Mass Or lumps, Conjunctiva normal Cardiovascular: S1 and S2 Present, aortic systolic Murmur, Respiratory: good respiratory effort, Bilateral Air entry present and CTA, no Crackles, no  wheezes Abdomen: Bowel Sound present, Non tender  Extremities: no Pedal edema Neurology: alert and oriented to time, place, and person, right upper and lower extremity weakness which is chronic as well as dysarthria with facial droop. Gait not checked due to patient safety concerns   Data Reviewed: I have reviewed ED notes, Vitals, Lab results and outpatient records. Since last encounter, pertinent lab results CBC and BMP   . I have ordered test including CBC and BMP  . I have discussed pt's care plan and test results with CBC and BMP  .   Assessment and Plan Upper GI bleed Associated with Plavix. GI consulted. Last  dose of Plavix was on 2/22. EGD scheduled for tomorrow. Clear liquid diet for now. Transfuse for hemoglobin less than 8. Currently on Protonix drip which I will continue. Blood pressure is elevated.  Currently holding IV fluids. N.p.o. after midnight.  Chronic kidney disease, stage 3a (HCC) Baseline serum creatinine around 1.3.  Currently serum creatinine around 1.6.  Suspect this is mostly CKD rather than AKI. Will monitor.  Hypertension Blood pressure mildly elevated. Would not treat unless significantly elevated due to concern about risk for shock from bleeding.  Chronic diastolic CHF (congestive heart failure)  Not volume overloaded. Monitor.  History of cerebrovascular accident (CVA) with residual deficit Carotid stenosis HLD (hyperlipidemia) On Plavix at home.  Currently on hold. Patient has chronic dysarthria as well as right-sided weakness. Monitor. Continue statin.  Goals of care conversation. Patient originally wanted CPR but did not want any intubation. On further clarification as CPR without intubation would not successfully revived the patient the patient decided that he does not want CPR as well and would rather be full DNR. Patient would like to prefer to continue receive other medical treatments.  Advance Care Planning:   Code Status: DNR  Consults: GI Family Communication: None  Author: Berle Mull, MD 07/24/2022 6:32 PM For on call review [email or web address].

## 2022-07-25 ENCOUNTER — Encounter (HOSPITAL_COMMUNITY): Payer: Self-pay | Admitting: Internal Medicine

## 2022-07-25 ENCOUNTER — Encounter (HOSPITAL_COMMUNITY)
Admission: EM | Disposition: A | Discharge status: Home / Self Care | Payer: Self-pay | Source: Home / Self Care | Attending: Internal Medicine

## 2022-07-25 ENCOUNTER — Inpatient Hospital Stay (HOSPITAL_COMMUNITY): Payer: Medicare HMO | Admitting: Anesthesiology

## 2022-07-25 DIAGNOSIS — K2289 Other specified disease of esophagus: Secondary | ICD-10-CM

## 2022-07-25 DIAGNOSIS — Z87891 Personal history of nicotine dependence: Secondary | ICD-10-CM

## 2022-07-25 DIAGNOSIS — K922 Gastrointestinal hemorrhage, unspecified: Secondary | ICD-10-CM | POA: Diagnosis not present

## 2022-07-25 DIAGNOSIS — I110 Hypertensive heart disease with heart failure: Secondary | ICD-10-CM

## 2022-07-25 DIAGNOSIS — I509 Heart failure, unspecified: Secondary | ICD-10-CM | POA: Diagnosis not present

## 2022-07-25 DIAGNOSIS — K259 Gastric ulcer, unspecified as acute or chronic, without hemorrhage or perforation: Secondary | ICD-10-CM

## 2022-07-25 HISTORY — PX: ESOPHAGOGASTRODUODENOSCOPY (EGD) WITH PROPOFOL: SHX5813

## 2022-07-25 LAB — CBC
HCT: 27.6 % — ABNORMAL LOW (ref 39.0–52.0)
HCT: 33 % — ABNORMAL LOW (ref 39.0–52.0)
Hemoglobin: 9.3 g/dL — ABNORMAL LOW (ref 13.0–17.0)
Hemoglobin: 11.1 g/dL — ABNORMAL LOW (ref 13.0–17.0)
MCH: 30.6 pg (ref 26.0–34.0)
MCH: 30.8 pg (ref 26.0–34.0)
MCHC: 33.7 g/dL (ref 30.0–36.0)
MCHC: 33.6 g/dL (ref 30.0–36.0)
MCV: 90.8 fL (ref 80.0–100.0)
MCV: 91.7 fL (ref 80.0–100.0)
Platelets: 100 10*3/uL — ABNORMAL LOW (ref 150–400)
Platelets: 128 10*3/uL — ABNORMAL LOW (ref 150–400)
RBC: 3.04 MIL/uL — ABNORMAL LOW (ref 4.22–5.81)
RBC: 3.6 MIL/uL — ABNORMAL LOW (ref 4.22–5.81)
RDW: 13.3 % (ref 11.5–15.5)
RDW: 13.4 % (ref 11.5–15.5)
WBC: 5.8 10*3/uL (ref 4.0–10.5)
WBC: 6.5 10*3/uL (ref 4.0–10.5)
nRBC: 0 % (ref 0.0–0.2)
nRBC: 0 % (ref 0.0–0.2)

## 2022-07-25 LAB — BASIC METABOLIC PANEL
Anion gap: 5 (ref 5–15)
BUN: 35 mg/dL — ABNORMAL HIGH (ref 8–23)
CO2: 20 mmol/L — ABNORMAL LOW (ref 22–32)
Calcium: 8.1 mg/dL — ABNORMAL LOW (ref 8.9–10.3)
Chloride: 115 mmol/L — ABNORMAL HIGH (ref 98–111)
Creatinine, Ser: 1.47 mg/dL — ABNORMAL HIGH (ref 0.61–1.24)
GFR, Estimated: 46 mL/min — ABNORMAL LOW (ref >60)
Glucose, Bld: 83 mg/dL (ref 70–99)
Potassium: 3.6 mmol/L (ref 3.5–5.1)
Sodium: 140 mmol/L (ref 135–145)

## 2022-07-25 SURGERY — ESOPHAGOGASTRODUODENOSCOPY (EGD) WITH PROPOFOL
Anesthesia: Monitor Anesthesia Care

## 2022-07-25 MED ORDER — CLOPIDOGREL BISULFATE 75 MG PO TABS: 75.0000 mg | ORAL_TABLET | Freq: Every day | ORAL | 3 refills | Status: DC

## 2022-07-25 MED ORDER — PHENYLEPHRINE 80 MCG/ML (10ML) SYRINGE FOR IV PUSH (FOR BLOOD PRESSURE SUPPORT)
PREFILLED_SYRINGE | INTRAVENOUS | Status: DC | PRN
  Administered 2022-07-25: 80 ug via INTRAVENOUS
  Administered 2022-07-25: 160 ug via INTRAVENOUS

## 2022-07-25 MED ORDER — LIDOCAINE 2% (20 MG/ML) 5 ML SYRINGE
INTRAMUSCULAR | Status: DC | PRN
  Administered 2022-07-25: 40 mg via INTRAVENOUS

## 2022-07-25 MED ORDER — B COMPLEX-C PO TABS
1.0000 | ORAL_TABLET | Freq: Every day | ORAL | Status: DC
  Administered 2022-07-25: 1 via ORAL
  Filled 2022-07-25: qty 1

## 2022-07-25 MED ORDER — PANTOPRAZOLE SODIUM 40 MG PO TBEC: DELAYED_RELEASE_TABLET | ORAL | 0 refills | Status: DC

## 2022-07-25 MED ORDER — LIDOCAINE 2% (20 MG/ML) 5 ML SYRINGE: INTRAMUSCULAR | Status: DC | PRN

## 2022-07-25 MED ORDER — B COMPLEX-C PO TABS: 1.0000 | ORAL_TABLET | Freq: Every day | ORAL | 0 refills | Status: DC

## 2022-07-25 MED ORDER — FERROUS SULFATE 325 (65 FE) MG PO TABS: 325.0000 mg | ORAL_TABLET | Freq: Every day | ORAL | 0 refills | Status: AC

## 2022-07-25 MED ORDER — FERROUS SULFATE 325 (65 FE) MG PO TABS
325.0000 mg | ORAL_TABLET | Freq: Every day | ORAL | Status: DC
  Administered 2022-07-25: 325 mg via ORAL
  Filled 2022-07-25: qty 1

## 2022-07-25 MED ORDER — LACTATED RINGERS IV SOLN
INTRAVENOUS | Status: AC | PRN
  Administered 2022-07-25: 1000 mL via INTRAVENOUS

## 2022-07-25 MED ORDER — PROPOFOL 10 MG/ML IV BOLUS
INTRAVENOUS | Status: DC | PRN
  Administered 2022-07-25: 30 mg via INTRAVENOUS
  Administered 2022-07-25: 40 mg via INTRAVENOUS
  Administered 2022-07-25: 30 mg via INTRAVENOUS

## 2022-07-25 MED ORDER — SODIUM CHLORIDE 0.9 % IV SOLN: INTRAVENOUS | Status: DC

## 2022-07-25 MED ORDER — PANTOPRAZOLE SODIUM 40 MG PO TBEC
40.0000 mg | DELAYED_RELEASE_TABLET | Freq: Two times a day (BID) | ORAL | Status: DC
  Administered 2022-07-25: 40 mg via ORAL
  Filled 2022-07-25: qty 1

## 2022-07-25 SURGICAL SUPPLY — 15 items

## 2022-07-25 NOTE — Progress Notes (Signed)
Spoke with wife, Rodena Piety regarding upcoming procedure via phone call. Verified that she had been aware and educated on the EGD procedure for the patient and was giving verbal consent to two nurses via phone.

## 2022-07-25 NOTE — Anesthesia Postprocedure Evaluation (Signed)
Anesthesia Post Note  Patient: William Patel  Procedure(s) Performed: ESOPHAGOGASTRODUODENOSCOPY (EGD) WITH PROPOFOL     Patient location during evaluation: PACU Anesthesia Type: MAC Level of consciousness: awake and alert Pain management: pain level controlled Vital Signs Assessment: post-procedure vital signs reviewed and stable Respiratory status: spontaneous breathing, nonlabored ventilation, respiratory function stable and patient connected to nasal cannula oxygen Cardiovascular status: stable and blood pressure returned to baseline Postop Assessment: no apparent nausea or vomiting Anesthetic complications: no   No notable events documented.  Last Vitals:  Vitals:   07/25/22 1020 07/25/22 1030  BP:  119/75  Pulse:  (!) 59  Resp:  15  Temp:  36.9 C  SpO2: 100% 99%    Last Pain:  Vitals:   07/25/22 1030  TempSrc:   PainSc: 0-No pain                 Santa Lighter

## 2022-07-25 NOTE — Interval H&P Note (Signed)
History and Physical Interval Note: 88/male with hematemesis, on plavix?last dose yesterday,anemia and drop in Hb for an EGD with propofol.  07/25/2022 9:54 AM  William Patel  has presented today for EGD, with the diagnosis of GI bleed.  The various methods of treatment have been discussed with the patient and family. After consideration of risks, benefits and other options for treatment, the patient has consented to  Procedure(s): ESOPHAGOGASTRODUODENOSCOPY (EGD) WITH PROPOFOL (N/A) as a surgical intervention.  The patient's history has been reviewed, patient examined, no change in status, stable for surgery.  I have reviewed the patient's chart and labs.  Questions were answered to the patient's satisfaction.     Ronnette Juniper

## 2022-07-25 NOTE — Anesthesia Preprocedure Evaluation (Addendum)
Anesthesia Evaluation  Patient identified by MRN, date of birth, ID band Patient awake    Reviewed: Allergy & Precautions, NPO status , Patient's Chart, lab work & pertinent test results  Airway Mallampati: II  TM Distance: >3 FB Neck ROM: Full    Dental  (+) Dental Advisory Given, Edentulous Upper, Edentulous Lower   Pulmonary former smoker   Pulmonary exam normal breath sounds clear to auscultation       Cardiovascular hypertension, +CHF  Normal cardiovascular exam Rhythm:Regular Rate:Normal     Neuro/Psych  Headaches CVA    GI/Hepatic ,,,(+)     substance abuse  alcohol useGI bleed Colon caner   Endo/Other  negative endocrine ROS    Renal/GU Renal InsufficiencyRenal disease     Musculoskeletal negative musculoskeletal ROS (+)    Abdominal   Peds  Hematology  (+) Blood dyscrasia (Plavix)   Anesthesia Other Findings   Reproductive/Obstetrics                             Anesthesia Physical Anesthesia Plan  ASA: 3  Anesthesia Plan: MAC   Post-op Pain Management: Minimal or no pain anticipated   Induction: Intravenous  PONV Risk Score and Plan: 1 and TIVA  Airway Management Planned: Natural Airway and Simple Face Mask  Additional Equipment:   Intra-op Plan:   Post-operative Plan:   Informed Consent: I have reviewed the patients History and Physical, chart, labs and discussed the procedure including the risks, benefits and alternatives for the proposed anesthesia with the patient or authorized representative who has indicated his/her understanding and acceptance.   Patient has DNR.  Discussed DNR with patient and Suspend DNR.   Dental advisory given  Plan Discussed with: CRNA  Anesthesia Plan Comments:         Anesthesia Quick Evaluation

## 2022-07-25 NOTE — Op Note (Signed)
[facility] Patient Name: William Patel Procedure Date : 07/25/2022 MRN: [id number] Attending MD: Ronnette Juniper , MD, [id number] Date of Birth: [birth year] CSN: [id number] Age: 89 Admit Type: Inpatient Procedure:                Upper GI endoscopy Indications:              Hematemesis Providers:                Ronnette Juniper, MD, Vista Lawman, RN, Ladona Ridgel,                            Technician Referring MD:              Medicines:                See the Anesthesia note for documentation of the                            administered medications Complications:            No immediate complications. Estimated Blood Loss:     Estimated blood loss: none. Procedure:                Pre-Anesthesia Assessment:                           - Prior to the procedure, a History and Physical                            was performed, and patient medications and                            allergies were reviewed. The patient's tolerance of                            previous anesthesia was also reviewed. The risks                            and benefits of the procedure and the sedation                            options and risks were discussed with the patient.                            All questions were answered, and informed consent                            was obtained. Prior Anticoagulants: The patient has                            taken Plavix (clopidogrel), last dose was 1 day                            prior to procedure. ASA Grade Assessment: III - A  patient with severe systemic disease. After                            reviewing the risks and benefits, the patient was                            deemed in satisfactory condition to undergo the                            procedure.                           After obtaining informed consent, the endoscope was                            passed under direct vision. Throughout the                             procedure, the patient's blood pressure, pulse, and                            oxygen saturations were monitored continuously. The                            [id number] Olympus endoscope was introduced                            through the mouth, and advanced to the second part                            of duodenum. The upper GI endoscopy was                            accomplished without difficulty. The patient                            tolerated the procedure well. Scope In: Scope Out: Findings:      The upper third of the esophagus and middle third of the esophagus were       normal.      There were esophageal mucosal changes consistent with short-segment       Barrett's esophagus present in the lower third of the esophagus. The       maximum longitudinal extent of these mucosal changes was 1 cm in length.       Biopsy is contraindicated because as patient was on plavix.      One non-bleeding cratered gastric ulcer with a clean ulcer base (Forrest       Class III) was found in the gastric antrum. The lesion was 9 mm in       largest dimension. Biopsy is contraindicated because as patient was on       plavix.      The cardia and gastric fundus were normal on retroflexion.      The examined duodenum was normal. Impression:               - Normal upper third of esophagus  and middle third                            of esophagus.                           - Esophageal mucosal changes consistent with                            short-segment Barrett's esophagus. Biopsy is                            contraindicated.                           - Non-bleeding gastric ulcer with a clean ulcer                            base (Forrest Class III). Biopsy is contraindicated.                           - Normal examined duodenum.                           - No specimens collected. Recommendation:           - Resume regular diet.                           - PPI BID for 6 weeks, then  PPI once a day                            indefinitely.                           - Stool for H pylori Ag as biopsies were not taken,                            if positive, recommend treatment with antibioitics.                           - Due to age do not recommend repeat EGD for                            Barrett's Biopsies or dysplasia surveillance, but                            rather to take PPI indefinitely.                           - Avoid ASA and NSAIDs. Procedure Code(s):        --- Professional ---                           [phone], Esophagogastroduodenoscopy, flexible,  transoral; diagnostic, including collection of                            specimen(s) by brushing or washing, when performed                            (separate procedure) Diagnosis Code(s):        --- Professional ---                           K22.89, Other specified disease of esophagus                           K25.9, Gastric ulcer, unspecified as acute or                            chronic, without hemorrhage or perforation                           K92.0, Hematemesis CPT copyright 2022 [employer]. All rights reserved. The codes documented in this report are preliminary and upon coder review may  be revised to meet current compliance requirements. Ronnette Juniper, MD 07/25/2022 10:41:53 AM This report has been signed electronically. Number of Addenda: 0

## 2022-07-25 NOTE — Transfer of Care (Signed)
Immediate Anesthesia Transfer of Care Note  Patient: JAHVION HISLOP  Procedure(s) Performed: ESOPHAGOGASTRODUODENOSCOPY (EGD) WITH PROPOFOL  Patient Location: PACU  Anesthesia Type:MAC  Level of Consciousness: awake and patient cooperative  Airway & Oxygen Therapy: Patient Spontanous Breathing and Patient connected to nasal cannula oxygen  Post-op Assessment: Report given to RN and Post -op Vital signs reviewed and stable  Post vital signs: Reviewed and stable  Last Vitals:  Vitals Value Taken Time  BP    Temp    Pulse 66 07/25/22 1016  Resp 27 07/25/22 1016  SpO2 100 % 07/25/22 1016  Vitals shown include unvalidated device data.  Last Pain:  Vitals:   07/25/22 0944  TempSrc: Temporal  PainSc: 3          Complications: No notable events documented.

## 2022-07-25 NOTE — Anesthesia Procedure Notes (Signed)
Procedure Name: MAC Date/Time: 07/25/2022 9:55 AM  Performed by: Renato Shin, CRNAPre-anesthesia Checklist: Patient identified, Emergency Drugs available, Suction available and Patient being monitored Patient Re-evaluated:Patient Re-evaluated prior to induction Oxygen Delivery Method: Nasal cannula Preoxygenation: Pre-oxygenation with 100% oxygen Induction Type: IV induction Airway Equipment and Method: Bite block Placement Confirmation: positive ETCO2 and breath sounds checked- equal and bilateral Dental Injury: Teeth and Oropharynx as per pre-operative assessment

## 2022-07-27 ENCOUNTER — Encounter (HOSPITAL_COMMUNITY): Payer: Self-pay | Admitting: Gastroenterology

## 2022-07-27 NOTE — Discharge Summary (Signed)
Physician Discharge Summary   Patient: William Patel MRN: [id number] DOB: [birth year]  Admit date:     07/24/2022  Discharge date: 07/25/2022  Discharge Physician: Berle Mull  PCP: Leonard Downing, MD  Recommendations at discharge: Follow-up with PCP in 1 week with repeat CBC. Outpatient stool for H. pylori recommended. Follow-up with [facility] as needed.   Follow-up Information     Leonard Downing, MD. Schedule an appointment as soon as possible for a visit in 1 week(s).   Specialty: Family Medicine Why: reccommend to check Stool for H pylori Ag Contact information: [address] [place] [address] [phone]         [facility], [facility]. Call today.   Why: As needed Contact information: [place] [address] [phone]                Discharge Diagnoses: Principal Problem:   Upper GI bleed Active Problems:   Chronic kidney disease, stage 3a ([place])   Hypertension   Chronic diastolic CHF (congestive heart failure) ([place])   Carotid stenosis   HLD (hyperlipidemia)   History of cerebrovascular accident (CVA) with residual deficit  Assessment and Plan  Upper GI bleed Associated with Plavix. GI consulted. Last dose of Plavix was on 2/22. Received PRBC transfusion. Underwent EGD. Nonbleeding gastric ulcer with clean ulcer base was identified. Biopsy was not performed as the patient does receive Plavix before coming to the hospital. PPI twice daily for 6 weeks followed by PPI once a day recommended. No further EGD recommended given his risk. Stool for H. pylori outpatient recommended. Per GI can recommend Plavix after 5 days.   Chronic kidney disease, stage 3a (HCC) Baseline serum creatinine around 1.3.  Currently serum creatinine around 1.6.  Suspect this is mostly CKD rather than AKI. Will monitor.   Hypertension Blood pressure mildly elevated. Improved.   Chronic diastolic CHF (congestive heart  failure)  Not volume overloaded. Monitor.   History of cerebrovascular accident (CVA) with residual deficit Carotid stenosis HLD (hyperlipidemia) On Plavix at home.  Currently on hold.  Resume after 5 days. Patient has chronic dysarthria as well as right-sided weakness. Continue statin. Ambulating in the hallway independently.   Goals of care conversation. Patient originally wanted CPR but did not want any intubation. On further clarification as CPR without intubation would not successfully revived the patient the patient decided that he does not want CPR as well and would rather be full DNR. Patient would like to prefer to continue receive other medical treatments.   Consultants:  [facility]  Procedures performed:  EGD and PRBC transfusion  DISCHARGE MEDICATION: Allergies as of 07/25/2022       Reactions   Tramadol Other (See Comments)   Causes headaches        Medication List     STOP taking these medications    omeprazole 20 MG capsule Commonly known as: PRILOSEC       TAKE these medications    acetaminophen 325 MG tablet Commonly known as: TYLENOL Take 650 mg by mouth every 4 (four) hours as needed for mild pain, fever or headache.   atorvastatin 40 MG tablet Commonly known as: Lipitor Take 1 tablet (40 mg total) by mouth daily.   B-complex with vitamin C tablet Take 1 tablet by mouth daily.   clopidogrel 75 MG tablet Commonly known as: PLAVIX Take 1 tablet (75 mg total) by mouth daily. Start taking on: July 31, 2022 What changed:  These instructions start on July 31, 2022. If you are unsure what to do until then, ask your doctor or other care provider.   ferrous sulfate 325 (65 FE) MG tablet Take 1 tablet (325 mg total) by mouth daily with breakfast.   meclizine 12.5 MG tablet Commonly known as: ANTIVERT Take 12.5 mg by mouth 3 (three) times daily as needed (vertigo).   MULTIVITAMIN PO Take 1 tablet by mouth daily. Centrum   OVER THE COUNTER  MEDICATION Take 1 tablet by mouth daily. Prostate health vitamin   pantoprazole 40 MG tablet Commonly known as: PROTONIX Take 1 tablet (40 mg total) by mouth 2 (two) times daily before a meal for 42 days, THEN 1 tablet (40 mg total) daily. Start taking on: July 25, 2022       Disposition: Home Diet recommendation: Cardiac diet  Discharge Exam: Vitals:   07/25/22 0944 07/25/22 1015 07/25/22 1020 07/25/22 1030  BP: 121/79 105/61  119/75  Pulse:  63  (!) 59  Resp: 16 (!) 23  15  Temp: (!) 97.3 F (36.3 C) 97.8 F (36.6 C)  98.4 F (36.9 C)  TempSrc: Temporal Oral    SpO2: 96% 99% 100% 99%  Weight:      Height:       General: Appear in no distress; no visible Abnormal Neck Mass Or lumps, Conjunctiva normal Cardiovascular: S1 and S2 Present, no Murmur, Respiratory: good respiratory effort, Bilateral Air entry present and CTA, no Crackles, no wheezes Abdomen: Bowel Sound present, Non tender  Extremities: no Pedal edema Neurology: alert and oriented to time, place, and person chronic right-sided weakness with contracture Filed Weights   07/24/22 1323  Weight: 56.2 kg   Condition at discharge: stable  The results of significant diagnostics from this hospitalization (including imaging, microbiology, ancillary and laboratory) are listed below for reference.   Imaging Studies: DG Abd 1 View  Result Date: 07/24/2022 CLINICAL DATA:  Vomiting EXAM: ABDOMEN - 1 VIEW COMPARISON:  CT abdomen/pelvis 03/27/2016 FINDINGS: There is a nonobstructive bowel gas pattern. There is no definite free intraperitoneal air, within the confines of supine technique. There is no gross organomegaly. There is a possible left renal stone measuring 5-6 mm. There is no other abnormal soft tissue calcification There is no acute osseous abnormality. IMPRESSION: 1. Nonobstructive bowel gas pattern. 2. Possible left renal stone. Electronically Signed   By: Valetta Mole M.D.   On: 07/24/2022 15:02   DG Chest  Portable 1 View  Result Date: 07/24/2022 CLINICAL DATA:  Vomiting EXAM: PORTABLE CHEST - 1 VIEW COMPARISON:  None Available. FINDINGS: Cardiomediastinal silhouette and pulmonary vasculature are within normal limits. Lungs are clear. IMPRESSION: No acute cardiopulmonary process. Electronically Signed   By: Miachel Roux M.D.   On: 07/24/2022 14:57    Microbiology: Results for orders placed or performed during the hospital encounter of 09/20/20  Resp Panel by RT-PCR (Flu A&B, Covid) Nasopharyngeal Swab     Status: None   Collection Time: 09/20/20 11:21 AM   Specimen: Nasopharyngeal Swab; Nasopharyngeal(NP) swabs in vial transport medium  Result Value Ref Range Status   SARS Coronavirus 2 by RT PCR NEGATIVE NEGATIVE Final    Comment: (NOTE) SARS-CoV-2 target nucleic acids are NOT DETECTED.  The SARS-CoV-2 RNA is generally detectable in upper respiratory specimens during the acute phase of infection. The lowest concentration of SARS-CoV-2 viral copies this assay can detect is 138 copies/mL. A negative result does not preclude SARS-Cov-2 infection and should not be used as  the sole basis for treatment or other patient management decisions. A negative result may occur with  improper specimen collection/handling, submission of specimen other than nasopharyngeal swab, presence of viral mutation(s) within the areas targeted by this assay, and inadequate number of viral copies(<138 copies/mL). A negative result must be combined with clinical observations, patient history, and epidemiological information. The expected result is Negative.  Fact Sheet for Patients:  EntrepreneurPulse.com.au  Fact Sheet for Healthcare Providers:  IncredibleEmployment.be  This test is no t yet approved or cleared by the [place] FDA and  has been authorized for detection and/or diagnosis of SARS-CoV-2 by FDA under an Emergency Use Authorization (EUA). This EUA will remain   in effect (meaning this test can be used) for the duration of the COVID-19 declaration under Section 564(b)(1) of the Act, 21 U.S.C.section 360bbb-3(b)(1), unless the authorization is terminated  or revoked sooner.       Influenza A by PCR NEGATIVE NEGATIVE Final   Influenza B by PCR NEGATIVE NEGATIVE Final    Comment: (NOTE) The Xpert Xpress SARS-CoV-2/FLU/RSV plus assay is intended as an aid in the diagnosis of influenza from Nasopharyngeal swab specimens and should not be used as a sole basis for treatment. Nasal washings and aspirates are unacceptable for Xpert Xpress SARS-CoV-2/FLU/RSV testing.  Fact Sheet for Patients: EntrepreneurPulse.com.au  Fact Sheet for Healthcare Providers: IncredibleEmployment.be  This test is not yet approved or cleared by the [place] FDA and has been authorized for detection and/or diagnosis of SARS-CoV-2 by FDA under an Emergency Use Authorization (EUA). This EUA will remain in effect (meaning this test can be used) for the duration of the COVID-19 declaration under Section 564(b)(1) of the Act, 21 U.S.C. section 360bbb-3(b)(1), unless the authorization is terminated or revoked.  Performed at [facility], [address] [place],  [address]    Labs: CBC: Recent Labs  Lab 07/24/22 1327 07/24/22 1342 07/24/22 1604 07/24/22 2227 07/25/22 0323 07/25/22 1520  WBC 9.6  --  10.1 7.1 5.8 6.5  NEUTROABS 7.0  --   --   --   --   --   HGB 12.1* 11.6* 10.9* 10.1* 9.3* 11.1*  HCT 37.6* 34.0* 33.2* 30.5* 27.6* 33.0*  MCV 93.8  --  92.7 92.7 90.8 91.7  PLT 132*  --  112* 111* 100* 0000000*   Basic Metabolic Panel: Recent Labs  Lab 07/24/22 1327 07/24/22 1342 07/25/22 0323  NA 138 142 140  K 4.4 4.5 3.6  CL 109 110 115*  CO2 25  --  20*  GLUCOSE 109* 101* 83  BUN 40* 38* 35*  CREATININE 1.66* 1.70* 1.47*  CALCIUM 8.7*  --  8.1*   Liver Function Tests: Recent Labs   Lab 07/24/22 1327  AST 20  ALT 12  ALKPHOS 52  BILITOT 0.7  PROT 5.8*  ALBUMIN 3.5   CBG: No results for input(s): "GLUCAP" in the last 168 hours.  Discharge time spent: greater than 30 minutes.  Signed: Berle Mull, MD [facility] 07/25/2022

## 2023-02-07 IMAGING — DX DG CHEST 1V PORT
1 series · 1 of 1 positions shown · non-contrast
Comparison: 7671

CLINICAL DATA: Cough, shortness of breath

EXAM:
PORTABLE CHEST 1 VIEW

[chest ap]
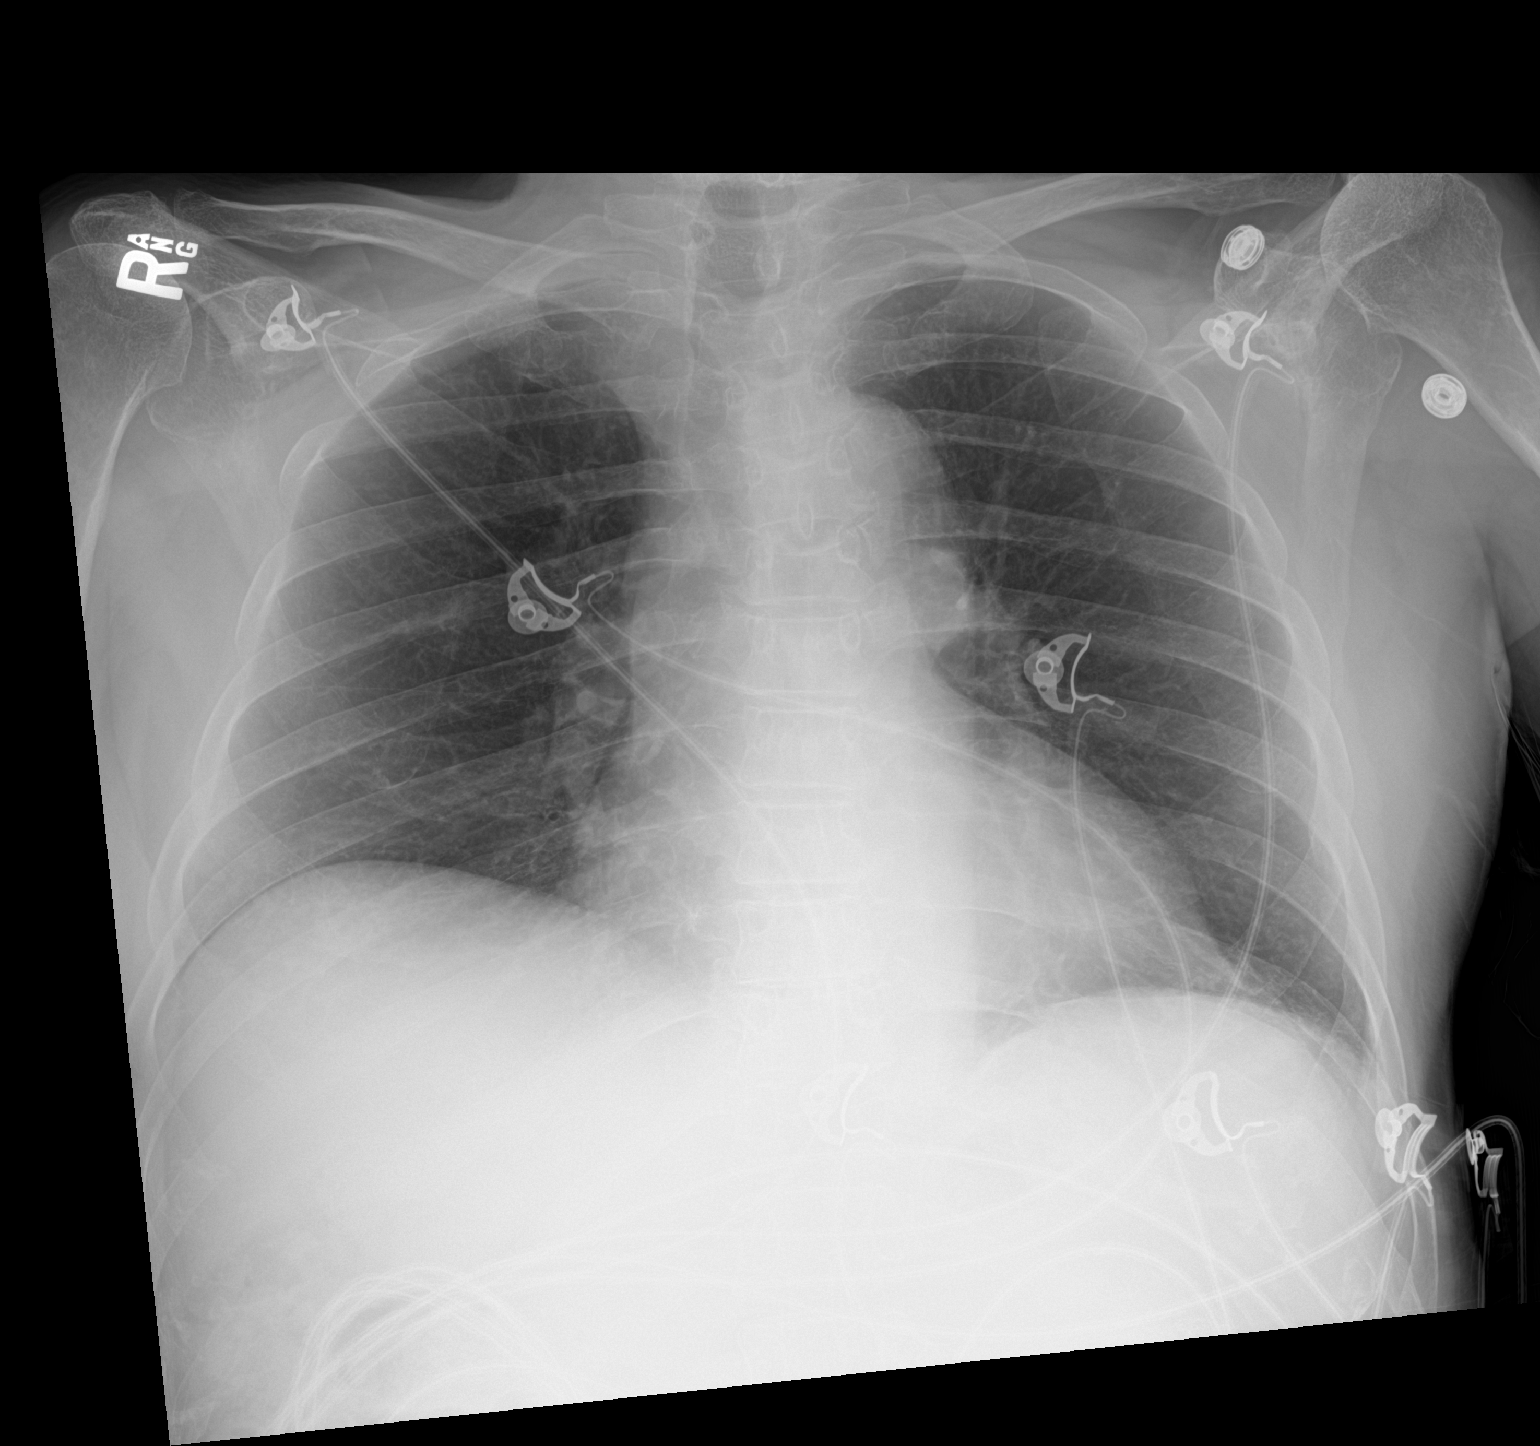

[1 of 1 positions shown; findings below may reference images not displayed]

FINDINGS: Shallow inspiration with low lung volumes. No consolidation or
edema. No pleural effusion or pneumothorax. Cardiomediastinal
contours are within normal limits for technique.
IMPRESSION: No acute process in the chest.

## 2023-02-15 ENCOUNTER — Observation Stay (HOSPITAL_COMMUNITY): Payer: Medicare HMO

## 2023-02-15 ENCOUNTER — Encounter (HOSPITAL_COMMUNITY): Payer: Self-pay

## 2023-02-15 ENCOUNTER — Other Ambulatory Visit: Payer: Self-pay

## 2023-02-15 ENCOUNTER — Observation Stay (HOSPITAL_COMMUNITY)
Admission: EM | Admit: 2023-02-15 | Discharge: 2023-02-16 | Disposition: A | Discharge status: Home / Self Care | Payer: Medicare HMO | Attending: Internal Medicine | Admitting: Internal Medicine

## 2023-02-15 DIAGNOSIS — K920 Hematemesis: Principal | ICD-10-CM | POA: Diagnosis present

## 2023-02-15 DIAGNOSIS — N1831 Chronic kidney disease, stage 3a: Secondary | ICD-10-CM | POA: Diagnosis present

## 2023-02-15 DIAGNOSIS — N1832 Chronic kidney disease, stage 3b: Secondary | ICD-10-CM | POA: Insufficient documentation

## 2023-02-15 DIAGNOSIS — K5710 Diverticulosis of small intestine without perforation or abscess without bleeding: Secondary | ICD-10-CM | POA: Diagnosis not present

## 2023-02-15 DIAGNOSIS — R1084 Generalized abdominal pain: Secondary | ICD-10-CM

## 2023-02-15 DIAGNOSIS — I6930 Unspecified sequelae of cerebral infarction: Secondary | ICD-10-CM

## 2023-02-15 DIAGNOSIS — Z7902 Long term (current) use of antithrombotics/antiplatelets: Secondary | ICD-10-CM | POA: Diagnosis not present

## 2023-02-15 DIAGNOSIS — Z8673 Personal history of transient ischemic attack (TIA), and cerebral infarction without residual deficits: Secondary | ICD-10-CM | POA: Diagnosis not present

## 2023-02-15 DIAGNOSIS — Z87891 Personal history of nicotine dependence: Secondary | ICD-10-CM | POA: Insufficient documentation

## 2023-02-15 DIAGNOSIS — K922 Gastrointestinal hemorrhage, unspecified: Principal | ICD-10-CM | POA: Insufficient documentation

## 2023-02-15 DIAGNOSIS — Z79899 Other long term (current) drug therapy: Secondary | ICD-10-CM | POA: Insufficient documentation

## 2023-02-15 DIAGNOSIS — D62 Acute posthemorrhagic anemia: Secondary | ICD-10-CM | POA: Insufficient documentation

## 2023-02-15 DIAGNOSIS — K2210 Ulcer of esophagus without bleeding: Secondary | ICD-10-CM | POA: Diagnosis not present

## 2023-02-15 DIAGNOSIS — Z85038 Personal history of other malignant neoplasm of large intestine: Secondary | ICD-10-CM | POA: Insufficient documentation

## 2023-02-15 DIAGNOSIS — K279 Peptic ulcer, site unspecified, unspecified as acute or chronic, without hemorrhage or perforation: Secondary | ICD-10-CM | POA: Insufficient documentation

## 2023-02-15 LAB — COMPREHENSIVE METABOLIC PANEL
ALT: 18 U/L (ref 0–44)
AST: 28 U/L (ref 15–41)
Albumin: 3.7 g/dL (ref 3.5–5.0)
Alkaline Phosphatase: 60 U/L (ref 38–126)
Anion gap: 13 (ref 5–15)
BUN: 44 mg/dL — ABNORMAL HIGH (ref 8–23)
CO2: 25 mmol/L (ref 22–32)
Calcium: 10.1 mg/dL (ref 8.9–10.3)
Chloride: 103 mmol/L (ref 98–111)
Creatinine, Ser: 1.71 mg/dL — ABNORMAL HIGH (ref 0.61–1.24)
GFR, Estimated: 38 mL/min — ABNORMAL LOW (ref >60)
Glucose, Bld: 105 mg/dL — ABNORMAL HIGH (ref 70–99)
Potassium: 4.2 mmol/L (ref 3.5–5.1)
Sodium: 141 mmol/L (ref 135–145)
Total Bilirubin: 0.8 mg/dL (ref 0.3–1.2)
Total Protein: 6.4 g/dL — ABNORMAL LOW (ref 6.5–8.1)

## 2023-02-15 LAB — TYPE AND SCREEN
ABO/RH(D): O POS
Antibody Screen: NEGATIVE

## 2023-02-15 LAB — CBC WITH DIFFERENTIAL/PLATELET
Abs Immature Granulocytes: 0.05 10*3/uL (ref 0.00–0.07)
Basophils Absolute: 0.1 10*3/uL (ref 0.0–0.1)
Basophils Relative: 0 %
Eosinophils Absolute: 0.1 10*3/uL (ref 0.0–0.5)
Eosinophils Relative: 1 %
HCT: 38.1 % — ABNORMAL LOW (ref 39.0–52.0)
Hemoglobin: 12.9 g/dL — ABNORMAL LOW (ref 13.0–17.0)
Immature Granulocytes: 0 %
Lymphocytes Relative: 15 %
Lymphs Abs: 1.9 10*3/uL (ref 0.7–4.0)
MCH: 31.9 pg (ref 26.0–34.0)
MCHC: 33.9 g/dL (ref 30.0–36.0)
MCV: 94.1 fL (ref 80.0–100.0)
Monocytes Absolute: 0.8 10*3/uL (ref 0.1–1.0)
Monocytes Relative: 7 %
Neutro Abs: 9.2 10*3/uL — ABNORMAL HIGH (ref 1.7–7.7)
Neutrophils Relative %: 77 %
Platelets: 185 10*3/uL (ref 150–400)
RBC: 4.05 MIL/uL — ABNORMAL LOW (ref 4.22–5.81)
RDW: 13.7 % (ref 11.5–15.5)
WBC: 12.1 10*3/uL — ABNORMAL HIGH (ref 4.0–10.5)
nRBC: 0 % (ref 0.0–0.2)

## 2023-02-15 LAB — HEMOGLOBIN AND HEMATOCRIT, BLOOD
HCT: 28.4 % — ABNORMAL LOW (ref 39.0–52.0)
Hemoglobin: 9.2 g/dL — ABNORMAL LOW (ref 13.0–17.0)

## 2023-02-15 LAB — POC OCCULT BLOOD, ED: Fecal Occult Bld: POSITIVE — AB

## 2023-02-15 LAB — LIPASE, BLOOD: Lipase: 30 U/L (ref 11–51)

## 2023-02-15 MED ORDER — LACTATED RINGERS IV BOLUS
1000.0000 mL | Freq: Once | INTRAVENOUS | Status: AC
  Administered 2023-02-15: 1000 mL via INTRAVENOUS

## 2023-02-15 MED ORDER — ACETAMINOPHEN 650 MG RE SUPP: 650.0000 mg | Freq: Four times a day (QID) | RECTAL | Status: DC | PRN

## 2023-02-15 MED ORDER — IOHEXOL 350 MG/ML SOLN
75.0000 mL | Freq: Once | INTRAVENOUS | Status: AC | PRN
  Administered 2023-02-15: 75 mL via INTRAVENOUS

## 2023-02-15 MED ORDER — PANTOPRAZOLE SODIUM 40 MG IV SOLR: 40.0000 mg | Freq: Two times a day (BID) | INTRAVENOUS | Status: DC

## 2023-02-15 MED ORDER — PANTOPRAZOLE 80MG IVPB - SIMPLE MED
80.0000 mg | Freq: Once | INTRAVENOUS | Status: AC
  Administered 2023-02-15: 80 mg via INTRAVENOUS
  Filled 2023-02-15: qty 100

## 2023-02-15 MED ORDER — PANTOPRAZOLE INFUSION (NEW) - SIMPLE MED
8.0000 mg/h | INTRAVENOUS | Status: DC
  Administered 2023-02-15 – 2023-02-16 (×3): 8 mg/h via INTRAVENOUS
  Filled 2023-02-15 (×4): qty 100

## 2023-02-15 MED ORDER — ADULT MULTIVITAMIN W/MINERALS CH
1.0000 | ORAL_TABLET | Freq: Every day | ORAL | Status: DC
  Administered 2023-02-15: 1 via ORAL
  Filled 2023-02-15: qty 1

## 2023-02-15 MED ORDER — ACETAMINOPHEN 325 MG PO TABS: 650.0000 mg | ORAL_TABLET | Freq: Four times a day (QID) | ORAL | Status: DC | PRN

## 2023-02-15 MED ORDER — ATORVASTATIN CALCIUM 40 MG PO TABS
40.0000 mg | ORAL_TABLET | Freq: Every day | ORAL | Status: DC
  Administered 2023-02-15: 40 mg via ORAL
  Filled 2023-02-15: qty 1

## 2023-02-15 NOTE — Hospital Course (Signed)
Hematemesis starting this morning. Dark/Coffee grounds. 1 quart.   Denies fevers, change in stool habits, unexplained weight loss Endorses: occasional chills, hematuria, "belly pain", cough  Lives at home with wife who helps with ADL's/iADLS  No Alcohol, chews tobacco sometimes, no other substances

## 2023-02-15 NOTE — ED Notes (Signed)
ED TO INPATIENT HANDOFF REPORT  ED Nurse Name and Phone #: Arline Asp, RN [phone]  S Name/Age/Gender William Patel 89 y.o. male Room/Bed: 022C/022C  Code Status   Code Status: Prior  Home/SNF/Other Home Patient oriented to: self, place, time, and situation Is this baseline? Yes   Triage Complete: Triage complete  Chief Complaint vomiting blood  Triage Note Pt present to ED from home with c/o vomiting coffee ground substance, abdominal pain. States hx of ulcer. Describes abdominal pain cramping in nature. Pt also c/o dizziness and right leg pain. Pt A&Ox3 at this time.  EMS VS: 124/81 85 97% room air 124 CBG   Allergies Allergies  Allergen Reactions   Tramadol Other (See Comments)    Causes headaches    Level of Care/Admitting Diagnosis ED Disposition     ED Disposition  Admit   Condition  --   Comment  The patient appears reasonably stabilized for admission considering the current resources, flow, and capabilities available in the ED at this time, and I doubt any other [facility] requiring further screening and/or treatment in the ED prior to admission is  present.          B Medical/Surgery History Past Medical History:  Diagnosis Date   Colon cancer (HCC)    Left    Generalized headaches    Stroke [facility])    Visual disturbances    Past Surgical History:  Procedure Laterality Date   COLON RESECTION  09/21/2011   Procedure: COLON RESECTION LAPAROSCOPIC;  Surgeon: Valarie Merino, MD;  Location: [facility] ORS;  Service: General;  Laterality: N/A;      COLONOSCOPY  feb 2013   endo done also   ESOPHAGOGASTRODUODENOSCOPY (EGD) WITH PROPOFOL N/A 07/25/2022   Procedure: ESOPHAGOGASTRODUODENOSCOPY (EGD) WITH PROPOFOL;  Surgeon: Kerin Salen, MD;  Location: [facility] ENDOSCOPY;  Service: Gastroenterology;  Laterality: N/A;     A IV Location/Drains/Wounds Patient Lines/Drains/Airways Status     Active Line/Drains/Airways     Name Placement date Placement time Site Days    Peripheral IV 02/15/23 20 G 1" Left Antecubital 02/15/23  1100  Antecubital  less than 1   Peripheral IV 02/15/23 20 G 1" Left;Posterior Hand 02/15/23  1205  Hand  less than 1            Intake/Output Last 24 hours  Intake/Output Summary (Last 24 hours) at 02/15/2023 1428 Last data filed at 02/15/2023 1327 Gross per 24 hour  Intake 102.65 ml  Output --  Net 102.65 ml    Labs/Imaging Results for orders placed or performed during the hospital encounter of 02/15/23 (from the past 48 hour(s))  CBC with Differential     Status: Abnormal   Collection Time: 02/15/23 11:45 AM  Result Value Ref Range   WBC 12.1 (H) 4.0 - 10.5 K/uL   RBC 4.05 (L) 4.22 - 5.81 MIL/uL   Hemoglobin 12.9 (L) 13.0 - 17.0 g/dL   HCT 69.6 (L) 29.5 - 28.4 %   MCV 94.1 80.0 - 100.0 fL   MCH 31.9 26.0 - 34.0 pg   MCHC 33.9 30.0 - 36.0 g/dL   RDW 13.2 44.0 - 10.2 %   Platelets 185 150 - 400 K/uL   nRBC 0.0 0.0 - 0.2 %   Neutrophils Relative % 77 %   Neutro Abs 9.2 (H) 1.7 - 7.7 K/uL   Lymphocytes Relative 15 %   Lymphs Abs 1.9 0.7 - 4.0 K/uL   Monocytes Relative 7 %   Monocytes Absolute 0.8  0.1 - 1.0 K/uL   Eosinophils Relative 1 %   Eosinophils Absolute 0.1 0.0 - 0.5 K/uL   Basophils Relative 0 %   Basophils Absolute 0.1 0.0 - 0.1 K/uL   Immature Granulocytes 0 %   Abs Immature Granulocytes 0.05 0.00 - 0.07 K/uL    Comment: Performed at [facility], [address], [place], [place] [address]  Comprehensive metabolic panel     Status: Abnormal   Collection Time: 02/15/23 11:45 AM  Result Value Ref Range   Sodium 141 135 - 145 mmol/L   Potassium 4.2 3.5 - 5.1 mmol/L   Chloride 103 98 - 111 mmol/L   CO2 25 22 - 32 mmol/L   Glucose, Bld 105 (H) 70 - 99 mg/dL    Comment: Glucose reference range applies only to samples taken after fasting for at least 8 hours.   BUN 44 (H) 8 - 23 mg/dL   Creatinine, Ser 1.91 (H) 0.61 - 1.24 mg/dL   Calcium 47.8 8.9 - 29.5 mg/dL   Total Protein 6.4 (L) 6.5 - 8.1  g/dL   Albumin 3.7 3.5 - 5.0 g/dL   AST 28 15 - 41 U/L   ALT 18 0 - 44 U/L   Alkaline Phosphatase 60 38 - 126 U/L   Total Bilirubin 0.8 0.3 - 1.2 mg/dL   GFR, Estimated 38 (L) >60 mL/min    Comment: (NOTE) Calculated using the CKD-EPI Creatinine Equation (2021)    Anion gap 13 5 - 15    Comment: Performed at [facility], [address] [place], [place] [address]  Lipase, blood     Status: None   Collection Time: 02/15/23 11:45 AM  Result Value Ref Range   Lipase 30 11 - 51 U/L    Comment: Performed at [facility], [address] [place], [place] [address]  Type and screen [facility]     Status: None   Collection Time: 02/15/23 11:45 AM  Result Value Ref Range   ABO/RH(D) O POS    Antibody Screen NEG    Sample Expiration      02/18/2023,2359 Performed at [facility], [address] [place], [place] [address]   POC occult blood, ED Provider will collect     Status: Abnormal   Collection Time: 02/15/23  1:30 PM  Result Value Ref Range   Fecal Occult Bld POSITIVE (A) NEGATIVE   No results found.  Pending Labs Unresulted Labs (From admission, onward)     Start     Ordered   02/15/23 1245  Occult blood card to lab, stool RN will collect  Once,   URGENT       Question Answer Comment  Specimen to be collected by: RN will collect   Release to patient Immediate      02/15/23 1245            Vitals/Pain Today's Vitals   02/15/23 1130 02/15/23 1200 02/15/23 1230 02/15/23 1300  BP: (!) 135/91 134/87 131/80 127/77  Pulse: 86 91 86 82  Resp: 20 20 17 18   Temp:      TempSrc:      SpO2: 100% 100% 100% 100%  Weight:      Height:      PainSc:        Isolation Precautions No active isolations  Medications Medications  pantoprozole (PROTONIX) 80 mg /NS 100 mL infusion (8 mg/hr Intravenous New Bag/Given 02/15/23 1232)  pantoprazole (  PROTONIX) injection 40 mg (has no administration in time range)  pantoprazole (PROTONIX) 80 mg  /NS 100 mL IVPB (0 mg Intravenous Stopped 02/15/23 1327)  lactated ringers bolus 1,000 mL (1,000 mLs Intravenous Bolus 02/15/23 1204)    Mobility walks     Focused Assessments GI   R Recommendations: See Admitting Provider Note  Report given to:   Additional Notes:  speech slurred s/p CVA at baseline

## 2023-02-15 NOTE — ED Triage Notes (Signed)
Pt present to ED from home with c/o vomiting coffee ground substance, abdominal pain. States hx of ulcer. Describes abdominal pain cramping in nature. Pt also c/o dizziness and right leg pain. Pt A&Ox3 at this time.  EMS VS: 124/81 85 97% room air 124 CBG

## 2023-02-15 NOTE — H&P (Signed)
Date: 02/15/2023               Patient Name:  William Patel MRN: [id number]  DOB: 04/12/1934 Age / Sex: 89 y.o., male   PCP: Kaleen Mask, MD         Medical Service: Internal Medicine Teaching Service         Attending Physician: Dr. Reymundo Poll, MD      First Contact: Dr. Meryl Dare, MD Pager: [phone]    Second Contact: Dr. Marrianne Mood, MD Pager [phone]         After Hours (After 5p/  First Contact Pager: [phone]  weekends / holidays): Second Contact Pager: [phone]   SUBJECTIVE   Chief Complaint: coffee ground emesis  History of Present Illness:  Due to dysarthria, difficult to understand open ended questions to the patient, and only able to understand several words at a time and yes/no. Pt reports hematemesis that started this morning. He reports that is dark and endorses it looking like coffee grounds. In total, he vomited roughly 1 quart. Due to this and pain in his esophagus and "feeling like he was going to pass out," he came to the hospital. He reports this presentation is very similar to his presentation in February. He reports pain in his stomach and throat but unable to elaborate on the timeline of this pain.  He endorses history of diverticulitis which is worsened by ingesting vegetables.  He denies pain with eating or postprandial. He denies having GI surgeries in the past. He denies diarrhea, constipation, hematochezia, and melana. He endorses being on plavix and not being on any other blood thinners including aspirin, eliquis, zarelto, or coumadin.   Per chart review on Feb 2024 admission, pt had an upper GI bleed. Received EGD and etiology was presumed to plavix and nonbleeding gastric ulcer. Pt had stopped plavix prior to presentation and presumed that gastric ulcer stopped bleeding due to cessation of plavix and thus was not bleeding on EGD. Recommendation to start PPI twice daily for 6 weeks followed by PPI once daily and recommendation to  hold plavix for 5 days.   Pt has significant dysarthria and RUE weakness/rigidity which he reports is his baseline since having a stroke in 2019. He reports no worsening of these symptoms, no new focal weakness, no changes in sensation, and no vision changes. He reports adherence to plavix for stroke prevention.   Unrelated to cc, pt reports having an object in the right eye. He reports that this sensation has been present after he had a stroke back in 2019. He reports that he has not seen an eye doctor in the past for this issue.   Regarding goals of care, he denies wanting to receive CPR or be intubated. In the event he does not have capacity to a medical decision, he identified his spouse has his surrogate decision maker. He is open to all other medical interventions. These wishes are consistent with goals of care discussions in Feb.   Review of Systems  Constitutional:  Negative for chills, fever and weight loss.  HENT:         Positive throat pain  Respiratory:  Negative for shortness of breath.        Positive for subacute/chronic cough  Cardiovascular:  Negative for chest pain.  Gastrointestinal:  Positive for heartburn, nausea and vomiting. Negative for abdominal pain, blood in stool, constipation, diarrhea and melena.       Endorse "stomach pain"  Genitourinary:  Positive for hematuria. Negative for flank pain.  Neurological:  Negative for speech change, focal weakness and weakness.       Positive for presyncope on admission but not now.     ED Course: Came in hypotensive but responded to LR 1L bolus with BP of 110/75. On protonox drip. CT angio bleeding ordered and pending. EDP spoke to Dr. Marca Ancona, GI following this patient, who agreed to see patient.   Past Medical History Past Medical History:  Diagnosis Date   Colon cancer (HCC)    Left    Generalized headaches    Stroke (HCC)    Visual disturbances      Meds:  Current Meds  Medication Sig   atorvastatin (LIPITOR) 40  MG tablet Take 1 tablet (40 mg total) by mouth daily.   clopidogrel (PLAVIX) 75 MG tablet Take 1 tablet (75 mg total) by mouth daily.   ferrous sulfate 325 (65 FE) MG tablet Take 1 tablet (325 mg total) by mouth daily with breakfast.   meclizine (ANTIVERT) 12.5 MG tablet Take 12.5 mg by mouth 3 (three) times daily as needed (vertigo).   pantoprazole (PROTONIX) 40 MG tablet Take 1 tablet (40 mg total) by mouth 2 (two) times daily before a meal for 42 days, THEN 1 tablet (40 mg total) daily.    Past Surgical History  Past Surgical History:  Procedure Laterality Date   COLON RESECTION  09/21/2011   Procedure: COLON RESECTION LAPAROSCOPIC;  Surgeon: Valarie Merino, MD;  Location: [facility];  Service: General;  Laterality: N/A;      COLONOSCOPY  feb 2013   endo done also   ESOPHAGOGASTRODUODENOSCOPY (EGD) WITH PROPOFOL N/A 07/25/2022   Procedure: ESOPHAGOGASTRODUODENOSCOPY (EGD) WITH PROPOFOL;  Surgeon: Kerin Salen, MD;  Location: [facility] END[facility]COPY;  Service: Gastroenterology;  Laterality: N/A;    Social:  Lives With: house with spouse and son Occupation: retired Support: spouse and son Level of Function: support with iADLs with son. No support for ADLs PCP: Dr. Jeannetta Nap Substances: denies current alcohol use during interview but chart says "3 beers" per nursing eval. Former smoker difficult to understand pack year history. No other substance use.   Family History: brain cancer, HF, MI, and stroke in first degree relatives.   Allergies: Allergies as of 02/15/2023 - Review Complete 02/15/2023  Allergen Reaction Noted   Tramadol Other (See Comments) 09/03/2011    OBJECTIVE:   Physical Exam: Blood pressure 127/77, pulse 82, temperature 98.3 F (36.8 C), temperature source Oral, resp. rate 18, height 5\' 2"  (1.575 m), weight 56 kg, SpO2 100%.  Constitutional: some what ill appearing older man, laying flat in bed, but in no acute distress HENT: normocephalic atraumatic, mucous membranes  moist Eyes: conjunctiva non-erythematous. Small lesion noted on lateral sclera of R eye.  Neck: supple Cardiovascular: regular rate and rhythm, no m/r/g Pulmonary/Chest: normal work of breathing on room air, scattered wheezes and low pitch upper airway noises.  Abdominal: soft, non-tender, non-distended MSK: normal bulk and tone Neurological: alert & oriented x 3, 5/5 strength in LUE and BLE, 3/5 normal gait Skin: warm and dry  Labs: CBC    Component Value Date/Time   WBC 12.1 (H) 02/15/2023 1145   RBC 4.05 (L) 02/15/2023 1145   HGB 12.9 (L) 02/15/2023 1145   HCT 38.1 (L) 02/15/2023 1145   HCT 45.2 08/06/2017 2008   PLT 185 02/15/2023 1145   MCV 94.1 02/15/2023 1145   MCH 31.9 02/15/2023 1145   MCHC 33.9 02/15/2023 1145  RDW 13.7 02/15/2023 1145   LYMPHSABS 1.9 02/15/2023 1145   MONOABS 0.8 02/15/2023 1145   EOSABS 0.1 02/15/2023 1145   BASOSABS 0.1 02/15/2023 1145     CMP     Component Value Date/Time   NA 141 02/15/2023 1145   K 4.2 02/15/2023 1145   CL 103 02/15/2023 1145   CO2 25 02/15/2023 1145   GLUCOSE 105 (H) 02/15/2023 1145   BUN 44 (H) 02/15/2023 1145   CREATININE 1.71 (H) 02/15/2023 1145   CALCIUM 10.1 02/15/2023 1145   PROT 6.4 (L) 02/15/2023 1145   ALBUMIN 3.7 02/15/2023 1145   AST 28 02/15/2023 1145   ALT 18 02/15/2023 1145   ALKPHOS 60 02/15/2023 1145   BILITOT 0.8 02/15/2023 1145   GFRNONAA 38 (L) 02/15/2023 1145   GFRAA 59 (L) 08/10/2017 0359   UA pending APTT pending  PT-INR pending FOBT positive Lipase normal 30  Imaging: CT angio GI bleed: pending  EKG: personally reviewed my interpretation is NSR with possible L atrial enlargement. Unchanged from prior EKG  ASSESSMENT & PLAN:   Assessment & Plan by Problem: Principal Problem:   Hematemesis Active Problems:   CKD stage 3b, GFR 30-44 ml/min (HCC)   RASTUS NEIDHART is a 89 y.o. person living with a history of colon cancer s/p resection, gastric ulcers, and stroke w/ residual  dsyarthria and RUE weakness who presented with coffee ground hematosis and admitted for acute upper GI bleed and hypovolemia on hospital day 0  #Acute upper GI bleed #Hypovolemia Hx in Feb 2024 of coffee ground hematemesis presumed secondary to gastric ulcers and plavix, responded to PPI twice daily for 6 wks and then once daily PPI and 5 day holiday from plavix. Hemodynamically stable with bolus x2.  GI following, Dr. Marca Ancona, plan for EGD tmrw 1000 CT angio GI bleed pending Stopped home pantoprazone, started pantoprazole IV NPO  Holding plavix  Resuscitation with LR 1L bolus x2 Type and screen  Iron-deficiency anemia Holding ferrous sulfate, Hgb stable  Hyperlipidemia Restarted atorvastatin  Hx of stroke Symptoms at baseline, no recrudesce of symptoms or new focal neurologic symptoms.   Diet: NPO VTE: SCDs IVF: LR,Bolus 1L x2 Code:  DNR,DNI  Prior to Admission Living Arrangement: Home, living spouse and son Anticipated Discharge Location: Home Barriers to Discharge: hemodynamically stability, stabilize GI bleed  Dispo: Admit patient to Observation with expected length of stay less than 2 midnights.  Signed: Meryl Dare, MD PGY-1 Psychiatry Resident 02/15/2023, 4:50 PM   On weekends or after 5pm please page on call intern or resident: First contact: [phone] If no answer in 15 minutes, please contact senior pager at [phone]

## 2023-02-15 NOTE — ED Provider Notes (Signed)
[place] EMERGENCY DEPARTMENT AT [facility] Provider Note   CSN: [id number] Arrival date & time: 02/15/23  1036     History  Chief Complaint  Patient presents with   Hematemesis   Abdominal Pain    William Patel is a 89 y.o. male.  Patient complains of vomiting blood today.  Patient reports that he filled a bag at home with vomit and blood.  Patient reports he has had a history of similar in the past.  Patient has a past medical history of a CVA and has weakness in his right arm.  Patient reports he is not having any new areas of weakness.  Patient complains of discomfort in his throat and his upper abdomen.  Patient has somewhat of a speech impairment.  He is difficult to understand due to previous CVA.  He is able to communicate slowly.  Patient denies any diarrhea he does not recall having any black or tarry looking stools.  Patient has been seen by GI in the past.  Patient had a admission with an endoscopy in February of this year.  The history is provided by the patient. No language interpreter was used.  Abdominal Pain Pain location:  Generalized Pain quality: aching   Pain radiates to:  Does not radiate Pain severity:  Moderate Onset quality:  Gradual Timing:  Constant Progression:  Worsening Chronicity:  New Context: not recent illness   Relieved by:  Nothing Worsened by:  Nothing Ineffective treatments:  None tried Associated symptoms: vomiting   Associated symptoms: no cough   Risk factors: being elderly   Risk factors: no aspirin use        Home Medications Prior to Admission medications   Medication Sig Start Date End Date Taking? Authorizing Provider  acetaminophen (TYLENOL) 325 MG tablet Take 650 mg by mouth every 4 (four) hours as needed for mild pain, fever or headache.    [provider]  atorvastatin (LIPITOR) 40 MG tablet Take 1 tablet (40 mg total) by mouth daily. 12/21/17   Ihor Austin, NP  B Complex-C (B-COMPLEX WITH  VITAMIN C) tablet Take 1 tablet by mouth daily. 07/25/22   Rolly Salter, MD  clopidogrel (PLAVIX) 75 MG tablet Take 1 tablet (75 mg total) by mouth daily. 07/31/22   Rolly Salter, MD  ferrous sulfate 325 (65 FE) MG tablet Take 1 tablet (325 mg total) by mouth daily with breakfast. 07/25/22   Rolly Salter, MD  meclizine (ANTIVERT) 12.5 MG tablet Take 12.5 mg by mouth 3 (three) times daily as needed (vertigo).    [provider]  Multiple Vitamin (MULTIVITAMIN PO) Take 1 tablet by mouth daily. Centrum    [provider]  OVER THE COUNTER MEDICATION Take 1 tablet by mouth daily. Prostate health vitamin    [provider]  pantoprazole (PROTONIX) 40 MG tablet Take 1 tablet (40 mg total) by mouth 2 (two) times daily before a meal for 42 days, THEN 1 tablet (40 mg total) daily. 07/25/22 09/05/23  Rolly Salter, MD      Allergies    Tramadol    Review of Systems   Review of Systems  Respiratory:  Negative for cough.   Gastrointestinal:  Positive for abdominal pain and vomiting.  All other systems reviewed and are negative.   Physical Exam Updated Vital Signs BP 127/77   Pulse 82   Temp 98.3 F (36.8 C) (Oral)   Resp 18   Ht 5\' 2"  (1.575  m)   Wt 56 kg   SpO2 100%   BMI 22.58 kg/m  Physical Exam Vitals and nursing note reviewed.  Constitutional:      Appearance: He is well-developed.  HENT:     Head: Normocephalic.  Eyes:     Extraocular Movements: Extraocular movements intact.  Cardiovascular:     Rate and Rhythm: Normal rate.     Heart sounds: Normal heart sounds.  Pulmonary:     Effort: Pulmonary effort is normal.  Abdominal:     General: Abdomen is flat. Bowel sounds are normal.     Palpations: Abdomen is soft.     Tenderness: There is abdominal tenderness.     Hernia: No hernia is present.  Musculoskeletal:        General: Normal range of motion.     Cervical back: Normal range of motion.  Skin:    General: Skin is warm.  Neurological:      General: No focal deficit present.     Mental Status: He is alert and oriented to person, place, and time.  Psychiatric:        Mood and Affect: Mood normal.     ED Results / Procedures / Treatments   Labs (all labs ordered are listed, but only abnormal results are displayed) Labs Reviewed  CBC WITH DIFFERENTIAL/PLATELET - Abnormal; Notable for the following components:      Result Value   WBC 12.1 (*)    RBC 4.05 (*)    Hemoglobin 12.9 (*)    HCT 38.1 (*)    Neutro Abs 9.2 (*)    All other components within normal limits  COMPREHENSIVE METABOLIC PANEL - Abnormal; Notable for the following components:   Glucose, Bld 105 (*)    BUN 44 (*)    Creatinine, Ser 1.71 (*)    Total Protein 6.4 (*)    GFR, Estimated 38 (*)    All other components within normal limits  POC OCCULT BLOOD, ED - Abnormal; Notable for the following components:   Fecal Occult Bld POSITIVE (*)    All other components within normal limits  LIPASE, BLOOD  OCCULT BLOOD X 1 CARD TO LAB, STOOL  TYPE AND SCREEN    EKG EKG Interpretation Date/Time:  Monday February 15 2023 10:37:40 EDT Ventricular Rate:  99 PR Interval:  151 QRS Duration:  83 QT Interval:  351 QTC Calculation: 451 R Axis:   50  Text Interpretation: Sinus rhythm Probable left atrial enlargement Confirmed by Vanetta Mulders [phone]) on 02/15/2023 10:46:50 AM  Radiology No results found.  Procedures .Critical Care  Performed by: Elson Areas, PA-C Authorized by: Elson Areas, PA-C   Critical care provider statement:    Critical care time (minutes):  30   Critical care start time:  02/15/2023 11:30 AM   Critical care end time:  02/15/2023 2:15 PM   Critical care was necessary to treat or prevent imminent or life-threatening deterioration of the following conditions:  Dehydration   Critical care was time spent personally by me on the following activities:  Development of treatment plan with patient or surrogate, discussions  with consultants, evaluation of patient's response to treatment, examination of patient, ordering and review of laboratory studies, ordering and review of radiographic studies, ordering and performing treatments and interventions, pulse oximetry, re-evaluation of patient's condition and review of old charts   Care discussed with: admitting provider       Medications Ordered in ED Medications  pantoprozole (PROTONIX) 80 mg /  NS 100 mL infusion (8 mg/hr Intravenous New Bag/Given 02/15/23 1232)  pantoprazole (PROTONIX) injection 40 mg (has no administration in time range)  pantoprazole (PROTONIX) 80 mg /NS 100 mL IVPB (0 mg Intravenous Stopped 02/15/23 1327)  lactated ringers bolus 1,000 mL (1,000 mLs Intravenous Bolus 02/15/23 1204)    ED Course/ Medical Decision Making/ A&P                                 Medical Decision Making Patient complains of vomiting blood today.  Patient complains of weakness  Amount and/or Complexity of Data Reviewed External Data Reviewed: notes.    Details: notes from Dr. Pati Gallo GI who evaluated patient in February. Hospitalist notes reviewed. Labs: ordered. Decision-making details documented in ED Course.    Details: Labs ordered reviewed and interpreted.  Hemocult is positive.  Hemoglobin is 12.9  Radiology: ordered and independent interpretation performed. Decision-making details documented in ED Course. Discussion of management or test interpretation with external provider(s): I discussed with Dr. Marca Ancona.  She advised admit to medicine.  She will see.  I will obtain a ct bleeding study.  Unassigned medicine consulted for admission.  Risk Prescription drug management. Risk Details: Patient's blood pressure is slightly low at 100/50 range.  Patient is given 1 L of lactated Ringer's IV.  Blood pressure improved to the 120/70 range.  Patient is given IV Protonix and started on a Protonix drip.           Final Clinical Impression(s) / ED Diagnoses Final  diagnoses:  Upper GI bleed  Generalized abdominal pain    Rx / DC Orders ED Discharge Orders     None         Elson Areas, [place] 02/15/23 1416    Vanetta Mulders, MD 02/18/23 1232

## 2023-02-16 ENCOUNTER — Observation Stay (HOSPITAL_COMMUNITY): Payer: Medicare HMO | Admitting: Registered Nurse

## 2023-02-16 ENCOUNTER — Encounter (HOSPITAL_COMMUNITY): Payer: Self-pay | Admitting: Internal Medicine

## 2023-02-16 ENCOUNTER — Other Ambulatory Visit (HOSPITAL_COMMUNITY): Payer: Self-pay

## 2023-02-16 ENCOUNTER — Encounter (HOSPITAL_COMMUNITY)
Admission: EM | Disposition: A | Discharge status: Home / Self Care | Payer: Self-pay | Source: Home / Self Care | Attending: Emergency Medicine

## 2023-02-16 DIAGNOSIS — K2210 Ulcer of esophagus without bleeding: Secondary | ICD-10-CM

## 2023-02-16 DIAGNOSIS — K920 Hematemesis: Secondary | ICD-10-CM | POA: Diagnosis not present

## 2023-02-16 HISTORY — PX: ESOPHAGOGASTRODUODENOSCOPY (EGD) WITH PROPOFOL: SHX5813

## 2023-02-16 HISTORY — PX: BIOPSY: SHX5522

## 2023-02-16 LAB — CBC
HCT: 28.3 % — ABNORMAL LOW (ref 39.0–52.0)
Hemoglobin: 9.2 g/dL — ABNORMAL LOW (ref 13.0–17.0)
MCH: 30.7 pg (ref 26.0–34.0)
MCHC: 32.5 g/dL (ref 30.0–36.0)
MCV: 94.3 fL (ref 80.0–100.0)
Platelets: 128 10*3/uL — ABNORMAL LOW (ref 150–400)
RBC: 3 MIL/uL — ABNORMAL LOW (ref 4.22–5.81)
RDW: 13.8 % (ref 11.5–15.5)
WBC: 6.6 10*3/uL (ref 4.0–10.5)
nRBC: 0 % (ref 0.0–0.2)

## 2023-02-16 LAB — BASIC METABOLIC PANEL
Anion gap: 8 (ref 5–15)
BUN: 32 mg/dL — ABNORMAL HIGH (ref 8–23)
CO2: 27 mmol/L (ref 22–32)
Calcium: 8.9 mg/dL (ref 8.9–10.3)
Chloride: 106 mmol/L (ref 98–111)
Creatinine, Ser: 1.69 mg/dL — ABNORMAL HIGH (ref 0.61–1.24)
GFR, Estimated: 39 mL/min — ABNORMAL LOW (ref >60)
Glucose, Bld: 97 mg/dL (ref 70–99)
Potassium: 3.7 mmol/L (ref 3.5–5.1)
Sodium: 141 mmol/L (ref 135–145)

## 2023-02-16 LAB — APTT: aPTT: 32 s (ref 24–36)

## 2023-02-16 LAB — PROTIME-INR
INR: 1.1 (ref 0.8–1.2)
Prothrombin Time: 14.5 s (ref 11.4–15.2)

## 2023-02-16 SURGERY — ESOPHAGOGASTRODUODENOSCOPY (EGD) WITH PROPOFOL
Anesthesia: Monitor Anesthesia Care

## 2023-02-16 MED ORDER — PANTOPRAZOLE SODIUM 40 MG PO TBEC
40.0000 mg | DELAYED_RELEASE_TABLET | Freq: Every day | ORAL | 0 refills | Status: DC
  Filled 2023-02-16 (×2): qty 60, 30d supply, fill #0

## 2023-02-16 MED ORDER — SODIUM CHLORIDE 0.9 % IV SOLN: INTRAVENOUS | Status: DC

## 2023-02-16 MED ORDER — PROPOFOL 10 MG/ML IV BOLUS
INTRAVENOUS | Status: DC | PRN
  Administered 2023-02-16: 50 mg via INTRAVENOUS

## 2023-02-16 MED ORDER — SUCRALFATE 1 GM/10ML PO SUSP: 1.0000 g | Freq: Three times a day (TID) | ORAL | Status: DC

## 2023-02-16 MED ORDER — PROPOFOL 500 MG/50ML IV EMUL
INTRAVENOUS | Status: DC | PRN
Start: 2023-02-16 — End: 2023-02-16
  Administered 2023-02-16: 50 ug/kg/min via INTRAVENOUS

## 2023-02-16 MED ORDER — LIDOCAINE 2% (20 MG/ML) 5 ML SYRINGE
INTRAMUSCULAR | Status: DC | PRN
  Administered 2023-02-16: 60 mg via INTRAVENOUS

## 2023-02-16 MED ORDER — PANTOPRAZOLE SODIUM 40 MG IV SOLR
40.0000 mg | Freq: Two times a day (BID) | INTRAVENOUS | Status: DC
  Administered 2023-02-16: 40 mg via INTRAVENOUS
  Filled 2023-02-16: qty 10

## 2023-02-16 MED ORDER — LACTATED RINGERS IV SOLN: INTRAVENOUS | Status: DC

## 2023-02-16 MED ORDER — SUCRALFATE 1 GM/10ML PO SUSP
1.0000 g | Freq: Three times a day (TID) | ORAL | 0 refills | Status: DC
  Filled 2023-02-16: qty 420, 11d supply, fill #0

## 2023-02-16 SURGICAL SUPPLY — 15 items

## 2023-02-16 NOTE — TOC Transition Note (Signed)
Transition of Care [facility]) - CM/SW Discharge Note   Patient Details  Name: William Patel MRN: [id number] Date of Birth: 1934-03-18  [facility] CM/SW Contact:  Tom-Johnson, Hershal Coria, RN Phone Number: 02/16/2023, 2:51 PM   Clinical Narrative:     Patient is scheduled for discharge today.  Hospital f/u and discharge instructions on AVS. Prescriptions sent to [facility] pharmacy and meds will be delivered to patient at bedside prior discharge. No TOC needs or recommendations noted. Family to transport at discharge.  No further TOC needs noted.        Final next level of care: Home/Self Care Barriers to Discharge: Barriers Resolved   Patient Goals and CMS Choice CMS Medicare.gov Compare Post Acute Care list provided to:: Patient Choice offered to / list presented to : NA  Discharge Placement                  Patient to be transferred to facility by: Family      Discharge Plan and Services Additional resources added to the After Visit Summary for                  DME Arranged: N/A DME Agency: NA       HH Arranged: NA HH Agency: NA        Social Determinants of Health (SDOH) Interventions SDOH Screenings   Food Insecurity: No Food Insecurity (02/16/2023)  Housing: Patient Declined (02/16/2023)  Transportation Needs: No Transportation Needs (02/16/2023)  Utilities: Not At Risk (02/16/2023)  Tobacco Use: High Risk (02/16/2023)     Readmission Risk Interventions     No data to display

## 2023-02-16 NOTE — Progress Notes (Signed)
OT Cancellation Note  Patient Details Name: BANDY YACONO MRN: [id number] DOB: 1934-04-08   Cancelled Treatment:    Reason Eval/Treat Not Completed: OT screened, no needs identified, will sign off  Athalia Setterlund,HILLARY 02/16/2023, 2:49 PM Luisa Dago, OT/L   Acute OT Clinical Specialist Acute Rehabilitation Services Pager [phone] Office [phone]

## 2023-02-16 NOTE — Progress Notes (Signed)
Pt discharged home with son and daughter-in-law.  All discharge instructions reviewed.  Prescription picked up at [facility] before leaving.  NAD.

## 2023-02-16 NOTE — Consult Note (Signed)
[facility] Gastroenterology Consult  Referring Provider: ER Primary Care Physician:  Kaleen Mask, MD Primary Gastroenterologist: Dr. Levora Angel  Reason for Consultation: Coffee-ground emesis  HPI: William Patel is a 89 y.o. male was admitted with 2 episodes of coffee-ground emesis. Patient has previous stroke and is hard to understand however he is alert, awake and oriented x 3. He states he was in his usual state of health until yesterday when he vomited dark, almost black fluid x 2 and came to the ER. This was associated with severe abdominal pain which is now decreased her abdominal soreness. He had an EGD in 2/24 and was found to have a nonbleeding antral ulcer and possible Barrett's esophagus, biopsies were not performed as he was on Plavix. Patient thinks his last dose of Plavix was 2 nights ago. He is supposed to be on pantoprazole 40 mg indefinitely as per office note from 5/24, however does not recall being on a PPI and states he takes Alka-Seltzer or Tums for heartburn and acid reflux. He has not noted any change in color of stool and denies melena or hematochezia. He denies difficulty swallowing, pain on swallowing, unintentional weight loss or loss of appetite. He denies use of aspirin or NSAIDs but takes Plavix.  Previous GI history is significant for adenocarcinoma of colon in 2013 for which she underwent segmental colonic resection with negative margins and no lymphovascular invasion.  Past Medical History:  Diagnosis Date   Colon cancer (HCC)    Left    Generalized headaches    Stroke [facility])    Visual disturbances     Past Surgical History:  Procedure Laterality Date   COLON RESECTION  09/21/2011   Procedure: COLON RESECTION LAPAROSCOPIC;  Surgeon: Valarie Merino, MD;  Location: [facility] ORS;  Service: General;  Laterality: N/A;      COLONOSCOPY  feb 2013   endo done also   ESOPHAGOGASTRODUODENOSCOPY (EGD) WITH PROPOFOL N/A 07/25/2022   Procedure:  ESOPHAGOGASTRODUODENOSCOPY (EGD) WITH PROPOFOL;  Surgeon: Kerin Salen, MD;  Location: [facility] ENDOSCOPY;  Service: Gastroenterology;  Laterality: N/A;    Prior to Admission medications   Medication Sig Start Date End Date Taking? Authorizing Provider  acetaminophen (TYLENOL) 325 MG tablet Take 650 mg by mouth every 4 (four) hours as needed for mild pain, fever or headache.   Yes [provider]  atorvastatin (LIPITOR) 40 MG tablet Take 1 tablet (40 mg total) by mouth daily. 12/21/17  Yes McCue, Shanda Bumps, NP  B Complex-C (B-COMPLEX WITH VITAMIN C) tablet Take 1 tablet by mouth daily. 07/25/22  Yes Rolly Salter, MD  clopidogrel (PLAVIX) 75 MG tablet Take 1 tablet (75 mg total) by mouth daily. Patient taking differently: Take 75 mg by mouth at bedtime. 07/31/22  Yes Rolly Salter, MD  meclizine (ANTIVERT) 12.5 MG tablet Take 12.5 mg by mouth 3 (three) times daily as needed (vertigo).   Yes [provider]  Multiple Vitamin (MULTIVITAMIN PO) Take 1 tablet by mouth daily. Centrum   Yes [provider]  OVER THE COUNTER MEDICATION Take 1 tablet by mouth daily. Prostate health vitamin   Yes [provider]  pantoprazole (PROTONIX) 40 MG tablet Take 1 tablet (40 mg total) by mouth 2 (two) times daily before a meal for 42 days, THEN 1 tablet (40 mg total) daily. 07/25/22 09/05/23 Yes Rolly Salter, MD  ferrous sulfate 325 (65 FE) MG tablet Take 1 tablet (325 mg total) by mouth daily with breakfast. Patient not taking: Reported  on 02/15/2023 07/25/22   Rolly Salter, MD    Current Facility-Administered Medications  Medication Dose Route Frequency Provider Last Rate Last Admin   0.9 %  sodium chloride infusion   Intravenous Continuous Kerin Salen, MD       acetaminophen (TYLENOL) tablet 650 mg  650 mg Oral Q6H PRN Marrianne Mood, MD       Or   acetaminophen (TYLENOL) suppository 650 mg  650 mg Rectal Q6H PRN Marrianne Mood, MD       atorvastatin (LIPITOR) tablet 40 mg   40 mg Oral Daily Marrianne Mood, MD   40 mg at 02/15/23 1714   multivitamin with minerals tablet 1 tablet  1 tablet Oral Daily Marrianne Mood, MD   1 tablet at 02/15/23 1714   [START ON 02/19/2023] pantoprazole (PROTONIX) injection 40 mg  40 mg Intravenous Q12H Sofia, Leslie K, PA-C       pantoprozole (PROTONIX) 80 mg /NS 100 mL infusion  8 mg/hr Intravenous Continuous Elson Areas, [place] 10 mL/hr at 02/15/23 2229 8 mg/hr at 02/15/23 2229    Allergies as of 02/15/2023 - Review Complete 02/15/2023  Allergen Reaction Noted   Tramadol Other (See Comments) 09/03/2011    Family History  Problem Relation Age of Onset   Heart failure Mother    Brain cancer Father    Stroke Brother     Social History   Socioeconomic History   Marital status: Widowed    Spouse name: Not on file   Number of children: Not on file   Years of education: Not on file   Highest education level: Not on file  Occupational History   Not on file  Tobacco Use   Smoking status: Former    Types: Cigars   Smokeless tobacco: Current    Types: Chew   Tobacco comments:    1 to 2 per month  Vaping Use   Vaping status: Never Used  Substance and Sexual Activity   Alcohol use: Yes    Alcohol/week: 3.0 standard drinks of alcohol    Types: 3 Cans of beer per week   Drug use: No   Sexual activity: Not on file  Other Topics Concern   Not on file  Social History Narrative   Not on file   Social Determinants of Health   Financial Resource Strain: Not on file  Food Insecurity: No Food Insecurity (02/16/2023)   Hunger Vital Sign    Worried About Running Out of Food in the Last Year: Never true    Ran Out of Food in the Last Year: Never true  Transportation Needs: No Transportation Needs (02/16/2023)   PRAPARE - Administrator, Civil Service (Medical): No    Lack of Transportation (Non-Medical): No  Physical Activity: Not on file  Stress: Not on file  Social Connections: Not on file  Intimate  Partner Violence: Not At Risk (02/16/2023)   Humiliation, Afraid, Rape, and Kick questionnaire    Fear of Current or Ex-Partner: No    Emotionally Abused: No    Physically Abused: No    Sexually Abused: No    Review of Systems: As per HPI Physical Exam: Vital signs in last 24 hours: Temp:  [98.2 F (36.8 C)-99.3 F (37.4 C)] 98.2 F (36.8 C) (09/17 0445) Pulse Rate:  [65-99] 73 (09/17 0445) Resp:  [16-24] 18 (09/17 0445) BP: (90-139)/(52-94) 110/71 (09/17 0445) SpO2:  [81 %-100 %] 100 % (09/17 0445) Weight:  [56 kg] 56 kg (  09/16 1041) Last BM Date :  (PTA)  General:   Alert,  Well-developed, well-nourished, pleasant and cooperative in NAD, speech changes from stroke Head:  Normocephalic and atraumatic. Eyes:  Sclera clear, no icterus.  Mild pallor Ears:  Normal auditory acuity. Nose:  No deformity, discharge,  or lesions. Mouth:  No deformity or lesions.  Oropharynx pink & moist. Neck:  Supple; no masses or thyromegaly. Lungs:  Clear throughout to auscultation.   No wheezes, crackles, or rhonchi. No acute distress. Heart:  Regular rate and rhythm; no murmurs, clicks, rubs,  or gallops. Extremities: Right upper extremity weakness from stroke Neurologic:  Alert and  oriented x4 Skin:  Intact without significant lesions or rashes. Psych:  Alert and cooperative. Normal mood and affect. Abdomen:  Soft, nontender and nondistended. No masses, hepatosplenomegaly or hernias noted. Normal bowel sounds, without guarding, and without rebound.         Lab Results: Recent Labs    02/15/23 1145 02/15/23 1811 02/16/23 0600  WBC 12.1*  --  6.6  HGB 12.9* 9.2* 9.2*  HCT 38.1* 28.4* 28.3*  PLT 185  --  128*   BMET Recent Labs    02/15/23 1145 02/16/23 0600  NA 141 141  K 4.2 3.7  CL 103 106  CO2 25 27  GLUCOSE 105* 97  BUN 44* 32*  CREATININE 1.71* 1.69*  CALCIUM 10.1 8.9   LFT Recent Labs    02/15/23 1145  PROT 6.4*  ALBUMIN 3.7  AST 28  ALT 18  ALKPHOS 60   BILITOT 0.8   PT/INR Recent Labs    02/16/23 0600  LABPROT 14.5  INR 1.1    Studies/Results: CT ANGIO GI BLEED  Result Date: 02/15/2023 CLINICAL DATA:  Left upper quadrant abdominal pain, hematemesis EXAM: CTA ABDOMEN AND PELVIS WITHOUT AND WITH CONTRAST TECHNIQUE: Multidetector CT imaging of the abdomen and pelvis was performed using the standard protocol during bolus administration of intravenous contrast. Multiplanar reconstructed images and MIPs were obtained and reviewed to evaluate the vascular anatomy. RADIATION DOSE REDUCTION: This exam was performed according to the departmental dose-optimization program which includes automated exposure control, adjustment of the mA and/or kV according to patient size and/or use of iterative reconstruction technique. CONTRAST:  75mL OMNIPAQUE IOHEXOL 350 MG/ML SOLN COMPARISON:  03/27/2016 FINDINGS: VASCULAR Aorta: Normal caliber aorta without aneurysm, dissection, vasculitis or significant stenosis. Mild atherosclerosis. Celiac: Patent without evidence of aneurysm, dissection, vasculitis or significant stenosis. Mild atherosclerosis. SMA: There is focal high-grade stenosis at the origin of the SMA, estimated 50-70%. No evidence of aneurysm, dissection, or vasculitis. Renals: Both renal arteries are patent without evidence of aneurysm, dissection, vasculitis, fibromuscular dysplasia or significant stenosis. IMA: Patent without evidence of aneurysm, dissection, vasculitis or significant stenosis. Inflow: Patent without evidence of aneurysm, dissection, vasculitis or significant stenosis. Proximal Outflow: Bilateral common femoral and visualized portions of the superficial and profunda femoral arteries are patent without evidence of aneurysm, dissection, vasculitis or significant stenosis. Veins: No obvious venous abnormality within the limitations of this arterial phase study. Review of the MIP images confirms the above findings. NON-VASCULAR Lower chest:  Bibasilar scarring. No acute pleural or parenchymal lung disease. Hepatobiliary: Multiple liver hypodensities compatible with cysts, largest in the left lobe measuring 3.2 cm. No biliary duct dilation. The gallbladder is unremarkable. Pancreas: Unremarkable. No pancreatic ductal dilatation or surrounding inflammatory changes. Spleen: Normal in size without focal abnormality. 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 Cystoscopy is recommended. The kidneys enhance normally and symmetrically. No urinary tract calculi or obstructive uropathy. The adrenals are unremarkable. Stomach/Bowel: Postsurgical changes from prior partial colectomy and reanastomosis at the hepatic flexure. No bowel obstruction or ileus. Normal appendix right lower quadrant. Diverticulosis of the distal colon without evidence of acute diverticulitis. No bowel wall thickening or inflammatory change. No evidence of intraluminal contrast accumulation to suggest active gastrointestinal hemorrhage. Lymphatic: No pathologic adenopathy within the abdomen or pelvis. Reproductive: Prostate is unremarkable. Other: No free fluid or free intraperitoneal gas. Fat containing umbilical and left inguinal hernias. No bowel herniation. Musculoskeletal: No acute or destructive bony abnormalities. Chronic T11 anterior wedge compression deformity with 50% loss of height. Reconstructed images demonstrate no additional findings. IMPRESSION: VASCULAR 1. No evidence of active gastrointestinal bleeding. 2. High-grade stenosis at the origin of the SMA, estimated 50-70% stenosis. 3.  Aortic Atherosclerosis (ICD10-I70.0). NON-VASCULAR 1. 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 Cystoscopy recommended for further evaluation. 2. Distal colonic diverticulosis without  diverticulitis. 3. Fat containing umbilical and left inguinal hernias. Electronically Signed   By: Sharlet Salina M.D.   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 He has been appropriately started on IV Protonix drip after receiving 80 mg IV bolus on admission.    LOS: 0 days   Kerin Salen, MD  02/16/2023, 7:50 AM

## 2023-02-16 NOTE — Anesthesia Postprocedure Evaluation (Signed)
Anesthesia Post Note  Patient: William Patel  Procedure(s) Performed: ESOPHAGOGASTRODUODENOSCOPY (EGD) WITH PROPOFOL BIOPSY     Patient location during evaluation: PACU Anesthesia Type: MAC Level of consciousness: awake and alert Pain management: pain level controlled Vital Signs Assessment: post-procedure vital signs reviewed and stable Respiratory status: spontaneous breathing, nonlabored ventilation, respiratory function stable and patient connected to nasal cannula oxygen Cardiovascular status: stable and blood pressure returned to baseline Postop Assessment: no apparent nausea or vomiting Anesthetic complications: no  No notable events documented.  Last Vitals:  Vitals:   02/16/23 1140 02/16/23 1150  BP: 121/76 (!) 147/68  Pulse: 76 68  Resp: 20 18  Temp:    SpO2: 97% 95%    Last Pain:  Vitals:   02/16/23 1150  TempSrc:   PainSc: 0-No pain                 Edin Skarda S

## 2023-02-16 NOTE — Progress Notes (Signed)
PT Cancellation Note  Patient Details Name: William Patel MRN: [id number] DOB: Aug 06, 1933   Cancelled Treatment:    Reason Eval/Treat Not Completed: Other (comment). Per discussion with internal medicine team PT consult no longer necessary at this time. PT signing off.   Arlyss Gandy 02/16/2023, 1:43 PM

## 2023-02-16 NOTE — H&P (View-Only) (Signed)
[facility] Gastroenterology Consult  Referring Provider: ER Primary Care Physician:  Kaleen Mask, MD Primary Gastroenterologist: Dr. Levora Angel  Reason for Consultation: Coffee-ground emesis  HPI: William Patel is a 89 y.o. male was admitted with 2 episodes of coffee-ground emesis. Patient has previous stroke and is hard to understand however he is alert, awake and oriented x 3. He states he was in his usual state of health until yesterday when he vomited dark, almost black fluid x 2 and came to the ER. This was associated with severe abdominal pain which is now decreased her abdominal soreness. He had an EGD in 2/24 and was found to have a nonbleeding antral ulcer and possible Barrett's esophagus, biopsies were not performed as he was on Plavix. Patient thinks his last dose of Plavix was 2 nights ago. He is supposed to be on pantoprazole 40 mg indefinitely as per office note from 5/24, however does not recall being on a PPI and states he takes Alka-Seltzer or Tums for heartburn and acid reflux. He has not noted any change in color of stool and denies melena or hematochezia. He denies difficulty swallowing, pain on swallowing, unintentional weight loss or loss of appetite. He denies use of aspirin or NSAIDs but takes Plavix.  Previous GI history is significant for adenocarcinoma of colon in 2013 for which she underwent segmental colonic resection with negative margins and no lymphovascular invasion.  Past Medical History:  Diagnosis Date   Colon cancer (HCC)    Left    Generalized headaches    Stroke [facility])    Visual disturbances     Past Surgical History:  Procedure Laterality Date   COLON RESECTION  09/21/2011   Procedure: COLON RESECTION LAPAROSCOPIC;  Surgeon: Valarie Merino, MD;  Location: [facility] ORS;  Service: General;  Laterality: N/A;      COLONOSCOPY  feb 2013   endo done also   ESOPHAGOGASTRODUODENOSCOPY (EGD) WITH PROPOFOL N/A 07/25/2022   Procedure:  ESOPHAGOGASTRODUODENOSCOPY (EGD) WITH PROPOFOL;  Surgeon: Kerin Salen, MD;  Location: [facility] ENDOSCOPY;  Service: Gastroenterology;  Laterality: N/A;    Prior to Admission medications   Medication Sig Start Date End Date Taking? Authorizing Provider  acetaminophen (TYLENOL) 325 MG tablet Take 650 mg by mouth every 4 (four) hours as needed for mild pain, fever or headache.   Yes [provider]  atorvastatin (LIPITOR) 40 MG tablet Take 1 tablet (40 mg total) by mouth daily. 12/21/17  Yes McCue, Shanda Bumps, NP  B Complex-C (B-COMPLEX WITH VITAMIN C) tablet Take 1 tablet by mouth daily. 07/25/22  Yes Rolly Salter, MD  clopidogrel (PLAVIX) 75 MG tablet Take 1 tablet (75 mg total) by mouth daily. Patient taking differently: Take 75 mg by mouth at bedtime. 07/31/22  Yes Rolly Salter, MD  meclizine (ANTIVERT) 12.5 MG tablet Take 12.5 mg by mouth 3 (three) times daily as needed (vertigo).   Yes [provider]  Multiple Vitamin (MULTIVITAMIN PO) Take 1 tablet by mouth daily. Centrum   Yes [provider]  OVER THE COUNTER MEDICATION Take 1 tablet by mouth daily. Prostate health vitamin   Yes [provider]  pantoprazole (PROTONIX) 40 MG tablet Take 1 tablet (40 mg total) by mouth 2 (two) times daily before a meal for 42 days, THEN 1 tablet (40 mg total) daily. 07/25/22 09/05/23 Yes Rolly Salter, MD  ferrous sulfate 325 (65 FE) MG tablet Take 1 tablet (325 mg total) by mouth daily with breakfast. Patient not taking: Reported  on 02/15/2023 07/25/22   Rolly Salter, MD    Current Facility-Administered Medications  Medication Dose Route Frequency Provider Last Rate Last Admin   0.9 %  sodium chloride infusion   Intravenous Continuous Kerin Salen, MD       acetaminophen (TYLENOL) tablet 650 mg  650 mg Oral Q6H PRN Marrianne Mood, MD       Or   acetaminophen (TYLENOL) suppository 650 mg  650 mg Rectal Q6H PRN Marrianne Mood, MD       atorvastatin (LIPITOR) tablet 40 mg   40 mg Oral Daily Marrianne Mood, MD   40 mg at 02/15/23 1714   multivitamin with minerals tablet 1 tablet  1 tablet Oral Daily Marrianne Mood, MD   1 tablet at 02/15/23 1714   [START ON 02/19/2023] pantoprazole (PROTONIX) injection 40 mg  40 mg Intravenous Q12H Sofia, Leslie K, PA-C       pantoprozole (PROTONIX) 80 mg /NS 100 mL infusion  8 mg/hr Intravenous Continuous Elson Areas, [place] 10 mL/hr at 02/15/23 2229 8 mg/hr at 02/15/23 2229    Allergies as of 02/15/2023 - Review Complete 02/15/2023  Allergen Reaction Noted   Tramadol Other (See Comments) 09/03/2011    Family History  Problem Relation Age of Onset   Heart failure Mother    Brain cancer Father    Stroke Brother     Social History   Socioeconomic History   Marital status: Widowed    Spouse name: Not on file   Number of children: Not on file   Years of education: Not on file   Highest education level: Not on file  Occupational History   Not on file  Tobacco Use   Smoking status: Former    Types: Cigars   Smokeless tobacco: Current    Types: Chew   Tobacco comments:    1 to 2 per month  Vaping Use   Vaping status: Never Used  Substance and Sexual Activity   Alcohol use: Yes    Alcohol/week: 3.0 standard drinks of alcohol    Types: 3 Cans of beer per week   Drug use: No   Sexual activity: Not on file  Other Topics Concern   Not on file  Social History Narrative   Not on file   Social Determinants of Health   Financial Resource Strain: Not on file  Food Insecurity: No Food Insecurity (02/16/2023)   Hunger Vital Sign    Worried About Running Out of Food in the Last Year: Never true    Ran Out of Food in the Last Year: Never true  Transportation Needs: No Transportation Needs (02/16/2023)   PRAPARE - Administrator, Civil Service (Medical): No    Lack of Transportation (Non-Medical): No  Physical Activity: Not on file  Stress: Not on file  Social Connections: Not on file  Intimate  Partner Violence: Not At Risk (02/16/2023)   Humiliation, Afraid, Rape, and Kick questionnaire    Fear of Current or Ex-Partner: No    Emotionally Abused: No    Physically Abused: No    Sexually Abused: No    Review of Systems: As per HPI Physical Exam: Vital signs in last 24 hours: Temp:  [98.2 F (36.8 C)-99.3 F (37.4 C)] 98.2 F (36.8 C) (09/17 0445) Pulse Rate:  [65-99] 73 (09/17 0445) Resp:  [16-24] 18 (09/17 0445) BP: (90-139)/(52-94) 110/71 (09/17 0445) SpO2:  [81 %-100 %] 100 % (09/17 0445) Weight:  [56 kg] 56 kg (  09/16 1041) Last BM Date :  (PTA)  General:   Alert,  Well-developed, well-nourished, pleasant and cooperative in NAD, speech changes from stroke Head:  Normocephalic and atraumatic. Eyes:  Sclera clear, no icterus.  Mild pallor Ears:  Normal auditory acuity. Nose:  No deformity, discharge,  or lesions. Mouth:  No deformity or lesions.  Oropharynx pink & moist. Neck:  Supple; no masses or thyromegaly. Lungs:  Clear throughout to auscultation.   No wheezes, crackles, or rhonchi. No acute distress. Heart:  Regular rate and rhythm; no murmurs, clicks, rubs,  or gallops. Extremities: Right upper extremity weakness from stroke Neurologic:  Alert and  oriented x4 Skin:  Intact without significant lesions or rashes. Psych:  Alert and cooperative. Normal mood and affect. Abdomen:  Soft, nontender and nondistended. No masses, hepatosplenomegaly or hernias noted. Normal bowel sounds, without guarding, and without rebound.         Lab Results: Recent Labs    02/15/23 1145 02/15/23 1811 02/16/23 0600  WBC 12.1*  --  6.6  HGB 12.9* 9.2* 9.2*  HCT 38.1* 28.4* 28.3*  PLT 185  --  128*   BMET Recent Labs    02/15/23 1145 02/16/23 0600  NA 141 141  K 4.2 3.7  CL 103 106  CO2 25 27  GLUCOSE 105* 97  BUN 44* 32*  CREATININE 1.71* 1.69*  CALCIUM 10.1 8.9   LFT Recent Labs    02/15/23 1145  PROT 6.4*  ALBUMIN 3.7  AST 28  ALT 18  ALKPHOS 60   BILITOT 0.8   PT/INR Recent Labs    02/16/23 0600  LABPROT 14.5  INR 1.1    Studies/Results: CT ANGIO GI BLEED  Result Date: 02/15/2023 CLINICAL DATA:  Left upper quadrant abdominal pain, hematemesis EXAM: CTA ABDOMEN AND PELVIS WITHOUT AND WITH CONTRAST TECHNIQUE: Multidetector CT imaging of the abdomen and pelvis was performed using the standard protocol during bolus administration of intravenous contrast. Multiplanar reconstructed images and MIPs were obtained and reviewed to evaluate the vascular anatomy. RADIATION DOSE REDUCTION: This exam was performed according to the departmental dose-optimization program which includes automated exposure control, adjustment of the mA and/or kV according to patient size and/or use of iterative reconstruction technique. CONTRAST:  75mL OMNIPAQUE IOHEXOL 350 MG/ML SOLN COMPARISON:  03/27/2016 FINDINGS: VASCULAR Aorta: Normal caliber aorta without aneurysm, dissection, vasculitis or significant stenosis. Mild atherosclerosis. Celiac: Patent without evidence of aneurysm, dissection, vasculitis or significant stenosis. Mild atherosclerosis. SMA: There is focal high-grade stenosis at the origin of the SMA, estimated 50-70%. No evidence of aneurysm, dissection, or vasculitis. Renals: Both renal arteries are patent without evidence of aneurysm, dissection, vasculitis, fibromuscular dysplasia or significant stenosis. IMA: Patent without evidence of aneurysm, dissection, vasculitis or significant stenosis. Inflow: Patent without evidence of aneurysm, dissection, vasculitis or significant stenosis. Proximal Outflow: Bilateral common femoral and visualized portions of the superficial and profunda femoral arteries are patent without evidence of aneurysm, dissection, vasculitis or significant stenosis. Veins: No obvious venous abnormality within the limitations of this arterial phase study. Review of the MIP images confirms the above findings. NON-VASCULAR Lower chest:  Bibasilar scarring. No acute pleural or parenchymal lung disease. Hepatobiliary: Multiple liver hypodensities compatible with cysts, largest in the left lobe measuring 3.2 cm. No biliary duct dilation. The gallbladder is unremarkable. Pancreas: Unremarkable. No pancreatic ductal dilatation or surrounding inflammatory changes. Spleen: Normal in size without focal abnormality. 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 Cystoscopy is recommended. The kidneys enhance normally and symmetrically. No urinary tract calculi or obstructive uropathy. The adrenals are unremarkable. Stomach/Bowel: Postsurgical changes from prior partial colectomy and reanastomosis at the hepatic flexure. No bowel obstruction or ileus. Normal appendix right lower quadrant. Diverticulosis of the distal colon without evidence of acute diverticulitis. No bowel wall thickening or inflammatory change. No evidence of intraluminal contrast accumulation to suggest active gastrointestinal hemorrhage. Lymphatic: No pathologic adenopathy within the abdomen or pelvis. Reproductive: Prostate is unremarkable. Other: No free fluid or free intraperitoneal gas. Fat containing umbilical and left inguinal hernias. No bowel herniation. Musculoskeletal: No acute or destructive bony abnormalities. Chronic T11 anterior wedge compression deformity with 50% loss of height. Reconstructed images demonstrate no additional findings. IMPRESSION: VASCULAR 1. No evidence of active gastrointestinal bleeding. 2. High-grade stenosis at the origin of the SMA, estimated 50-70% stenosis. 3.  Aortic Atherosclerosis (ICD10-I70.0). NON-VASCULAR 1. 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 Cystoscopy recommended for further evaluation. 2. Distal colonic diverticulosis without  diverticulitis. 3. Fat containing umbilical and left inguinal hernias. Electronically Signed   By: Sharlet Salina M.D.   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 He has been appropriately started on IV Protonix drip after receiving 80 mg IV bolus on admission.    LOS: 0 days   Kerin Salen, MD  02/16/2023, 7:50 AM

## 2023-02-16 NOTE — Anesthesia Preprocedure Evaluation (Signed)
Anesthesia Evaluation  Patient identified by MRN, date of birth, ID band Patient awake    Reviewed: Allergy & Precautions, H&P , NPO status , Patient's Chart, lab work & pertinent test results  Airway Mallampati: I  TM Distance: >3 FB Neck ROM: Full    Dental no notable dental hx.    Pulmonary neg pulmonary ROS, former smoker   Pulmonary exam normal breath sounds clear to auscultation       Cardiovascular hypertension, Normal cardiovascular exam Rhythm:Regular Rate:Normal     Neuro/Psych CVA, Residual Symptoms  negative psych ROS   GI/Hepatic Neg liver ROS, PUD,,,  Endo/Other  negative endocrine ROS    Renal/GU Renal InsufficiencyRenal disease  negative genitourinary   Musculoskeletal negative musculoskeletal ROS (+)    Abdominal   Peds negative pediatric ROS (+)  Hematology  (+) Blood dyscrasia, anemia   Anesthesia Other Findings   Reproductive/Obstetrics negative OB ROS                             Anesthesia Physical Anesthesia Plan  ASA: 3  Anesthesia Plan: MAC   Post-op Pain Management: Minimal or no pain anticipated   Induction: Intravenous  PONV Risk Score and Plan: 1 and Propofol infusion and Treatment may vary due to age or medical condition  Airway Management Planned: Nasal Cannula  Additional Equipment:   Intra-op Plan:   Post-operative Plan:   Informed Consent: I have reviewed the patients History and Physical, chart, labs and discussed the procedure including the risks, benefits and alternatives for the proposed anesthesia with the patient or authorized representative who has indicated his/her understanding and acceptance.     Dental advisory given  Plan Discussed with: CRNA and Surgeon  Anesthesia Plan Comments:        Anesthesia Quick Evaluation

## 2023-02-16 NOTE — Care Management Obs Status (Signed)
MEDICARE OBSERVATION STATUS NOTIFICATION   Patient Details  Name: William Patel MRN: [id number] Date of Birth: December 31, 1933   Medicare Observation Status Notification Given:  Yes    Tom-Johnson, Hershal Coria, RN 02/16/2023, 2:57 PM

## 2023-02-16 NOTE — Op Note (Signed)
[facility] Patient Name: William Patel Procedure Date : 02/16/2023 MRN: [id number] Attending MD: Kerin Salen , MD, [id number] Date of Birth: 1933/12/09 CSN: [id number] Age: 89 Admit Type: Inpatient Procedure:                Upper GI endoscopy Indications:              Acute post hemorrhagic anemia, Coffee-ground emesis Providers:                Kerin Salen, MD, Jacquelyn "Jaci" Clelia Croft, RN, Glory Rosebush, RN, Marja Kays, Technician Referring MD:             Internal Medicine Residency Team Medicines:                Monitored Anesthesia Care Complications:            No immediate complications. Estimated blood loss:                            Minimal. Estimated Blood Loss:     Estimated blood loss was minimal. Procedure:                Pre-Anesthesia Assessment:                           - Prior to the procedure, a History and Physical                            was performed, and patient medications and                            allergies were reviewed. The patient's tolerance of                            previous anesthesia was also reviewed. The risks                            and benefits of the procedure and the sedation                            options and risks were discussed with the patient.                            All questions were answered, and informed consent                            was obtained. Prior Anticoagulants: The patient has                            taken Plavix (clopidogrel), last dose was 2 days                            prior to procedure. ASA Grade Assessment: III - A  patient with severe systemic disease. After                            reviewing the risks and benefits, the patient was                            deemed in satisfactory condition to undergo the                            procedure.                           After obtaining informed consent, the endoscope was                             passed under direct vision. Throughout the                            procedure, the patient's blood pressure, pulse, and                            oxygen saturations were monitored continuously. The                            [id number]-[id number] ([id number]) Olympus endoscope was introduced                            through the mouth, and advanced to the second part                            of duodenum. The upper GI endoscopy was                            accomplished without difficulty. The patient                            tolerated the procedure well. Scope In: Scope Out: Findings:      Many linear and superficial esophageal ulcers with no bleeding and no       stigmata of recent bleeding were found 30 to 35 cm from the incisors.      LA Grade C (one or more mucosal breaks continuous between tops of 2 or       more mucosal folds, less than 75% circumference) esophagitis with no       bleeding was found 30 to 35 cm from the incisors.      Four non-bleeding cratered gastric ulcers with a clean ulcer base       (Forrest Class III) were found in the gastric antrum. The largest lesion       was 6 mm in largest dimension. Biopsies were taken with a cold forceps       for Helicobacter pylori testing.      The cardia and gastric fundus were normal on retroflexion.      A medium non-bleeding diverticulum was found in the second portion of       the duodenum.      The  ampulla, duodenal bulb and first portion of the duodenum were normal. Impression:               - Esophageal ulcers with no bleeding and no                            stigmata of recent bleeding.                           - LA Grade C esophagitis with no bleeding.                           - Non-bleeding gastric ulcers with a clean ulcer                            base (Forrest Class III). Biopsied.                           - Non-bleeding duodenal diverticulum.                           - Normal ampulla,  duodenal bulb and first portion                            of the duodenum. Moderate Sedation:      Patient did not receive moderate sedation for this procedure, but       instead received monitored anesthesia care. Recommendation:           - Resume regular diet.                           - Continue present medications.                           - Await pathology results.                           - Pantoprazole 40 mg twice a day for 1 month and                            then 40 mg once a day indefinitely.                           - Sucralfate suspension 1gm/10 ml four times a day                            for 7 days.                           - Ok to resume plavix in am tomorrow.                           - Treat H pylori if found. Procedure Code(s):        --- Professional ---  21308, Esophagogastroduodenoscopy, flexible,                            transoral; with biopsy, single or multiple Diagnosis Code(s):        --- Professional ---                           K22.10, Ulcer of esophagus without bleeding                           K20.90, Esophagitis, unspecified without bleeding                           K25.9, Gastric ulcer, unspecified as acute or                            chronic, without hemorrhage or perforation                           D62, Acute posthemorrhagic anemia                           K92.0, Hematemesis                           K57.10, Diverticulosis of small intestine without                            perforation or abscess without bleeding CPT copyright 2022 [employer]. All rights reserved. The codes documented in this report are preliminary and upon coder review may  be revised to meet current compliance requirements. Kerin Salen, MD 02/16/2023 11:38:27 AM This report has been signed electronically. Number of Addenda: 0

## 2023-02-16 NOTE — Transfer of Care (Signed)
Immediate Anesthesia Transfer of Care Note  Patient: William Patel  Procedure(s) Performed: ESOPHAGOGASTRODUODENOSCOPY (EGD) WITH PROPOFOL BIOPSY  Patient Location: PACU  Anesthesia Type:General  Level of Consciousness: drowsy  Airway & Oxygen Therapy: Patient Spontanous Breathing  Post-op Assessment: Report given to RN and Post -op Vital signs reviewed and stable  Post vital signs: Reviewed and stable  Last Vitals:  Vitals Value Taken Time  BP 93/58 02/16/23 1132  Temp 36.5 C 02/16/23 1132  Pulse 76 02/16/23 1139  Resp 20 02/16/23 1139  SpO2 97 % 02/16/23 1139  Vitals shown include unfiled device data.  Last Pain:  Vitals:   02/16/23 1132  TempSrc: Temporal  PainSc: Asleep         Complications: No notable events documented.

## 2023-02-16 NOTE — Interval H&P Note (Signed)
History and Physical Interval Note: 89/male with coffee ground emesis, plavix use and anemia for EGD with propofol.  02/16/2023 10:04 AM  William Patel  has presented today for egd, with the diagnosis of Hematemesis, on plavix.  The various methods of treatment have been discussed with the patient and family. After consideration of risks, benefits and other options for treatment, the patient has consented to  Procedure(s): ESOPHAGOGASTRODUODENOSCOPY (EGD) WITH PROPOFOL (N/A) as a surgical intervention.  The patient's history has been reviewed, patient examined, no change in status, stable for surgery.  I have reviewed the patient's chart and labs.  Questions were answered to the patient's satisfaction.     Kerin Salen

## 2023-02-20 ENCOUNTER — Encounter (HOSPITAL_COMMUNITY): Payer: Self-pay | Admitting: Gastroenterology

## 2023-02-22 NOTE — Discharge Summary (Signed)
Name: William Patel MRN: [id number] DOB: 1934/04/22 89 y.o. PCP: Kaleen Mask, MD  Date of Admission: 02/15/2023 10:36 AM Date of Discharge: 02/16/23 Attending Physician: Dr. Antony Contras  Discharge Diagnosis: Principal Problem:   Hematemesis Active Problems:   Chronic kidney disease, stage 3a (HCC)   History of cerebrovascular accident (CVA) with residual deficit   CKD stage 3b, GFR 30-44 ml/min (HCC)   Peptic ulcer disease    Discharge Medications: Allergies as of 02/16/2023   No Known Allergies      Medication List     TAKE these medications    acetaminophen 325 MG tablet Commonly known as: TYLENOL Take 650 mg by mouth every 4 (four) hours as needed for mild pain, fever or headache.   atorvastatin 40 MG tablet Commonly known as: Lipitor Take 1 tablet (40 mg total) by mouth daily.   B-complex with vitamin C tablet Take 1 tablet by mouth daily.   clopidogrel 75 MG tablet Commonly known as: PLAVIX Take 1 tablet (75 mg total) by mouth daily. What changed: when to take this   ferrous sulfate 325 (65 FE) MG tablet Take 1 tablet (325 mg total) by mouth daily with breakfast.   meclizine 12.5 MG tablet Commonly known as: ANTIVERT Take 12.5 mg by mouth 3 (three) times daily as needed (vertigo).   MULTIVITAMIN PO Take 1 tablet by mouth daily. Centrum   OVER THE COUNTER MEDICATION Take 1 tablet by mouth daily. Prostate health vitamin   pantoprazole 40 MG tablet Commonly known as: Protonix Take 1 tablet (40 mg total) by mouth TWICE daily for 1 month until this prescription ends 10/17 ... Then take ONCE daily indefinitely What changed: See the new instructions.   sucralfate 1 GM/10ML suspension Commonly known as: CARAFATE Take 10 mLs (1 g total) by mouth 4 (four) times daily -  with meals and at bedtime.        Disposition and follow-up:   [name] was discharged from [facility] in Good condition.  At the hospital follow  up visit please address:  1.  Follow-up:  a. PUD, Acute upper GI bleed    b. Chronic iron-deficiency anemia   2.  Labs / imaging needed at time of follow-up: none  3.  Pending labs/ test needing follow-up: CBC  4.  Medication Changes  Started pantoprazole 40 bid for one month, then once a day  Started sucralfate 1g suspension QID for 1 week  Follow-up Appointments: schedule per pt. Dr. Marca Ancona scheduled her follow up but not documented on discharge. Same GI who saw pt this admission is outpatient GI provider.   Hospital Course by problem list:  #Acute upper GI bleed #Hypovolemia Hx in Feb 2024 of coffee ground hematemesis presumed secondary to gastric ulcers and plavix, responded to PPI twice daily for 6 wks and then once daily PPI and 5 day holiday from plavix. Hemodynamically stable with bolus x2. Went to EGD. Per Dr. Marca Ancona, "Egd showed esophageal ulcers, esophagitis and antral ulcers, all clean based. I have taken biopsies for H pylori. I will resume him on a regular diet. He needs PPI , pantoprazole 40 mg BID for 1 month and then once a day indefinitely. Ok to resume plavix from tmr,ok to d/.c home today." Started pantoprazole 40 BID for one month, then once daily Started sucralfate 1g suspension QID for 1 week  Discharge Subjective: Pt had no concerns on day of discharge. Denies abdominal pain, heartburn, hematemiss, n/v. Denies dizziness  when standing. He is eating and drinking okay.   Discharge Exam:   BP (!) 147/68   Pulse 68   Temp 97.7 F (36.5 C) (Temporal)   Resp 18   Ht 5\' 2"  (1.575 m)   Wt 56 kg   SpO2 95%   BMI 22.58 kg/m  Constitutional: well-appearing  sitting in, in no acute distress HENT: normocephalic atraumatic, mucous membranes moist Eyes: conjunctiva non-erythematous Neck: supple Cardiovascular: regular rate and rhythm, no m/r/g Pulmonary/Chest: normal work of breathing on room air, lungs clear to auscultation bilaterally Abdominal: soft, non-tender,  non-distended MSK: normal bulk and tone Neurological: alert & oriented x 3, 5/5 strength in bilateral upper and lower extremities, normal gait Skin: warm and dry   Pertinent Labs, Studies, and Procedures:     Latest Ref Rng & Units 02/16/2023    6:00 AM 02/15/2023    6:11 PM 02/15/2023   11:45 AM  CBC  WBC 4.0 - 10.5 K/uL 6.6   12.1   Hemoglobin 13.0 - 17.0 g/dL 9.2  9.2  16.1   Hematocrit 39.0 - 52.0 % 28.3  28.4  38.1   Platelets 150 - 400 K/uL 128   185        Latest Ref Rng & Units 02/16/2023    6:00 AM 02/15/2023   11:45 AM 07/25/2022    3:23 AM  CMP  Glucose 70 - 99 mg/dL 97  096  83   BUN 8 - 23 mg/dL 32  44  35   Creatinine 0.61 - 1.24 mg/dL 0.45  4.09  8.11   Sodium 135 - 145 mmol/L 141  141  140   Potassium 3.5 - 5.1 mmol/L 3.7  4.2  3.6   Chloride 98 - 111 mmol/L 106  103  115   CO2 22 - 32 mmol/L 27  25  20    Calcium 8.9 - 10.3 mg/dL 8.9  91.4  8.1   Total Protein 6.5 - 8.1 g/dL  6.4    Total Bilirubin 0.3 - 1.2 mg/dL  0.8    Alkaline Phos 38 - 126 U/L  60    AST 15 - 41 U/L  28    ALT 0 - 44 U/L  18      CT ANGIO GI BLEED  Result Date: 02/15/2023 CLINICAL DATA:  Left upper quadrant abdominal pain, hematemesis EXAM: CTA ABDOMEN AND PELVIS WITHOUT AND WITH CONTRAST TECHNIQUE: Multidetector CT imaging of the abdomen and pelvis was performed using the standard protocol during bolus administration of intravenous contrast. Multiplanar reconstructed images and MIPs were obtained and reviewed to evaluate the vascular anatomy. RADIATION DOSE REDUCTION: This exam was performed according to the departmental dose-optimization program which includes automated exposure control, adjustment of the mA and/or kV according to patient size and/or use of iterative reconstruction technique. CONTRAST:  75mL OMNIPAQUE IOHEXOL 350 MG/ML SOLN COMPARISON:  03/27/2016 FINDINGS: VASCULAR Aorta: Normal caliber aorta without aneurysm, dissection, vasculitis or significant stenosis. Mild  atherosclerosis. Celiac: Patent without evidence of aneurysm, dissection, vasculitis or significant stenosis. Mild atherosclerosis. SMA: There is focal high-grade stenosis at the origin of the SMA, estimated 50-70%. No evidence of aneurysm, dissection, or vasculitis. Renals: Both renal arteries are patent without evidence of aneurysm, dissection, vasculitis, fibromuscular dysplasia or significant stenosis. IMA: Patent without evidence of aneurysm, dissection, vasculitis or significant stenosis. Inflow: Patent without evidence of aneurysm, dissection, vasculitis or significant stenosis. Proximal Outflow: Bilateral common femoral and visualized portions of the superficial and profunda femoral arteries are patent  without evidence of aneurysm, dissection, vasculitis or significant stenosis. Veins: No obvious venous abnormality within the limitations of this arterial phase study. Review of the MIP images confirms the above findings. NON-VASCULAR Lower chest: Bibasilar scarring. No acute pleural or parenchymal lung disease. Hepatobiliary: Multiple liver hypodensities compatible with cysts, largest in the left lobe measuring 3.2 cm. No biliary duct dilation. The gallbladder is unremarkable. Pancreas: Unremarkable. No pancreatic ductal dilatation or surrounding inflammatory changes. Spleen: Normal in size without focal abnormality. 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 Cystoscopy is recommended. The kidneys enhance normally and symmetrically. No urinary tract calculi or obstructive uropathy. The adrenals are unremarkable. Stomach/Bowel: Postsurgical changes from prior partial colectomy and reanastomosis at the hepatic flexure. No bowel obstruction or ileus. Normal appendix right lower quadrant. Diverticulosis of the distal colon without evidence of acute diverticulitis. No bowel wall  thickening or inflammatory change. No evidence of intraluminal contrast accumulation to suggest active gastrointestinal hemorrhage. Lymphatic: No pathologic adenopathy within the abdomen or pelvis. Reproductive: Prostate is unremarkable. Other: No free fluid or free intraperitoneal gas. Fat containing umbilical and left inguinal hernias. No bowel herniation. Musculoskeletal: No acute or destructive bony abnormalities. Chronic T11 anterior wedge compression deformity with 50% loss of height. Reconstructed images demonstrate no additional findings. IMPRESSION: VASCULAR 1. No evidence of active gastrointestinal bleeding. 2. High-grade stenosis at the origin of the SMA, estimated 50-70% stenosis. 3.  Aortic Atherosclerosis (ICD10-I70.0). NON-VASCULAR 1. 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 Cystoscopy recommended for further evaluation. 2. Distal colonic diverticulosis without diverticulitis. 3. Fat containing umbilical and left inguinal hernias. Electronically Signed   By: Sharlet Salina M.D.   On: 02/15/2023 18:13     Discharge Instructions: Discharge Instructions     Diet - low sodium heart healthy   Complete by: As directed    Increase activity slowly   Complete by: As directed        Signed: Meryl Dare, MD PGY-1 Psychiatry Resident 02/22/2023, 6:32 AM

## 2024-04-25 ENCOUNTER — Other Ambulatory Visit: Payer: Self-pay | Admitting: Urology

## 2024-05-18 ENCOUNTER — Encounter (HOSPITAL_COMMUNITY): Payer: Self-pay

## 2024-05-18 NOTE — Patient Instructions (Signed)
 SURGICAL WAITING ROOM VISITATION Patients having surgery or a procedure may have no more than 2 support people in the waiting area - these visitors may rotate.    Children under the age of 50 will not be allowed to visit due to the increase in respiratory illness  Children under the age of 17 must have an adult with them who is not the patient.  If the patient needs to stay at the hospital during part of their recovery, the visitor guidelines for inpatient rooms apply. Pre-op nurse will coordinate an appropriate time for 1 support person to accompany patient in pre-op.  This support person may not rotate.    Please refer to the [facility] website for the visitor guidelines for Inpatients (after your surgery is over and you are in a regular room).       Your procedure is scheduled on: 05-30-24   Report to [facility] Main Entrance    Report to admitting at 1:00 PM   Call this number if you have problems the morning of surgery [phone]   Do not eat food or drink liquids:After Midnight.          If you have questions, please contact your surgeons office.   FOLLOW ANY ADDITIONAL PRE OP INSTRUCTIONS YOU RECEIVED FROM YOUR SURGEON'S OFFICE!!!     Oral Hygiene is also important to reduce your risk of infection.                                    Remember - BRUSH YOUR TEETH THE MORNING OF SURGERY WITH YOUR REGULAR TOOTHPASTE   Do NOT smoke after Midnight   Take these medicines the morning of surgery with A SIP OF WATER:    Atorvastatin    Pantoprazole    If needed Tylenol , Meclizine   Stop all vitamins and herbal supplements 7 days before surgery  Hold Plavix  5 days before surgery                               You may not have any metal on your body including  jewelry, and body piercing             Do not wear  lotions, powders, cologne, or deodorant              Men may shave face and neck.   Do not bring valuables to the hospital. [facility] IS NOT RESPONSIBLE    FOR VALUABLES.   Contacts, dentures or bridgework may not be worn into surgery.  DO NOT BRING YOUR HOME MEDICATIONS TO THE HOSPITAL. PHARMACY WILL DISPENSE MEDICATIONS LISTED ON YOUR MEDICATION LIST TO YOU DURING YOUR ADMISSION IN THE HOSPITAL!    Patients discharged on the day of surgery will not be allowed to drive home.  Someone NEEDS to stay with you for the first 24 hours after anesthesia.              Please read over the following fact sheets you were given: IF YOU HAVE QUESTIONS ABOUT YOUR PRE-OP INSTRUCTIONS PLEASE CALL [phone] Gwen  If you received a COVID test during your pre-op visit  it is requested that you wear a mask when out in public, stay away from anyone that may not be feeling well and notify your surgeon if you develop symptoms. If you test positive for Covid or have been  in contact with anyone that has tested positive in the last 10 days please notify you surgeon.  [facility] - Preparing for Surgery Before surgery, you can play an important role.  Because skin is not sterile, your skin needs to be as free of germs as possible.  You can reduce the number of germs on your skin by washing with CHG (chlorahexidine gluconate) soap before surgery.  CHG is an antiseptic cleaner which kills germs and bonds with the skin to continue killing germs even after washing. Please DO NOT use if you have an allergy to CHG or antibacterial soaps.  If your skin becomes reddened/irritated stop using the CHG and inform your nurse when you arrive at Short Stay. Do not shave (including legs and underarms) for at least 48 hours prior to the first CHG shower.  You may shave your face/neck.  Please follow these instructions carefully:  1.  Shower with CHG Soap the night before surgery and the  morning of surgery.  2.  If you choose to wash your hair, wash your hair first as usual with your normal  shampoo.  3.  After you shampoo, rinse your hair and body thoroughly to remove the shampoo.                              4.  Use CHG as you would any other liquid soap.  You can apply chg directly to the skin and wash.  Gently with a scrungie or clean washcloth.  5.  Apply the CHG Soap to your body ONLY FROM THE NECK DOWN.   Do   not use on face/ open                           Wound or open sores. Avoid contact with eyes, ears mouth and   genitals (private parts).                       Wash face,  Genitals (private parts) with your normal soap.             6.  Wash thoroughly, paying special attention to the area where your    surgery  will be performed.  7.  Thoroughly rinse your body with warm water from the neck down.  8.  DO NOT shower/wash with your normal soap after using and rinsing off the CHG Soap.                9.  Pat yourself dry with a clean towel.            10.  Wear clean pajamas.            11.  Place clean sheets on your bed the night of your first shower and do not  sleep with pets. Day of Surgery : Do not apply any lotions/deodorants the morning of surgery.  Please wear clean clothes to the hospital/surgery center.  FAILURE TO FOLLOW THESE INSTRUCTIONS MAY RESULT IN THE CANCELLATION OF YOUR SURGERY  PATIENT SIGNATURE_________________________________  NURSE SIGNATURE__________________________________  ________________________________________________________________________

## 2024-05-18 NOTE — Progress Notes (Signed)
 Date of COVID positive in last 90 days:  PCP - Tanda Eke, MD Cardiologist - Peter Jordan, MD (OV in 2013)  Chest x-ray - N/A EKG -  Stress Test - 06-15-12 Epic ECHO - 08-07-17 Epic Cardiac Cath - N/A Pacemaker/ICD device last checked:N/A Spinal Cord Stimulator:N/A  Bowel Prep - N/A  Sleep Study - N/A CPAP -   Fasting Blood Sugar - N/A Checks Blood Sugar _____ times a day  Last dose of GLP1 agonist-  N/A GLP1 instructions:  Do not take after     Last dose of SGLT-2 inhibitors-  N/A SGLT-2 instructions:  Do not take after     Blood Thinner Instructions: Plavix  N/A Last dose:   Time: Aspirin  Instructions:N/A Last Dose:  Activity level:  Can go up a flight of stairs and perform activities of daily living without stopping and without symptoms of chest pain or shortness of breath.  Able to exercise without symptoms  Unable to go up a flight of stairs without symptoms of     Anesthesia review: CHF, carotid stenosis, hx of stroke  Patient denies shortness of breath, fever, cough and chest pain at PAT appointment  Patient verbalized understanding of instructions that were given to them at the PAT appointment. Patient was also instructed that they will need to review over the PAT instructions again at home before surgery.

## 2024-05-19 ENCOUNTER — Other Ambulatory Visit: Payer: Self-pay

## 2024-05-19 ENCOUNTER — Encounter (HOSPITAL_COMMUNITY)
Admission: RE | Admit: 2024-05-19 | Discharge: 2024-05-19 | Disposition: A | Discharge status: Home / Self Care | Source: Ambulatory Visit | Attending: Urology | Admitting: Urology

## 2024-05-19 ENCOUNTER — Encounter (HOSPITAL_COMMUNITY): Payer: Self-pay

## 2024-05-19 VITALS — BP 135/75 | HR 83 | Temp 98.3°F | Resp 16 | Ht 62.75 in | Wt 140.8 lb

## 2024-05-19 DIAGNOSIS — I10 Essential (primary) hypertension: Secondary | ICD-10-CM

## 2024-05-19 DIAGNOSIS — Z01818 Encounter for other preprocedural examination: Secondary | ICD-10-CM | POA: Insufficient documentation

## 2024-05-19 DIAGNOSIS — D631 Anemia in chronic kidney disease: Secondary | ICD-10-CM | POA: Insufficient documentation

## 2024-05-19 DIAGNOSIS — I129 Hypertensive chronic kidney disease with stage 1 through stage 4 chronic kidney disease, or unspecified chronic kidney disease: Secondary | ICD-10-CM | POA: Diagnosis not present

## 2024-05-19 DIAGNOSIS — N183 Chronic kidney disease, stage 3 unspecified: Secondary | ICD-10-CM | POA: Diagnosis not present

## 2024-05-19 DIAGNOSIS — Z85038 Personal history of other malignant neoplasm of large intestine: Secondary | ICD-10-CM | POA: Diagnosis not present

## 2024-05-19 DIAGNOSIS — Z8673 Personal history of transient ischemic attack (TIA), and cerebral infarction without residual deficits: Secondary | ICD-10-CM | POA: Insufficient documentation

## 2024-05-19 DIAGNOSIS — Z87891 Personal history of nicotine dependence: Secondary | ICD-10-CM | POA: Diagnosis not present

## 2024-05-19 DIAGNOSIS — K219 Gastro-esophageal reflux disease without esophagitis: Secondary | ICD-10-CM | POA: Insufficient documentation

## 2024-05-19 DIAGNOSIS — D414 Neoplasm of uncertain behavior of bladder: Secondary | ICD-10-CM | POA: Diagnosis not present

## 2024-05-19 DIAGNOSIS — Z7902 Long term (current) use of antithrombotics/antiplatelets: Secondary | ICD-10-CM | POA: Insufficient documentation

## 2024-05-19 DIAGNOSIS — D649 Anemia, unspecified: Secondary | ICD-10-CM

## 2024-05-19 HISTORY — DX: Anemia, unspecified: D64.9

## 2024-05-19 HISTORY — DX: Gastro-esophageal reflux disease without esophagitis: K21.9

## 2024-05-19 LAB — CBC
HCT: 38.3 % — ABNORMAL LOW (ref 39.0–52.0)
Hemoglobin: 12.4 g/dL — ABNORMAL LOW (ref 13.0–17.0)
MCH: 30.5 pg (ref 26.0–34.0)
MCHC: 32.4 g/dL (ref 30.0–36.0)
MCV: 94.1 fL (ref 80.0–100.0)
Platelets: 132 K/uL — ABNORMAL LOW (ref 150–400)
RBC: 4.07 MIL/uL — ABNORMAL LOW (ref 4.22–5.81)
RDW: 13.5 % (ref 11.5–15.5)
WBC: 5.6 K/uL (ref 4.0–10.5)
nRBC: 0 % (ref 0.0–0.2)

## 2024-05-19 LAB — BASIC METABOLIC PANEL WITH GFR
Anion gap: 11 (ref 5–15)
BUN: 29 mg/dL — ABNORMAL HIGH (ref 8–23)
CO2: 23 mmol/L (ref 22–32)
Calcium: 9.4 mg/dL (ref 8.9–10.3)
Chloride: 109 mmol/L (ref 98–111)
Creatinine, Ser: 1.95 mg/dL — ABNORMAL HIGH (ref 0.61–1.24)
GFR, Estimated: 32 mL/min — ABNORMAL LOW
Glucose, Bld: 79 mg/dL (ref 70–99)
Potassium: 3.8 mmol/L (ref 3.5–5.1)
Sodium: 142 mmol/L (ref 135–145)

## 2024-05-22 ENCOUNTER — Encounter (HOSPITAL_COMMUNITY): Payer: Self-pay

## 2024-05-22 NOTE — Progress Notes (Signed)
 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  Hx of left MCA stroke in 2019. Has residual right sided weakness and expressive aphasia. On Plavix   At PAT visit pt reports he can do stairs w/o CP/SOB.  LD Plavix : 5 day hold  VS: BP 135/75   Pulse 83   Temp 36.8 C (Oral)   Resp 16   Ht 5' 2.75 (1.594 m)   Wt 63.9 kg   SpO2 97%   BMI 25.14 kg/m   PROVIDERS: Loring Tanda Mae, MD   LABS: Labs reviewed: Acceptable for surgery. (all labs ordered are listed, but only abnormal results are displayed)  Labs Reviewed  BASIC METABOLIC PANEL WITH GFR - Abnormal; Notable for the following components:      Result Value   BUN 29 (*)    Creatinine, Ser 1.95 (*)    GFR, Estimated 32 (*)    All other components within normal limits  CBC - Abnormal; Notable for the following components:   RBC 4.07 (*)    Hemoglobin 12.4 (*)    HCT 38.3 (*)    Platelets 132 (*)    All other components within normal limits     IMAGES:   EKG 05/19/24:  NSR   Echo 08/07/2017:  Study Conclusions  - Left ventricle: The cavity size was normal. Wall thickness was   normal. Systolic function was vigorous. The estimated ejection   fraction was in the range of 65% to 70%. Wall motion was normal;   there were no regional wall motion abnormalities. Doppler   parameters are consistent with abnormal left ventricular   relaxation (grade 1 diastolic  dysfunction). - Aortic valve: There was trivial regurgitation. Past Medical History:  Diagnosis Date   Anemia    Colon cancer (HCC)    Left    Generalized headaches    GERD (gastroesophageal reflux disease)    Hypertension    Stroke [facility])    Visual disturbances     Past Surgical History:  Procedure Laterality Date   BACK SURGERY     BIOPSY  02/16/2023   Procedure: BIOPSY;  Surgeon: Saintclair Jasper, MD;  Location: [facility] ENDOSCOPY;  Service: Gastroenterology;;   CATARACT EXTRACTION W/ INTRAOCULAR LENS IMPLANT Bilateral    COLON RESECTION  09/21/2011   Procedure: COLON RESECTION LAPAROSCOPIC;  Surgeon: Donnice KATHEE Lunger, MD;  Location: [facility];  Service: General;  Laterality: N/A;      COLONOSCOPY  07/03/2011   endo done also   ESOPHAGOGASTRODUODENOSCOPY (EGD) WITH PROPOFOL  N/A 07/25/2022   Procedure: ESOPHAGOGASTRODUODENOSCOPY (EGD) WITH PROPOFOL ;  Surgeon: Saintclair Jasper, MD;  Location: [facility] ENDOSCOPY;  Service: Gastroenterology;  Laterality: N/A;   ESOPHAGOGASTRODUODENOSCOPY (EGD) WITH PROPOFOL  N/A 02/16/2023   Procedure: ESOPHAGOGASTRODUODENOSCOPY (EGD) WITH PROPOFOL ;  Surgeon: Saintclair Jasper, MD;  Location: [facility] ENDOSCOPY;  Service: Gastroenterology;  Laterality: N/A;    MEDICATIONS:  acetaminophen  (TYLENOL ) 325 MG tablet   atorvastatin  (LIPITOR) 40 MG tablet  B Complex-C (B-COMPLEX WITH VITAMIN C) tablet   clopidogrel  (PLAVIX ) 75 MG tablet   ferrous sulfate  325 (65 FE) MG tablet   gabapentin (NEURONTIN) 100 MG capsule   meclizine  (ANTIVERT ) 12.5 MG tablet   pantoprazole  (PROTONIX ) 40 MG tablet   sucralfate  (CARAFATE ) 1 GM/10ML suspension   No current facility-administered medications for this encounter.    Burnard CHRISTELLA Odis DEVONNA [facility] Short Stay/Anesthesiology [facility] Phone [phone] 05/22/2024 9:31 AM       "

## 2024-05-30 ENCOUNTER — Encounter (HOSPITAL_COMMUNITY): Payer: Self-pay | Admitting: Urology

## 2024-05-30 ENCOUNTER — Ambulatory Visit (HOSPITAL_COMMUNITY): Payer: Self-pay | Admitting: Medical

## 2024-05-30 ENCOUNTER — Other Ambulatory Visit (HOSPITAL_COMMUNITY): Payer: Self-pay

## 2024-05-30 ENCOUNTER — Other Ambulatory Visit: Payer: Self-pay

## 2024-05-30 ENCOUNTER — Ambulatory Visit (HOSPITAL_COMMUNITY)

## 2024-05-30 ENCOUNTER — Encounter (HOSPITAL_COMMUNITY)
Admission: RE | Disposition: A | Discharge status: Home / Self Care | Payer: Self-pay | Source: Ambulatory Visit | Attending: Urology

## 2024-05-30 ENCOUNTER — Ambulatory Visit (HOSPITAL_COMMUNITY)
Admission: RE | Admit: 2024-05-30 | Discharge: 2024-05-30 | Disposition: A | Discharge status: Home / Self Care | Source: Ambulatory Visit | Attending: Urology | Admitting: Urology

## 2024-05-30 DIAGNOSIS — K219 Gastro-esophageal reflux disease without esophagitis: Secondary | ICD-10-CM | POA: Insufficient documentation

## 2024-05-30 DIAGNOSIS — D494 Neoplasm of unspecified behavior of bladder: Secondary | ICD-10-CM

## 2024-05-30 DIAGNOSIS — Z87891 Personal history of nicotine dependence: Secondary | ICD-10-CM | POA: Diagnosis not present

## 2024-05-30 DIAGNOSIS — D414 Neoplasm of uncertain behavior of bladder: Secondary | ICD-10-CM | POA: Diagnosis present

## 2024-05-30 DIAGNOSIS — N1832 Chronic kidney disease, stage 3b: Secondary | ICD-10-CM | POA: Insufficient documentation

## 2024-05-30 DIAGNOSIS — I5032 Chronic diastolic (congestive) heart failure: Secondary | ICD-10-CM | POA: Diagnosis not present

## 2024-05-30 DIAGNOSIS — I110 Hypertensive heart disease with heart failure: Secondary | ICD-10-CM

## 2024-05-30 DIAGNOSIS — I6930 Unspecified sequelae of cerebral infarction: Secondary | ICD-10-CM | POA: Insufficient documentation

## 2024-05-30 DIAGNOSIS — C678 Malignant neoplasm of overlapping sites of bladder: Secondary | ICD-10-CM | POA: Diagnosis not present

## 2024-05-30 DIAGNOSIS — I130 Hypertensive heart and chronic kidney disease with heart failure and stage 1 through stage 4 chronic kidney disease, or unspecified chronic kidney disease: Secondary | ICD-10-CM | POA: Diagnosis not present

## 2024-05-30 LAB — URINALYSIS, ROUTINE W REFLEX MICROSCOPIC
Bacteria, UA: NONE SEEN
Bilirubin Urine: NEGATIVE
Glucose, UA: NEGATIVE mg/dL
Ketones, ur: NEGATIVE mg/dL
Nitrite: NEGATIVE
Protein, ur: 100 mg/dL — AB
RBC / HPF: >50 RBC/hpf (ref 0–5)
Specific Gravity, Urine: 1.018 (ref 1.005–1.030)
pH: 5 (ref 5.0–8.0)

## 2024-05-30 SURGERY — TURBT, WITH CHEMOTHERAPEUTIC AGENT INSTILLATION INTO BLADDER
Anesthesia: General

## 2024-05-30 MED ORDER — SUGAMMADEX SODIUM 200 MG/2ML IV SOLN
INTRAVENOUS | Status: DC | PRN
  Administered 2024-05-30: 255 mg via INTRAVENOUS

## 2024-05-30 MED ORDER — LIDOCAINE HCL (CARDIAC) PF 100 MG/5ML IV SOSY
PREFILLED_SYRINGE | INTRAVENOUS | Status: DC | PRN
  Administered 2024-05-30: 100 mg via INTRAVENOUS

## 2024-05-30 MED ORDER — SUGAMMADEX SODIUM 200 MG/2ML IV SOLN
INTRAVENOUS | Status: AC
  Filled 2024-05-30: qty 2

## 2024-05-30 MED ORDER — EPHEDRINE SULFATE (PRESSORS) 25 MG/5ML IV SOSY
PREFILLED_SYRINGE | INTRAVENOUS | Status: DC | PRN
  Administered 2024-05-30 (×2): 5 mg via INTRAVENOUS

## 2024-05-30 MED ORDER — STERILE WATER FOR IRRIGATION IR SOLN
Status: DC | PRN
  Administered 2024-05-30: 1000 mL

## 2024-05-30 MED ORDER — ROCURONIUM BROMIDE 10 MG/ML (PF) SYRINGE
PREFILLED_SYRINGE | INTRAVENOUS | Status: AC
  Filled 2024-05-30: qty 10

## 2024-05-30 MED ORDER — FENTANYL CITRATE (PF) 50 MCG/ML IJ SOSY: 25.0000 ug | PREFILLED_SYRINGE | INTRAMUSCULAR | Status: DC | PRN

## 2024-05-30 MED ORDER — ORAL CARE MOUTH RINSE: 15.0000 mL | Freq: Once | OROMUCOSAL | Status: AC

## 2024-05-30 MED ORDER — OXYCODONE HCL 5 MG PO TABS: 5.0000 mg | ORAL_TABLET | Freq: Once | ORAL | Status: DC | PRN

## 2024-05-30 MED ORDER — CHLORHEXIDINE GLUCONATE 0.12 % MT SOLN
15.0000 mL | Freq: Once | OROMUCOSAL | Status: AC
  Administered 2024-05-30: 15 mL via OROMUCOSAL

## 2024-05-30 MED ORDER — SODIUM CHLORIDE 0.9 % IR SOLN: Status: DC | PRN

## 2024-05-30 MED ORDER — DEXAMETHASONE SOD PHOSPHATE PF 10 MG/ML IJ SOLN
INTRAMUSCULAR | Status: DC | PRN
  Administered 2024-05-30: 5 mg via INTRAVENOUS

## 2024-05-30 MED ORDER — SODIUM CHLORIDE 0.9 % IR SOLN
Status: DC | PRN
  Administered 2024-05-30 (×3): 3000 mL

## 2024-05-30 MED ORDER — CEPHALEXIN 500 MG PO CAPS
500.0000 mg | ORAL_CAPSULE | Freq: Every day | ORAL | 0 refills | Status: DC
  Filled 2024-05-30 (×2): qty 5, 5d supply, fill #0

## 2024-05-30 MED ORDER — PROPOFOL 10 MG/ML IV BOLUS
INTRAVENOUS | Status: AC
  Filled 2024-05-30: qty 20

## 2024-05-30 MED ORDER — ONDANSETRON HCL 4 MG/2ML IJ SOLN
INTRAMUSCULAR | Status: AC
  Filled 2024-05-30: qty 2

## 2024-05-30 MED ORDER — PROPOFOL 10 MG/ML IV BOLUS
INTRAVENOUS | Status: DC | PRN
  Administered 2024-05-30: 60 mg via INTRAVENOUS

## 2024-05-30 MED ORDER — EPHEDRINE 5 MG/ML INJ
INTRAVENOUS | Status: AC
  Filled 2024-05-30: qty 5

## 2024-05-30 MED ORDER — ONDANSETRON HCL 4 MG/2ML IJ SOLN: 4.0000 mg | Freq: Once | INTRAMUSCULAR | Status: DC | PRN

## 2024-05-30 MED ORDER — 0.9 % SODIUM CHLORIDE (POUR BTL) OPTIME
TOPICAL | Status: DC | PRN
  Administered 2024-05-30: 1000 mL

## 2024-05-30 MED ORDER — LIDOCAINE HCL (PF) 2 % IJ SOLN
INTRAMUSCULAR | Status: AC
  Filled 2024-05-30: qty 5

## 2024-05-30 MED ORDER — GEMCITABINE CHEMO FOR BLADDER INSTILLATION 2000 MG
2000.0000 mg | Freq: Once | INTRAVENOUS | Status: DC
  Filled 2024-05-30: qty 52.6

## 2024-05-30 MED ORDER — LACTATED RINGERS IV SOLN: INTRAVENOUS | Status: DC

## 2024-05-30 MED ORDER — ACETAMINOPHEN 10 MG/ML IV SOLN: 1000.0000 mg | Freq: Once | INTRAVENOUS | Status: DC | PRN

## 2024-05-30 MED ORDER — FENTANYL CITRATE (PF) 100 MCG/2ML IJ SOLN
INTRAMUSCULAR | Status: AC
  Filled 2024-05-30: qty 2

## 2024-05-30 MED ORDER — OXYCODONE HCL 5 MG/5ML PO SOLN: 5.0000 mg | Freq: Once | ORAL | Status: DC | PRN

## 2024-05-30 MED ORDER — FENTANYL CITRATE (PF) 100 MCG/2ML IJ SOLN
INTRAMUSCULAR | Status: DC | PRN
  Administered 2024-05-30: 25 ug via INTRAVENOUS
  Administered 2024-05-30: 50 ug via INTRAVENOUS
  Administered 2024-05-30: 25 ug via INTRAVENOUS

## 2024-05-30 MED ORDER — CEFAZOLIN SODIUM-DEXTROSE 2-4 GM/100ML-% IV SOLN
2.0000 g | Freq: Once | INTRAVENOUS | Status: AC
  Administered 2024-05-30: 2 g via INTRAVENOUS
  Filled 2024-05-30: qty 100

## 2024-05-30 MED ORDER — ROCURONIUM BROMIDE 100 MG/10ML IV SOLN
INTRAVENOUS | Status: DC | PRN
  Administered 2024-05-30: 50 mg via INTRAVENOUS
  Administered 2024-05-30: 10 mg via INTRAVENOUS

## 2024-05-30 MED ORDER — ONDANSETRON HCL 4 MG/2ML IJ SOLN
INTRAMUSCULAR | Status: DC | PRN
  Administered 2024-05-30: 4 mg via INTRAVENOUS

## 2024-05-30 SURGICAL SUPPLY — 16 items
BAG URINE DRAIN 2000ML AR STRL (UROLOGICAL SUPPLIES) IMPLANT
BAG URO CATCHER STRL LF (MISCELLANEOUS) ×1 IMPLANT
CATH FOLEY 2WAY 5CC 20FR (CATHETERS) IMPLANT
CLOTH BEACON ORANGE TIMEOUT ST (SAFETY) ×1 IMPLANT
DRAPE FOOT SWITCH (DRAPES) ×1 IMPLANT
ELECT REM PT RETURN 15FT ADLT (MISCELLANEOUS) IMPLANT
GLOVE BIO SURGEON STRL SZ 6.5 (GLOVE) ×1 IMPLANT
GOWN STRL REUS W/ TWL LRG LVL3 (GOWN DISPOSABLE) ×1 IMPLANT
KIT TURNOVER KIT A (KITS) ×1 IMPLANT
LOOP CUT BIPOLAR 24F LRG (ELECTROSURGICAL) IMPLANT
MANIFOLD NEPTUNE II (INSTRUMENTS) ×1 IMPLANT
PACK CYSTO (CUSTOM PROCEDURE TRAY) ×1 IMPLANT
PAD PREP 24X48 CUFFED NSTRL (MISCELLANEOUS) ×1 IMPLANT
SYRINGE TOOMEY IRRIG 70ML (MISCELLANEOUS) IMPLANT
TUBING CONNECTING 10 (TUBING) ×1 IMPLANT
TUBING UROLOGY SET (TUBING) ×1 IMPLANT

## 2024-05-30 NOTE — Transfer of Care (Signed)
 Immediate Anesthesia Transfer of Care Note  Patient: William Patel  Procedure(s) Performed: TURBT, WITH CHEMOTHERAPEUTIC AGENT INSTILLATION INTO BLADDER  Patient Location: PACU  Anesthesia Type:General  Level of Consciousness: drowsy  Airway & Oxygen Therapy: Patient Spontanous Breathing and Patient connected to nasal cannula oxygen  Post-op Assessment: Report given to RN and Post -op Vital signs reviewed and stable  Post vital signs: Reviewed and stable  Last Vitals:  Vitals Value Taken Time  BP 160/79 05/30/24 16:15  Temp    Pulse 68 05/30/24 16:17  Resp 14 05/30/24 16:17  SpO2 100 % 05/30/24 16:17  Vitals shown include unfiled device data.  Last Pain:  Vitals:   05/30/24 1252  TempSrc: Oral         Complications: No notable events documented.

## 2024-05-30 NOTE — Anesthesia Procedure Notes (Signed)
 Procedure Name: Intubation Date/Time: 05/30/2024 3:20 PM  Performed by: Belvie Valri NOVAK, CRNAPre-anesthesia Checklist: Patient identified, Emergency Drugs available, Suction available and Patient being monitored Patient Re-evaluated:Patient Re-evaluated prior to induction Oxygen Delivery Method: Circle System Utilized Preoxygenation: Pre-oxygenation with 100% oxygen Induction Type: IV induction Ventilation: Mask ventilation without difficulty and Oral airway inserted - appropriate to patient size Laryngoscope Size: Glidescope and 4 Grade View: Grade I Tube type: Oral Number of attempts: 1 Airway Equipment and Method: Stylet and Oral airway Placement Confirmation: ETT inserted through vocal cords under direct vision, positive ETCO2 and breath sounds checked- equal and bilateral Secured at: 21 cm Tube secured with: Tape Dental Injury: Teeth and Oropharynx as per pre-operative assessment

## 2024-05-30 NOTE — Op Note (Addendum)
 PATIENT:  William Patel  PRE-OPERATIVE DIAGNOSIS: Bladder tumor  POST-OPERATIVE DIAGNOSIS: Same  PROCEDURE:  Procedure(s): 1. TRANSURETHRAL RESECTION OF BLADDER TUMOR (TURBT) (7cm.)   SURGEON:  Valli Shank, MD  ANESTHESIA:   General  EBL:  less than 100 mL  DRAINS: Urethral catheter (20 Fr. Foley)   SPECIMEN:  Bladder tumor  FINDINGS: Normal anterior urethra Lateral lobe prostatic hypertrophy Bilateral orthotopic Uos.  Right UO with early papillary growth surrounding it. Extensive papillary bladder mass approx 7 cm  DISPOSITION OF SPECIMEN:  PATHOLOGY  Indication:  89 yo man who presented with gross hematuria found to have a bladder mass concerning for bladder cancer.  Description of operation: The patient was taken to the operating room and administered general anesthesia. They were then placed on the table and moved to the dorsal lithotomy position after which the genitalia was sterilely prepped and draped. An official timeout was then performed.  The 32 French resectoscope with the 30 lens and visual obturator were then passed into the bladder under direct visualization. Urethra appeared normal. The visual obturator was then removed and the Gyrus resectoscope element with 30  lens was then inserted and the bladder was fully and systematically inspected. Ureteral orifices were noted to be in the normal anatomic positions.   I first began by resecting papillary bladder mass posterior to right UO.  The bladder tumor extending to right lateral, anterior wall and bladder neck. Resection continued until most of the papillary tumor had been removed.   Reinspection of the bladder revealed all obvious tumor had been fully resected and there was no evidence of perforation. The The toomey syringe was then used to irrigate the bladder and remove all of the portions of bladder tumor which were sent to pathology. I then removed the resectoscope.  A 20 French Foley catheter was then  inserted in the bladder and irrigated. The irrigant returned slightly pink with no clots. The patient was awakened and taken to the recovery room.  Due to the extent of the resection and level of resection, decided not to proceed with gemcitabine.  PLAN OF CARE: Discharge to home after PACU  PATIENT DISPOSITION:  PACU - hemodynamically stable.

## 2024-05-30 NOTE — H&P (Signed)
 "    History of present illness: 89 yo man with gross hematuria found to have bladder tumor here for surgery.   Review of systems: A 12 point comprehensive review of systems was obtained and is negative unless otherwise stated in the history of present illness.  Patient Active Problem List   Diagnosis Date Noted   Hematemesis 02/15/2023   CKD stage 3b, GFR 30-44 ml/min (HCC) 02/15/2023   Peptic ulcer disease 02/15/2023   Upper GI bleed 07/24/2022   History of cerebrovascular accident (CVA) with residual deficit 07/24/2022   Carotid stenosis 08/08/2017   Smoker 08/08/2017   HLD (hyperlipidemia) 08/08/2017   Stroke (cerebrum) (HCC) 08/06/2017   AKI (acute kidney injury) 03/27/2016   Chronic kidney disease, stage 3a (HCC) 03/27/2016   Hypertension 03/27/2016   Chronic diastolic CHF (congestive heart failure) (HCC) 03/27/2016   Chest pain, midsternal 06/14/2012   Abdominal pain- LLQ 06/14/2012   Near syncope 06/14/2012   Vertigo 06/14/2012   Diverticulitis of sigmoid colon 06/14/2012   PVC (premature ventricular contraction) 08/13/2011   Pre-operative cardiovascular examination 08/13/2011   Colon cancer-proximal descending left colon 08/06/2011   Tobacco use-chews 08/06/2011    Medications Ordered Prior to Encounter[1]  Past Medical History:  Diagnosis Date   Anemia    Colon cancer (HCC)    Left    Generalized headaches    GERD (gastroesophageal reflux disease)    Hypertension    Stroke (HCC)    Visual disturbances     Past Surgical History:  Procedure Laterality Date   BACK SURGERY     BIOPSY  02/16/2023   Procedure: BIOPSY;  Surgeon: Saintclair Jasper, MD;  Location: [facility] ENDOSCOPY;  Service: Gastroenterology;;   CATARACT EXTRACTION W/ INTRAOCULAR LENS IMPLANT Bilateral    COLON RESECTION  09/21/2011   Procedure: COLON RESECTION LAPAROSCOPIC;  Surgeon: Donnice KATHEE Lunger, MD;  Location: [facility] ORS;  Service: General;  Laterality: N/A;      COLONOSCOPY  07/03/2011   endo done  also   ESOPHAGOGASTRODUODENOSCOPY (EGD) WITH PROPOFOL  N/A 07/25/2022   Procedure: ESOPHAGOGASTRODUODENOSCOPY (EGD) WITH PROPOFOL ;  Surgeon: Saintclair Jasper, MD;  Location: [facility] ENDOSCOPY;  Service: Gastroenterology;  Laterality: N/A;   ESOPHAGOGASTRODUODENOSCOPY (EGD) WITH PROPOFOL  N/A 02/16/2023   Procedure: ESOPHAGOGASTRODUODENOSCOPY (EGD) WITH PROPOFOL ;  Surgeon: Saintclair Jasper, MD;  Location: [facility] ENDOSCOPY;  Service: Gastroenterology;  Laterality: N/A;    Social History[2]  Family History  Problem Relation Age of Onset   Heart failure Mother    Brain cancer Father    Stroke Brother     PE: Vitals:   05/30/24 1251 05/30/24 1252  BP:  (!) 144/73  Pulse:  61  Resp:  16  Temp:  98.1 F (36.7 C)  TempSrc:  Oral  SpO2:  98%  Weight: 63.9 kg   Height: 5' 2.75 (1.594 m)    Patient appears to be in no acute distress  patient is alert and oriented x3 Atraumatic normocephalic head No increased work of breathing, no audible wheezes/rhonchi Regular sinus rhythm/rate Abdomen is soft, nontender, nondistended, no CVA or suprapubic tenderness Lower extremities are symmetric without appreciable edema No identifiable skin lesions  No results for input(s): WBC, HGB, HCT in the last 72 hours. No results for input(s): NA, K, CL, CO2, GLUCOSE, BUN, CREATININE, CALCIUM  in the last 72 hours. No results for input(s): LABPT, INR in the last 72 hours. No results for input(s): LABURIN in the last 72 hours. Results for orders placed or performed during the hospital encounter of 09/20/20  Resp Panel by RT-PCR (Flu A&B, Covid) Nasopharyngeal Swab     Status: None   Collection Time: 09/20/20 11:21 AM   Specimen: Nasopharyngeal Swab; Nasopharyngeal(NP) swabs in vial transport medium  Result Value Ref Range Status   SARS Coronavirus 2 by RT PCR NEGATIVE NEGATIVE Final    Comment: (NOTE) SARS-CoV-2 target nucleic acids are NOT DETECTED.  The SARS-CoV-2 RNA is generally  detectable in upper respiratory specimens during the acute phase of infection. The lowest concentration of SARS-CoV-2 viral copies this assay can detect is 138 copies/mL. A negative result does not preclude SARS-Cov-2 infection and should not be used as the sole basis for treatment or other patient management decisions. A negative result may occur with  improper specimen collection/handling, submission of specimen other than nasopharyngeal swab, presence of viral mutation(s) within the areas targeted by this assay, and inadequate number of viral copies(<138 copies/mL). A negative result must be combined with clinical observations, patient history, and epidemiological information. The expected result is Negative.  Fact Sheet for Patients:  bloggercourse.com  Fact Sheet for Healthcare Providers:  seriousbroker.it  This test is no t yet approved or cleared by the [place]  FDA and  has been authorized for detection and/or diagnosis of SARS-CoV-2 by FDA under an Emergency Use Authorization (EUA). This EUA will remain  in effect (meaning this test can be used) for the duration of the COVID-19 declaration under Section 564(b)(1) of the Act, 21 U.S.C.section 360bbb-3(b)(1), unless the authorization is terminated  or revoked sooner.       Influenza A by PCR NEGATIVE NEGATIVE Final   Influenza B by PCR NEGATIVE NEGATIVE Final    Comment: (NOTE) The Xpert Xpress SARS-CoV-2/FLU/RSV plus assay is intended as an aid in the diagnosis of influenza from Nasopharyngeal swab specimens and should not be used as a sole basis for treatment. Nasal washings and aspirates are unacceptable for Xpert Xpress SARS-CoV-2/FLU/RSV testing.  Fact Sheet for Patients: bloggercourse.com  Fact Sheet for Healthcare Providers: seriousbroker.it  This test is not yet approved or cleared by the [place]  FDA  and has been authorized for detection and/or diagnosis of SARS-CoV-2 by FDA under an Emergency Use Authorization (EUA). This EUA will remain in effect (meaning this test can be used) for the duration of the COVID-19 declaration under Section 564(b)(1) of the Act, 21 U.S.C. section 360bbb-3(b)(1), unless the authorization is terminated or revoked.  Performed at [facility], [address] [place], [place] [address]     A/P: Bladder mass: - Risks and benefits of transurethral resection of bladder tumor discussed with patient and informed was obtained - Please see office note for further detail   ith any further questions or concerns. Acen Craun D Zetta Stoneman       [1]  No current facility-administered medications on file prior to encounter.   Current Outpatient Medications on File Prior to Encounter  Medication Sig Dispense Refill   acetaminophen  (TYLENOL ) 325 MG tablet Take 650 mg by mouth every 4 (four) hours as needed for mild pain, fever or headache.     atorvastatin  (LIPITOR) 40 MG tablet Take 1 tablet (40 mg total) by mouth daily. 90 tablet 4   clopidogrel  (PLAVIX ) 75 MG tablet Take 1 tablet (75 mg total) by mouth daily. 90 tablet 3   gabapentin (NEURONTIN) 100 MG capsule Take 100-300 mg by mouth at bedtime.     meclizine  (ANTIVERT ) 12.5 MG tablet Take 12.5 mg by mouth 3 (three) times daily as needed (vertigo).  pantoprazole  (PROTONIX ) 40 MG tablet Take 1 tablet (40 mg total) by mouth TWICE daily for 1 month until this prescription ends 10/17 ... Then take ONCE daily indefinitely 60 tablet 0   B Complex-C (B-COMPLEX WITH VITAMIN C) tablet Take 1 tablet by mouth daily. (Patient not taking: Reported on 05/18/2024) 30 tablet 0   ferrous sulfate  325 (65 FE) MG tablet Take 1 tablet (325 mg total) by mouth daily with breakfast. (Patient not taking: Reported on 02/15/2023) 90 tablet 0   sucralfate  (CARAFATE ) 1 GM/10ML suspension Take 10 mLs (1 g total) by mouth 4  (four) times daily -  with meals and at bedtime. (Patient not taking: Reported on 05/18/2024) 420 mL 0  [2]  Social History Tobacco Use   Smoking status: Former    Types: Cigars   Smokeless tobacco: Current    Types: Chew   Tobacco comments:    1 to 2 per month  Vaping Use   Vaping status: Never Used  Substance Use Topics   Alcohol use: Yes    Comment: Rare   Drug use: No   "

## 2024-05-30 NOTE — Progress Notes (Signed)
 Patient and wife verbalize understanding of catheter

## 2024-05-30 NOTE — Anesthesia Postprocedure Evaluation (Signed)
"   Anesthesia Post Note  Patient: William Patel  Procedure(s) Performed: TURBT, WITH CHEMOTHERAPEUTIC AGENT INSTILLATION INTO BLADDER     Patient location during evaluation: PACU Anesthesia Type: General Level of consciousness: awake and alert Pain management: pain level controlled Vital Signs Assessment: post-procedure vital signs reviewed and stable Respiratory status: spontaneous breathing, nonlabored ventilation, respiratory function stable and patient connected to nasal cannula oxygen Cardiovascular status: blood pressure returned to baseline and stable Postop Assessment: no apparent nausea or vomiting Anesthetic complications: no   No notable events documented.  Last Vitals:  Vitals:   05/30/24 1645 05/30/24 1702  BP: (!) 164/83 (!) 152/82  Pulse: 63 68  Resp: 15 20  Temp: (!) 36 C 36.7 C  SpO2: 100% 96%    Last Pain:  Vitals:   05/30/24 1702  TempSrc: Oral  PainSc: 0-No pain                 William Patel      "

## 2024-05-30 NOTE — Discharge Instructions (Signed)
 Transurethral Resection of Bladder Tumor (TURBT)   Definition:  Transurethral Resection of the Bladder Tumor is a surgical procedure used to diagnose and remove tumors within the bladder. TURBT is the most common treatment for early stage bladder cancer.  General instructions:     Your recent bladder surgery requires very little post hospital care but some definite precautions.  Despite the fact that no skin incisions were used, the area around the bladder incisions are raw and covered with scabs to promote healing and prevent bleeding. Certain precautions are needed to insure that the scabs are not disturbed over the next 2-4 weeks while the healing proceeds.  Because the raw surface inside your bladder and the irritating effects of urine you may expect frequency of urination and/or urgency (a stronger desire to urinate) and perhaps even getting up at night more often. This will usually resolve or improve slowly over the healing period. You may see some blood in your urine over the first 6 weeks. Do not be alarmed, even if the urine was clear for a while. Get off your feet and drink lots of fluids until clearing occurs. If you start to pass clots or don't improve call us .  Catheter: (If you are discharged with a catheter.)  1. Keep your catheter secured to your leg at all times with tape or the supplied strap. 2. You may experience leakage of urine around your catheter- as long as the  catheter continues to drain, this is normal.  If your catheter stops draining  go to the ER. 3. You may also have blood in your urine, even after it has been clear for  several days; you may even pass some small blood clots or other material.  This  is normal as well.  If this happens, sit down and drink plenty of water  to help  make urine to flush out your bladder.  If the blood in your urine becomes worse  after doing this, contact our office or return to the ER. 4. You may use the leg bag (small bag)  during the day, but use the large bag at  night.  Diet:  You may return to your normal diet immediately. Because of the raw surface of your bladder, alcohol, spicy foods, foods high in acid and drinks with caffeine may cause irritation or frequency and should be used in moderation. To keep your urine flowing freely and avoid constipation, drink plenty of fluids during the day (8-10 glasses). Tip: Avoid cranberry juice because it is very acidic.  Activity:  Your physical activity doesn't need to be restricted. However, if you are very active, you may see some blood in the urine. We suggest that you reduce your activity under the circumstances until the bleeding has stopped.  Bowels:  It is important to keep your bowels regular during the postoperative period. Straining with bowel movements can cause bleeding. A bowel movement every other day is reasonable. Use a mild laxative if needed, such as milk of magnesia 2-3 tablespoons, or 2 Dulcolax tablets. Call if you continue to have problems. If you had been taking narcotics for pain, before, during or after your surgery, you may be constipated. Take a laxative if necessary.    Medication:  You should resume your pre-surgery medications unless told not to. In addition you may be given an antibiotic to prevent or treat infection. Antibiotics are not always necessary. All medication should be taken as prescribed until the bottles are finished unless you are having  an unusual reaction to one of the drugs.    DO NOT RESTART PLAVIX  until urine has cleared (Friday at the earliest).

## 2024-05-30 NOTE — Anesthesia Preprocedure Evaluation (Signed)
"                                    Anesthesia Evaluation  Patient identified by MRN, date of birth, ID band Patient awake    Reviewed: Allergy & Precautions, NPO status , Patient's Chart, lab work & pertinent test results, reviewed documented beta blocker date and time   History of Anesthesia Complications Negative for: history of anesthetic complications  Airway Mallampati: II  TM Distance: >3 FB     Dental  (+) Edentulous Upper, Edentulous Lower   Pulmonary neg COPD, former smoker   breath sounds clear to auscultation       Cardiovascular hypertension, (-) angina +CHF  (-) CAD and (-) Past MI  Rhythm:Regular Rate:Normal  Right Carotid: Velocities in the right ICA are consistent with a 1-39%  stenosis.   Left Carotid: Velocities in the left ICA are consistent with a 1-39%  stenosis.   Study Conclusions   - Left ventricle: The cavity size was normal. Wall thickness was    normal. Systolic function was vigorous. The estimated ejection    fraction was in the range of 65% to 70%. Wall motion was normal;    there were no regional wall motion abnormalities. Doppler    parameters are consistent with abnormal left ventricular    relaxation (grade 1 diastolic dysfunction).  - Aortic valve: There was trivial regurgitation.     Neuro/Psych  Headaches, neg Seizures CVA, Residual Symptoms    GI/Hepatic PUD,GERD  ,,(+) neg Cirrhosis        Endo/Other    Renal/GU Renal hypertension and CRFRenal disease     Musculoskeletal   Abdominal   Peds  Hematology  (+) Blood dyscrasia, anemia   Anesthesia Other Findings   Reproductive/Obstetrics                              Anesthesia Physical Anesthesia Plan  ASA: 3  Anesthesia Plan: General   Post-op Pain Management:    Induction:   PONV Risk Score and Plan: 2 and Ondansetron  and Dexamethasone   Airway Management Planned: Oral ETT  Additional Equipment:   Intra-op Plan:    Post-operative Plan: Extubation in OR  Informed Consent: I have reviewed the patients History and Physical, chart, labs and discussed the procedure including the risks, benefits and alternatives for the proposed anesthesia with the patient or authorized representative who has indicated his/her understanding and acceptance.     Dental advisory given  Plan Discussed with: CRNA  Anesthesia Plan Comments:          Anesthesia Quick Evaluation  "

## 2024-05-31 ENCOUNTER — Other Ambulatory Visit (HOSPITAL_COMMUNITY): Payer: Self-pay

## 2024-06-02 LAB — SURGICAL PATHOLOGY

## 2024-06-28 ENCOUNTER — Emergency Department (HOSPITAL_COMMUNITY)

## 2024-06-28 ENCOUNTER — Other Ambulatory Visit: Payer: Self-pay

## 2024-06-28 ENCOUNTER — Encounter (HOSPITAL_COMMUNITY): Payer: Self-pay

## 2024-06-28 ENCOUNTER — Emergency Department (HOSPITAL_COMMUNITY)
Admission: EM | Admit: 2024-06-28 | Discharge: 2024-06-28 | Disposition: A | Discharge status: Home / Self Care | Attending: Emergency Medicine | Admitting: Emergency Medicine

## 2024-06-28 DIAGNOSIS — Z85038 Personal history of other malignant neoplasm of large intestine: Secondary | ICD-10-CM | POA: Insufficient documentation

## 2024-06-28 DIAGNOSIS — I10 Essential (primary) hypertension: Secondary | ICD-10-CM | POA: Insufficient documentation

## 2024-06-28 DIAGNOSIS — R1031 Right lower quadrant pain: Secondary | ICD-10-CM

## 2024-06-28 DIAGNOSIS — D494 Neoplasm of unspecified behavior of bladder: Secondary | ICD-10-CM

## 2024-06-28 DIAGNOSIS — Z87891 Personal history of nicotine dependence: Secondary | ICD-10-CM | POA: Insufficient documentation

## 2024-06-28 DIAGNOSIS — D303 Benign neoplasm of bladder: Secondary | ICD-10-CM | POA: Diagnosis not present

## 2024-06-28 DIAGNOSIS — Z8673 Personal history of transient ischemic attack (TIA), and cerebral infarction without residual deficits: Secondary | ICD-10-CM | POA: Diagnosis not present

## 2024-06-28 DIAGNOSIS — N1330 Unspecified hydronephrosis: Secondary | ICD-10-CM | POA: Insufficient documentation

## 2024-06-28 DIAGNOSIS — Z8551 Personal history of malignant neoplasm of bladder: Secondary | ICD-10-CM | POA: Diagnosis not present

## 2024-06-28 LAB — COMPREHENSIVE METABOLIC PANEL WITH GFR
ALT: 8 U/L (ref 0–44)
AST: 16 U/L (ref 15–41)
Albumin: 4.6 g/dL (ref 3.5–5.0)
Alkaline Phosphatase: 92 U/L (ref 38–126)
Anion gap: 14 (ref 5–15)
BUN: 27 mg/dL — ABNORMAL HIGH (ref 8–23)
CO2: 20 mmol/L — ABNORMAL LOW (ref 22–32)
Calcium: 9.7 mg/dL (ref 8.9–10.3)
Chloride: 106 mmol/L (ref 98–111)
Creatinine, Ser: 2.02 mg/dL — ABNORMAL HIGH (ref 0.61–1.24)
GFR, Estimated: 31 mL/min — ABNORMAL LOW
Glucose, Bld: 107 mg/dL — ABNORMAL HIGH (ref 70–99)
Potassium: 4 mmol/L (ref 3.5–5.1)
Sodium: 140 mmol/L (ref 135–145)
Total Bilirubin: 0.5 mg/dL (ref 0.0–1.2)
Total Protein: 7.5 g/dL (ref 6.5–8.1)

## 2024-06-28 LAB — URINALYSIS, W/ REFLEX TO CULTURE (INFECTION SUSPECTED)
Bilirubin Urine: NEGATIVE
Glucose, UA: NEGATIVE mg/dL
Ketones, ur: NEGATIVE mg/dL
Nitrite: NEGATIVE
Protein, ur: 30 mg/dL — AB
Specific Gravity, Urine: 1.014 (ref 1.005–1.030)
WBC, UA: >50 WBC/hpf (ref 0–5)
pH: 5 (ref 5.0–8.0)

## 2024-06-28 LAB — CBC WITH DIFFERENTIAL/PLATELET
Abs Immature Granulocytes: 0.07 10*3/uL (ref 0.00–0.07)
Basophils Absolute: 0 10*3/uL (ref 0.0–0.1)
Basophils Relative: 0 %
Eosinophils Absolute: 0 10*3/uL (ref 0.0–0.5)
Eosinophils Relative: 0 %
HCT: 38.5 % — ABNORMAL LOW (ref 39.0–52.0)
Hemoglobin: 12 g/dL — ABNORMAL LOW (ref 13.0–17.0)
Immature Granulocytes: 0 %
Lymphocytes Relative: 18 %
Lymphs Abs: 3 10*3/uL (ref 0.7–4.0)
MCH: 28.7 pg (ref 26.0–34.0)
MCHC: 31.2 g/dL (ref 30.0–36.0)
MCV: 92.1 fL (ref 80.0–100.0)
Monocytes Absolute: 1.4 10*3/uL — ABNORMAL HIGH (ref 0.1–1.0)
Monocytes Relative: 9 %
Neutro Abs: 11.8 10*3/uL — ABNORMAL HIGH (ref 1.7–7.7)
Neutrophils Relative %: 73 %
Platelets: 241 10*3/uL (ref 150–400)
RBC: 4.18 MIL/uL — ABNORMAL LOW (ref 4.22–5.81)
RDW: 12.8 % (ref 11.5–15.5)
WBC: 16.3 10*3/uL — ABNORMAL HIGH (ref 4.0–10.5)
nRBC: 0 % (ref 0.0–0.2)

## 2024-06-28 LAB — LIPASE, BLOOD: Lipase: 35 U/L (ref 11–51)

## 2024-06-28 MED ORDER — MORPHINE SULFATE (PF) 2 MG/ML IV SOLN
2.0000 mg | Freq: Once | INTRAVENOUS | Status: AC
  Administered 2024-06-28: 2 mg via INTRAVENOUS
  Filled 2024-06-28: qty 1

## 2024-06-28 MED ORDER — SODIUM CHLORIDE 0.9 % IV SOLN
1.0000 g | Freq: Once | INTRAVENOUS | Status: AC
  Administered 2024-06-28: 1 g via INTRAVENOUS
  Filled 2024-06-28: qty 10

## 2024-06-28 MED ORDER — ACETAMINOPHEN 500 MG PO TABS
1000.0000 mg | ORAL_TABLET | Freq: Once | ORAL | Status: AC
  Administered 2024-06-28: 1000 mg via ORAL
  Filled 2024-06-28: qty 2

## 2024-06-28 MED ORDER — IOHEXOL 300 MG/ML  SOLN
80.0000 mL | Freq: Once | INTRAMUSCULAR | Status: AC | PRN
  Administered 2024-06-28: 80 mL via INTRAVENOUS

## 2024-06-28 NOTE — ED Notes (Signed)
 [facility] called. Notified spouse of patient, William Patel, at [phone]

## 2024-06-28 NOTE — Discharge Instructions (Signed)
 Your right-sided abdominal pain is likely secondary to swelling of your ureter and kidney on the right side secondary to reflux from an obstruction in the bladder likely another tumor.  We spoke with Dr. Elisabeth who wanted to call the office tomorrow morning to establish close follow-up.  They recommend a repeat elective, outpatient cystoscopy, with possible repeat TURBT, and attempted right ureteral stent placement.  If retrograde ureteral stent placement is unsuccessful, he may require a right sided nephrostomy tube.

## 2024-06-28 NOTE — ED Provider Notes (Signed)
 " [place] [facility] DEPARTMENT AT [facility] Provider Note   CSN: [id number] Arrival date & time: 06/28/24  9048     History  Chief Complaint  Patient presents with   Abdominal Pain    William Patel is a 89 y.o. male with PMH as listed below who presents BIBA coming from home with right lower abdominal pain. Patient with h/o colon cancer, in the proximal descending left colon, UTI/bladder cancer/prior stroke with right sided deficits. He denies nausea vomiting, urinary symptoms.  Denies diarrhea or constipation.  He tried a lidocaine  patch in his right lower quadrant.  Denies fever/chills.  Does endorse some testicular pain but no trauma.  Pain seems to be rating down into the testicle.  Denies any history of hernia.  152/84 98% RA HR 85.    Past Medical History:  Diagnosis Date   Anemia    Colon cancer (HCC)    Left    Generalized headaches    GERD (gastroesophageal reflux disease)    Hypertension    Stroke (HCC)    Visual disturbances        Home Medications Prior to Admission medications  Medication Sig Start Date End Date Taking? Authorizing Provider  acetaminophen  (TYLENOL ) 325 MG tablet Take 650 mg by mouth every 4 (four) hours as needed for mild pain, fever or headache.    [provider]  atorvastatin  (LIPITOR) 40 MG tablet Take 1 tablet (40 mg total) by mouth daily. 12/21/17   Whitfield Raisin, NP  B Complex-C (B-COMPLEX WITH VITAMIN C) tablet Take 1 tablet by mouth daily. Patient not taking: Reported on 05/18/2024 07/25/22   Tobie Yetta HERO, MD  cephALEXin  (KEFLEX ) 500 MG capsule Take 1 capsule (500 mg total) by mouth daily. 05/30/24   Pace, Maryellen D, MD  [Paused] clopidogrel  (PLAVIX ) 75 MG tablet Take 1 tablet (75 mg total) by mouth daily. Wait to take this until your doctor or other care provider tells you to start again. 07/31/22   Tobie Yetta HERO, MD  ferrous sulfate  325 (65 FE) MG tablet Take 1 tablet (325 mg total) by mouth daily with  breakfast. Patient not taking: Reported on 02/15/2023 07/25/22   Tobie Yetta HERO, MD  gabapentin  (NEURONTIN ) 100 MG capsule Take 100-300 mg by mouth at bedtime. 05/04/24   [provider]  meclizine  (ANTIVERT ) 12.5 MG tablet Take 12.5 mg by mouth 3 (three) times daily as needed (vertigo).    [provider]  pantoprazole  (PROTONIX ) 40 MG tablet Take 1 tablet (40 mg total) by mouth TWICE daily for 1 month until this prescription ends 10/17 ... Then take ONCE daily indefinitely 02/16/23 05/30/24  McCarty, Artie, MD  sucralfate  (CARAFATE ) 1 GM/10ML suspension Take 10 mLs (1 g total) by mouth 4 (four) times daily -  with meals and at bedtime. Patient not taking: Reported on 05/18/2024 02/16/23   Cornelius Dines, MD      Allergies    Patient has no known allergies.    Review of Systems   Review of Systems A 10 point review of systems was performed and is negative unless otherwise reported in HPI.  Physical Exam Updated Vital Signs BP (!) 154/90 (BP Location: Left Arm)   Pulse 90   Temp 98.9 F (37.2 C) (Oral)   Resp 18   SpO2 99%  Physical Exam General: Normal appearing male, lying in bed.  HEENT: PERRLA, Sclera anicteric, MMM, trachea midline.  Cardiology: RRR, no murmurs/rubs/gallops. BL radial and DP pulses equal  bilaterally.  Resp: Normal respiratory rate and effort. CTAB, no wheezes, rhonchi, crackles.  Abd: Soft, diffuse abdominal tenderness with right lower quadrant the worst., non-distended. No rebound tenderness or guarding.  GU: Normal-appearing circumcised genitalia.  Mild right testicular tenderness with no abnormal lie or testicular mass.  No overlying skin changes, erythema, induration, fluctuance, ulcers.  MSK: No peripheral edema or signs of trauma. Extremities without deformity or TTP. No cyanosis or clubbing. Skin: warm, dry. No rashes or lesions. Back: No CVA tenderness Neuro: A&Ox4, CNs II-XII grossly intact. MAEs. Sensation grossly intact.  Psych: Normal  mood and affect.   ED Results / Procedures / Treatments   Labs (all labs ordered are listed, but only abnormal results are displayed) Labs Reviewed  COMPREHENSIVE METABOLIC PANEL WITH GFR - Abnormal; Notable for the following components:      Result Value   CO2 20 (*)    Glucose, Bld 107 (*)    BUN 27 (*)    Creatinine, Ser 2.02 (*)    GFR, Estimated 31 (*)    All other components within normal limits  CBC WITH DIFFERENTIAL/PLATELET - Abnormal; Notable for the following components:   WBC 16.3 (*)    RBC 4.18 (*)    Hemoglobin 12.0 (*)    HCT 38.5 (*)    Neutro Abs 11.8 (*)    Monocytes Absolute 1.4 (*)    All other components within normal limits  URINALYSIS, W/ REFLEX TO CULTURE (INFECTION SUSPECTED) - Abnormal; Notable for the following components:   Hgb urine dipstick SMALL (*)    Protein, ur 30 (*)    Leukocytes,Ua LARGE (*)    Bacteria, UA RARE (*)    All other components within normal limits  URINE CULTURE  LIPASE, BLOOD    EKG None  Radiology CT abd w contrast: 1. New moderate right-sided hydronephrosis and hydroureter with associated perinephric soft tissue stranding and delayed contrast excretion consistent with distal ureteral obstruction. Persistent irregular right-sided bladder wall thickening extending anteriorly from the ureterovesical junction, concerning for recurrent tumor. Recommend urology consultation/follow-up. No evidence of ureteral or other urinary tract calculus. 2. No evidence of metastatic disease. 3. Distal colonic diverticulosis without evidence of acute inflammation. 4.  Aortic Atherosclerosis (ICD10-I70.0).  Procedures Procedures    Medications Ordered in ED Medications  cefTRIAXone  (ROCEPHIN ) 1 g in sodium chloride  0.9 % 100 mL IVPB (0 g Intravenous Stopped 06/28/24 1412)  iohexol  (OMNIPAQUE ) 300 MG/ML solution 80 mL (80 mLs Intravenous Contrast Given 06/28/24 1403)  morphine  (PF) 2 MG/ML injection 2 mg (2 mg Intravenous Given  06/28/24 1547)  acetaminophen  (TYLENOL ) tablet 1,000 mg (1,000 mg Oral Given 06/28/24 1548)    ED Course/ Medical Decision Making/ A&P                          Medical Decision Making Amount and/or Complexity of Data Reviewed Labs: ordered. Decision-making details documented in ED Course. Radiology: ordered. Decision-making details documented in ED Course.  Risk OTC drugs. Prescription drug management.    This patient presents to the ED for concern of R sided abdominal pain, this involves an extensive number of treatment options, and is a complaint that carries with it a high risk of complications and morbidity.  I considered the following differential and admission for this acute, potentially life threatening condition. Pt overall well-appearing but uncomfortable. Given tylenol /IV morphine  for analgesia.   MDM:    For DDX for abdominal pain includes but is not  limited to:  Abdominal exam without peritoneal signs. No evidence of acute abdomen at this time. Low suspicion for acute hepatobiliary disease (including acute cholecystitis or cholangitis), acute pancreatitis (neg lipase), PUD (including gastric perforation), vascular catastrophe, bowel obstruction, viscus perforation, or diverticulitis.  Consider UTI, appendicitis, ureterolithaisis, pyelonephritis. No evidence of testicular torsion.  No evidence of hernia.   Clinical Course as of 07/03/24 1256  Wed Jun 28, 2024  1249 Urinalysis, w/ Reflex to Culture (Infection Suspected) -Urine, Clean Catch(!) +?UTI [HN]  1249 WBC(!): 16.3 +Leukocytosis [HN]  1319 Will treat with IV ceftriaxone , no prior micro data for guidance. Culture in process. [HN]  1453 CT ABDOMEN PELVIS W CONTRAST 1. New moderate right-sided hydronephrosis and hydroureter with associated perinephric soft tissue stranding and delayed contrast excretion consistent with distal ureteral obstruction. Persistent irregular right-sided bladder wall thickening extending  anteriorly from the ureterovesical junction, concerning for recurrent tumor. Recommend urology consultation/follow-up. No evidence of ureteral or other urinary tract calculus. 2. No evidence of metastatic disease. 3. Distal colonic diverticulosis without evidence of acute inflammation. 4.  Aortic Atherosclerosis (ICD10-I70.0).   [HN]  1513 Discussing with Dr. Renda w/ urology. Recommend pain control and Dr. Renda will come to see him and d/w Dr. Elisabeth about a plan for him.  [HN]    Clinical Course User Index [HN] Franklyn Sid SAILOR, MD    Labs: I Ordered, and personally interpreted labs.  The pertinent results include: Those listed above  Imaging Studies ordered: I ordered imaging studies including CT abdomen pelvis I independently visualized and interpreted imaging. I agree with the radiologist interpretation  Additional history obtained from chart review.    Reevaluation: After the interventions noted above, I reevaluated the patient and found that they have :improved  Social Determinants of Health: Lives independently  Disposition:  Patient is signed out to the oncoming ED physician who is made aware of her history, presentation, exam, workup, and plan. Plan is for consult to urology.   Co morbidities that complicate the patient evaluation  Past Medical History:  Diagnosis Date   Anemia    Colon cancer (HCC)    Left    Generalized headaches    GERD (gastroesophageal reflux disease)    Hypertension    Stroke (HCC)    Visual disturbances      Medicines No orders of the defined types were placed in this encounter.   I have reviewed the patients home medicines and have made adjustments as needed  Problem List / ED Course: Problem List Items Addressed This Visit       Other   Abdominal pain- LLQ - Primary   Other Visit Diagnoses       Hydronephrosis, unspecified hydronephrosis type         Bladder tumor                       This note was  created using dictation software, which may contain spelling or grammatical errors.    Franklyn Sid SAILOR, MD 07/03/24 1300  "

## 2024-06-28 NOTE — ED Notes (Signed)
 First poc with patient. Pt axox4. GCS 15.  Pt denies pain at this time. VS obtained. Pt updated on plan of care, on cellular device.  Pt slow to respond, but baseline d/t CVA hx.  No respiratory distress noted.

## 2024-06-28 NOTE — ED Provider Notes (Signed)
 4:58 PM Patient signed out to me by previous ED physician. Pt is a 89 yo male with pmh of bladder and colon cancer presenting for hydroureter and hydronephrosis with hx of transurethral resection of bladder with gemcitabine  instillation on 05/30/2024 with Dr. Elisabeth presenting for abdominal pain.   CT imaging revealed right sided hydroureteronephrosis down to the level of the ureterovesical junction.  Cr 2.02 and GFR 31, recent pre-procedure cr 1.95 and GFR of 32.   Current leukocytosis of 16.3.  Urine positive for greater than 50 white blood cells, large leukocytes, negative for nitrites.  Rare bacteria.  P: Pending urology recs   Physical Exam  BP (!) 150/72 (BP Location: Left Arm)   Pulse 90   Temp 98.8 F (37.1 C) (Oral)   Resp 18   SpO2 99%   Physical Exam  Procedures  Procedures  ED Course / MDM   Clinical Course as of 06/28/24 1658  Wed Jun 28, 2024  1249 Urinalysis, w/ Reflex to Culture (Infection Suspected) -Urine, Clean Catch(!) +?UTI [HN]  1249 WBC(!): 16.3 +Leukocytosis [HN]  1319 Will treat with IV ceftriaxone , no prior micro data for guidance. Culture in process. [HN]  1453 CT ABDOMEN PELVIS W CONTRAST 1. New moderate right-sided hydronephrosis and hydroureter with associated perinephric soft tissue stranding and delayed contrast excretion consistent with distal ureteral obstruction. Persistent irregular right-sided bladder wall thickening extending anteriorly from the ureterovesical junction, concerning for recurrent tumor. Recommend urology consultation/follow-up. No evidence of ureteral or other urinary tract calculus. 2. No evidence of metastatic disease. 3. Distal colonic diverticulosis without evidence of acute inflammation. 4.  Aortic Atherosclerosis (ICD10-I70.0).   [HN]  1513 Discussing with Dr. Renda w/ urology. Recommend pain control and Dr. Renda will come to see him and d/w Dr. Elisabeth about a plan for him.  [HN]    Clinical Course User  Index [HN] Franklyn Sid SAILOR, MD   Medical Decision Making Amount and/or Complexity of Data Reviewed Labs: ordered. Decision-making details documented in ED Course. Radiology: ordered. Decision-making details documented in ED Course.  Risk OTC drugs. Prescription drug management. Decision regarding hospitalization.   Patient elevated by Dr. Chesley who is discussed with patient's primary urologist Dr. Elisabeth.  Recs for elective outpatient cystoscopy with possible repeat TURBT attempted right urethral stent placement.  Patient agreeable to plan.  Patient's is controlled at this time.  I spoke with his wife to discuss the plan over the phone.  Ready for discharge.  Patient in no distress and overall condition improved here in the ED. Detailed discussions were had with the patient regarding current findings, and need for close f/u with PCP or on call doctor. The patient has been instructed to return immediately if the symptoms worsen in any way for re-evaluation. Patient verbalized understanding and is in agreement with current care plan. All questions answered prior to discharge.        Elnor Bernarda SQUIBB, DO 06/28/24 1936

## 2024-06-28 NOTE — ED Triage Notes (Addendum)
 Patient BIBA coming from home low abdominal pain, hx of UTI/bladder cancer/prior stroke with right sided deficits. Patient is alert and oriented x 4. Airway patent, respirations even and unlabored. Skin normal, warm and dry. 152/84 98% RA HR 85.

## 2024-06-28 NOTE — Consult Note (Signed)
 Urology Consult   Physician requesting consult: Dr. Sid Boning  Reason for consult: Right hydroureteronephrosis  History of Present Illness: William Patel is a 89 y.o. gentleman who has a history of bladder cancer and colon cancer.  He underwent a transurethral resection of a very large right sided bladder tumor by Dr. Elisabeth on 05/30/2024.  He was seen back in the office on 06/08/2023.  Pathology indicated a large volume, high-grade, Ta urothelial carcinoma without evidence of lamina propria or muscle invasion.  Based on his advanced age, her initial plan was to have him follow-up with a renal ultrasound and repeat office cystoscopy.  However, he presented to the emergency department today with complaints of some right sided back pain, abdominal pain, and radiation to his right testicle.  He is a relatively poor historian but denies any fever, nausea or vomiting.  His serum creatinine is 2.0 and consistent with his baseline when it was 1.95 in early December.  CT imaging revealed right sided hydroureteronephrosis down to the level of the ureterovesical junction with a delayed nephrogram.   Past Medical History:  Diagnosis Date   Anemia    Colon cancer (HCC)    Left    Generalized headaches    GERD (gastroesophageal reflux disease)    Hypertension    Stroke [facility])    Visual disturbances     Past Surgical History:  Procedure Laterality Date   BACK SURGERY     BIOPSY  02/16/2023   Procedure: BIOPSY;  Surgeon: Saintclair Jasper, MD;  Location: [facility] ENDOSCOPY;  Service: Gastroenterology;;   CATARACT EXTRACTION W/ INTRAOCULAR LENS IMPLANT Bilateral    COLON RESECTION  09/21/2011   Procedure: COLON RESECTION LAPAROSCOPIC;  Surgeon: Donnice KATHEE Lunger, MD;  Location: [facility] ORS;  Service: General;  Laterality: N/A;      COLONOSCOPY  07/03/2011   endo done also   ESOPHAGOGASTRODUODENOSCOPY (EGD) WITH PROPOFOL  N/A 07/25/2022   Procedure: ESOPHAGOGASTRODUODENOSCOPY (EGD) WITH PROPOFOL ;  Surgeon: Saintclair Jasper,  MD;  Location: [facility] ENDOSCOPY;  Service: Gastroenterology;  Laterality: N/A;   ESOPHAGOGASTRODUODENOSCOPY (EGD) WITH PROPOFOL  N/A 02/16/2023   Procedure: ESOPHAGOGASTRODUODENOSCOPY (EGD) WITH PROPOFOL ;  Surgeon: Saintclair Jasper, MD;  Location: [facility] ENDOSCOPY;  Service: Gastroenterology;  Laterality: N/A;    Medications:  Home meds: Medications Ordered Prior to Encounter[1]   Scheduled Meds: Continuous Infusions: PRN Meds:.  Allergies: Allergies[2]  Family History  Problem Relation Age of Onset   Heart failure Mother    Brain cancer Father    Stroke Brother     Social History:  reports that he has quit smoking. His smoking use included cigars. His smokeless tobacco use includes chew. He reports current alcohol use. He reports that he does not use drugs.  ROS: A complete review of systems was performed.  All systems are negative except for pertinent findings as noted.  Physical Exam:  Vital signs in last 24 hours: Temp:  [98.8 F (37.1 C)-98.9 F (37.2 C)] 98.8 F (37.1 C) (01/28 1414) Pulse Rate:  [90] 90 (01/28 1414) Resp:  [18] 18 (01/28 1414) BP: (150-154)/(72-90) 150/72 (01/28 1414) SpO2:  [99 %] 99 % (01/28 1414) Constitutional:  Alert and oriented, No acute distress Cardiovascular: No JVD Respiratory: Normal respiratory effort GI: Soft, nondistended, mild tenderness in both the right and left CVA.   Genitourinary: No CVAT. Normal male phallus, testes are descended bilaterally, atrophic, and non-tender and without masses, scrotum is normal in appearance without lesions or masses, perineum is normal on inspection. Lymphatic: No lymphadenopathy Neurologic:  Grossly intact, no focal deficits Psychiatric: Normal mood and affect  Laboratory Data:  Recent Labs    06/28/24 1013  WBC 16.3*  HGB 12.0*  HCT 38.5*  PLT 241    Recent Labs    06/28/24 1013  NA 140  K 4.0  CL 106  GLUCOSE 107*  BUN 27*  CALCIUM  9.7  CREATININE 2.02*     Results for orders placed or  performed during the hospital encounter of 06/28/24 (from the past 24 hours)  Comprehensive metabolic panel     Status: Abnormal   Collection Time: 06/28/24 10:13 AM  Result Value Ref Range   Sodium 140 135 - 145 mmol/L   Potassium 4.0 3.5 - 5.1 mmol/L   Chloride 106 98 - 111 mmol/L   CO2 20 (L) 22 - 32 mmol/L   Glucose, Bld 107 (H) 70 - 99 mg/dL   BUN 27 (H) 8 - 23 mg/dL   Creatinine, Ser 7.97 (H) 0.61 - 1.24 mg/dL   Calcium  9.7 8.9 - 10.3 mg/dL   Total Protein 7.5 6.5 - 8.1 g/dL   Albumin 4.6 3.5 - 5.0 g/dL   AST 16 15 - 41 U/L   ALT 8 0 - 44 U/L   Alkaline Phosphatase 92 38 - 126 U/L   Total Bilirubin 0.5 0.0 - 1.2 mg/dL   GFR, Estimated 31 (L) >60 mL/min   Anion gap 14 5 - 15  Lipase, blood     Status: None   Collection Time: 06/28/24 10:13 AM  Result Value Ref Range   Lipase 35 11 - 51 U/L  CBC with Diff     Status: Abnormal   Collection Time: 06/28/24 10:13 AM  Result Value Ref Range   WBC 16.3 (H) 4.0 - 10.5 K/uL   RBC 4.18 (L) 4.22 - 5.81 MIL/uL   Hemoglobin 12.0 (L) 13.0 - 17.0 g/dL   HCT 61.4 (L) 60.9 - 47.9 %   MCV 92.1 80.0 - 100.0 fL   MCH 28.7 26.0 - 34.0 pg   MCHC 31.2 30.0 - 36.0 g/dL   RDW 87.1 88.4 - 84.4 %   Platelets 241 150 - 400 K/uL   nRBC 0.0 0.0 - 0.2 %   Neutrophils Relative % 73 %   Neutro Abs 11.8 (H) 1.7 - 7.7 K/uL   Lymphocytes Relative 18 %   Lymphs Abs 3.0 0.7 - 4.0 K/uL   Monocytes Relative 9 %   Monocytes Absolute 1.4 (H) 0.1 - 1.0 K/uL   Eosinophils Relative 0 %   Eosinophils Absolute 0.0 0.0 - 0.5 K/uL   Basophils Relative 0 %   Basophils Absolute 0.0 0.0 - 0.1 K/uL   Immature Granulocytes 0 %   Abs Immature Granulocytes 0.07 0.00 - 0.07 K/uL  Urinalysis, w/ Reflex to Culture (Infection Suspected) -Urine, Clean Catch     Status: Abnormal   Collection Time: 06/28/24 12:07 PM  Result Value Ref Range   Specimen Source URINE, CLEAN CATCH    Color, Urine YELLOW YELLOW   APPearance CLEAR CLEAR   Specific Gravity, Urine 1.014 1.005 -  1.030   pH 5.0 5.0 - 8.0   Glucose, UA NEGATIVE NEGATIVE mg/dL   Hgb urine dipstick SMALL (A) NEGATIVE   Bilirubin Urine NEGATIVE NEGATIVE   Ketones, ur NEGATIVE NEGATIVE mg/dL   Protein, ur 30 (A) NEGATIVE mg/dL   Nitrite NEGATIVE NEGATIVE   Leukocytes,Ua LARGE (A) NEGATIVE   RBC / HPF 6-10 0 - 5 RBC/hpf   WBC, UA >  50 0 - 5 WBC/hpf   Bacteria, UA RARE (A) NONE SEEN   Squamous Epithelial / HPF 0-5 0 - 5 /HPF   Mucus PRESENT    No results found for this or any previous visit (from the past 240 hours).  Renal Function: Recent Labs    06/28/24 1013  CREATININE 2.02*   CrCl cannot be calculated (Unknown ideal weight.).  Radiologic Imaging: CT ABDOMEN PELVIS W CONTRAST Result Date: 06/28/2024 CLINICAL DATA:  Abdominal pain, acute, nonlocalized. History of UTI and bladder cancer. * Tracking Code: BO * EXAM: CT ABDOMEN AND PELVIS WITH CONTRAST TECHNIQUE: Multidetector CT imaging of the abdomen and pelvis was performed using the standard protocol following bolus administration of intravenous contrast. RADIATION DOSE REDUCTION: This exam was performed according to the departmental dose-optimization program which includes automated exposure control, adjustment of the mA and/or kV according to patient size and/or use of iterative reconstruction technique. CONTRAST:  80mL OMNIPAQUE  IOHEXOL  300 MG/ML  SOLN COMPARISON:  Abdominopelvic CT 04/11/2024.  CTA 02/15/2023. FINDINGS: Lower chest: Similar chronic scarring at both lung bases. No confluent airspace disease, suspicious nodularity or pleural effusion. There are aortic and coronary artery calcifications. Hepatobiliary: The liver has a non cirrhotic morphology without suspicious focal abnormality. Scattered hepatic cysts are unchanged. No evidence of gallstones, gallbladder wall thickening or biliary dilatation. Pancreas: Unremarkable. No pancreatic ductal dilatation or surrounding inflammatory changes. Spleen: Normal in size without focal  abnormality. Adrenals/Urinary Tract: Both adrenal glands appear normal. New moderate asymmetric right-sided hydronephrosis with associated perinephric soft tissue stranding and delayed contrast excretion. The right ureter is dilated to the ureterovesical junction. There is persistent irregular right-sided bladder wall thickening extending anteriorly from the ureterovesical junction, concerning for recurrent tumor. No evidence of ureteral or other urinary tract calculus. Mild asymmetric right renal cortical thinning without focal mass lesion. The left renal collecting system is partially duplicated without evidence of obstruction. Stomach/Bowel: No enteric contrast administered. The stomach appears unremarkable for its degree of distention. No evidence of bowel distension, wall thickening or surrounding inflammation. Proximal duodenal diverticula are again noted. The appendix appears normal. Mildly prominent stool throughout the colon. There are diverticular changes throughout the descending and sigmoid colon. Vascular/Lymphatic: There are no enlarged abdominal or pelvic lymph nodes. Aortic and branch vessel atherosclerosis without evidence of aneurysm or large vessel occlusion. Reproductive: The prostate gland appears stable. Other: Stable small left inguinal hernia containing fat. Stable small umbilical hernia containing fat. No ascites or pneumoperitoneum. Musculoskeletal: No acute or significant osseous findings. Stable chronic T11 compression deformity and multilevel spondylosis. IMPRESSION: 1. New moderate right-sided hydronephrosis and hydroureter with associated perinephric soft tissue stranding and delayed contrast excretion consistent with distal ureteral obstruction. Persistent irregular right-sided bladder wall thickening extending anteriorly from the ureterovesical junction, concerning for recurrent tumor. Recommend urology consultation/follow-up. No evidence of ureteral or other urinary tract calculus.  2. No evidence of metastatic disease. 3. Distal colonic diverticulosis without evidence of acute inflammation. 4.  Aortic Atherosclerosis (ICD10-I70.0). Electronically Signed   By: Elsie Perone M.D.   On: 06/28/2024 14:40    I independently reviewed the above imaging studies.  Impression/Recommendation 1.  Bladder cancer/right hydroureteronephrosis: He would appear to have evidence of distal right ureteral obstruction which may be related to his recent transurethral resection of his bladder tumor.  At this point, he does not appear to have evidence of systemic infection and his urinalysis is consistent with postprocedural findings rather than true infection.  His renal function remained stable.  Assuming his  pain can be controlled, I will discuss his case with Dr. Elisabeth.  It would likely be most appropriate for him to proceed with an elective, outpatient cystoscopy, with possible repeat TURBT, and attempted right ureteral stent placement.  If retrograde ureteral stent placement is unsuccessful, he may require a right sided nephrostomy tube.  Noretta Ferrara 06/28/2024, 4:28 PM    Gretel CANDIE Ferrara Teddie MD  CC: Dr. Sid Boning      [1]  No current facility-administered medications on file prior to encounter.   Current Outpatient Medications on File Prior to Encounter  Medication Sig Dispense Refill   pantoprazole  (PROTONIX ) 40 MG tablet Take 40 mg by mouth daily before breakfast.     acetaminophen  (TYLENOL ) 325 MG tablet Take 650 mg by mouth every 4 (four) hours as needed for mild pain, fever or headache.     atorvastatin  (LIPITOR) 40 MG tablet Take 1 tablet (40 mg total) by mouth daily. 90 tablet 4   B Complex-C (B-COMPLEX WITH VITAMIN C) tablet Take 1 tablet by mouth daily. 30 tablet 0   cephALEXin  (KEFLEX ) 500 MG capsule Take 1 capsule (500 mg total) by mouth daily. 5 capsule 0   [Paused] clopidogrel  (PLAVIX ) 75 MG tablet Take 1 tablet (75 mg total) by mouth daily. 90 tablet 3   ferrous  sulfate 325 (65 FE) MG tablet Take 1 tablet (325 mg total) by mouth daily with breakfast. 90 tablet 0   gabapentin (NEURONTIN) 100 MG capsule Take 100-300 mg by mouth at bedtime.     meclizine  (ANTIVERT ) 12.5 MG tablet Take 12.5 mg by mouth 3 (three) times daily as needed (vertigo).     pantoprazole  (PROTONIX ) 40 MG tablet Take 1 tablet (40 mg total) by mouth TWICE daily for 1 month until this prescription ends 10/17 ... Then take ONCE daily indefinitely (Patient not taking: Reported on 06/28/2024) 60 tablet 0   sucralfate  (CARAFATE ) 1 GM/10ML suspension Take 10 mLs (1 g total) by mouth 4 (four) times daily -  with meals and at bedtime. (Patient not taking: Reported on 05/18/2024) 420 mL 0  [2] No Known Allergies

## 2024-06-30 ENCOUNTER — Other Ambulatory Visit: Payer: Self-pay | Admitting: Urology

## 2024-06-30 LAB — URINE CULTURE: Culture: NO GROWTH

## 2024-07-01 ENCOUNTER — Other Ambulatory Visit: Payer: Self-pay | Admitting: Urology

## 2024-07-03 ENCOUNTER — Inpatient Hospital Stay (HOSPITAL_COMMUNITY)
Admission: EM | Admit: 2024-07-03 | Discharge: 2024-07-05 | DRG: 687 | Disposition: A | Attending: Family Medicine | Admitting: Family Medicine

## 2024-07-03 ENCOUNTER — Encounter (HOSPITAL_COMMUNITY): Payer: Self-pay

## 2024-07-03 ENCOUNTER — Other Ambulatory Visit: Payer: Self-pay

## 2024-07-03 DIAGNOSIS — N139 Obstructive and reflux uropathy, unspecified: Secondary | ICD-10-CM | POA: Diagnosis not present

## 2024-07-03 DIAGNOSIS — Z85038 Personal history of other malignant neoplasm of large intestine: Secondary | ICD-10-CM

## 2024-07-03 DIAGNOSIS — E785 Hyperlipidemia, unspecified: Secondary | ICD-10-CM | POA: Diagnosis present

## 2024-07-03 DIAGNOSIS — Z8711 Personal history of peptic ulcer disease: Secondary | ICD-10-CM

## 2024-07-03 DIAGNOSIS — G629 Polyneuropathy, unspecified: Secondary | ICD-10-CM | POA: Diagnosis present

## 2024-07-03 DIAGNOSIS — I69351 Hemiplegia and hemiparesis following cerebral infarction affecting right dominant side: Secondary | ICD-10-CM

## 2024-07-03 DIAGNOSIS — Z808 Family history of malignant neoplasm of other organs or systems: Secondary | ICD-10-CM

## 2024-07-03 DIAGNOSIS — K279 Peptic ulcer, site unspecified, unspecified as acute or chronic, without hemorrhage or perforation: Secondary | ICD-10-CM | POA: Diagnosis present

## 2024-07-03 DIAGNOSIS — Z9842 Cataract extraction status, left eye: Secondary | ICD-10-CM

## 2024-07-03 DIAGNOSIS — Z961 Presence of intraocular lens: Secondary | ICD-10-CM | POA: Diagnosis present

## 2024-07-03 DIAGNOSIS — D649 Anemia, unspecified: Secondary | ICD-10-CM

## 2024-07-03 DIAGNOSIS — Z8249 Family history of ischemic heart disease and other diseases of the circulatory system: Secondary | ICD-10-CM

## 2024-07-03 DIAGNOSIS — G8929 Other chronic pain: Secondary | ICD-10-CM | POA: Diagnosis present

## 2024-07-03 DIAGNOSIS — Z9841 Cataract extraction status, right eye: Secondary | ICD-10-CM

## 2024-07-03 DIAGNOSIS — N1831 Chronic kidney disease, stage 3a: Secondary | ICD-10-CM | POA: Diagnosis present

## 2024-07-03 DIAGNOSIS — D638 Anemia in other chronic diseases classified elsewhere: Secondary | ICD-10-CM | POA: Diagnosis present

## 2024-07-03 DIAGNOSIS — R310 Gross hematuria: Secondary | ICD-10-CM | POA: Diagnosis present

## 2024-07-03 DIAGNOSIS — I69392 Facial weakness following cerebral infarction: Secondary | ICD-10-CM

## 2024-07-03 DIAGNOSIS — I130 Hypertensive heart and chronic kidney disease with heart failure and stage 1 through stage 4 chronic kidney disease, or unspecified chronic kidney disease: Secondary | ICD-10-CM | POA: Diagnosis present

## 2024-07-03 DIAGNOSIS — N179 Acute kidney failure, unspecified: Secondary | ICD-10-CM | POA: Diagnosis present

## 2024-07-03 DIAGNOSIS — Z7902 Long term (current) use of antithrombotics/antiplatelets: Secondary | ICD-10-CM

## 2024-07-03 DIAGNOSIS — Z823 Family history of stroke: Secondary | ICD-10-CM

## 2024-07-03 DIAGNOSIS — I5030 Unspecified diastolic (congestive) heart failure: Secondary | ICD-10-CM | POA: Diagnosis present

## 2024-07-03 DIAGNOSIS — Z66 Do not resuscitate: Secondary | ICD-10-CM | POA: Diagnosis present

## 2024-07-03 DIAGNOSIS — N131 Hydronephrosis with ureteral stricture, not elsewhere classified: Secondary | ICD-10-CM | POA: Diagnosis present

## 2024-07-03 DIAGNOSIS — Z9049 Acquired absence of other specified parts of digestive tract: Secondary | ICD-10-CM

## 2024-07-03 DIAGNOSIS — C679 Malignant neoplasm of bladder, unspecified: Principal | ICD-10-CM | POA: Diagnosis present

## 2024-07-03 DIAGNOSIS — D494 Neoplasm of unspecified behavior of bladder: Secondary | ICD-10-CM

## 2024-07-03 LAB — CBC WITH DIFFERENTIAL/PLATELET
Abs Immature Granulocytes: 0.05 10*3/uL (ref 0.00–0.07)
Basophils Absolute: 0 10*3/uL (ref 0.0–0.1)
Basophils Relative: 0 %
Eosinophils Absolute: 0.1 10*3/uL (ref 0.0–0.5)
Eosinophils Relative: 1 %
HCT: 36.4 % — ABNORMAL LOW (ref 39.0–52.0)
Hemoglobin: 11.3 g/dL — ABNORMAL LOW (ref 13.0–17.0)
Immature Granulocytes: 1 %
Lymphocytes Relative: 18 %
Lymphs Abs: 1.9 10*3/uL (ref 0.7–4.0)
MCH: 28.3 pg (ref 26.0–34.0)
MCHC: 31 g/dL (ref 30.0–36.0)
MCV: 91 fL (ref 80.0–100.0)
Monocytes Absolute: 0.9 10*3/uL (ref 0.1–1.0)
Monocytes Relative: 8 %
Neutro Abs: 7.7 10*3/uL (ref 1.7–7.7)
Neutrophils Relative %: 72 %
Platelets: 275 10*3/uL (ref 150–400)
RBC: 4 MIL/uL — ABNORMAL LOW (ref 4.22–5.81)
RDW: 12.8 % (ref 11.5–15.5)
WBC: 10.6 10*3/uL — ABNORMAL HIGH (ref 4.0–10.5)
nRBC: 0 % (ref 0.0–0.2)

## 2024-07-03 LAB — COMPREHENSIVE METABOLIC PANEL WITH GFR
ALT: 12 U/L (ref 0–44)
AST: 19 U/L (ref 15–41)
Albumin: 4.1 g/dL (ref 3.5–5.0)
Alkaline Phosphatase: 87 U/L (ref 38–126)
Anion gap: 14 (ref 5–15)
BUN: 29 mg/dL — ABNORMAL HIGH (ref 8–23)
CO2: 21 mmol/L — ABNORMAL LOW (ref 22–32)
Calcium: 9.7 mg/dL (ref 8.9–10.3)
Chloride: 104 mmol/L (ref 98–111)
Creatinine, Ser: 2.2 mg/dL — ABNORMAL HIGH (ref 0.61–1.24)
GFR, Estimated: 28 mL/min — ABNORMAL LOW
Glucose, Bld: 111 mg/dL — ABNORMAL HIGH (ref 70–99)
Potassium: 4.1 mmol/L (ref 3.5–5.1)
Sodium: 139 mmol/L (ref 135–145)
Total Bilirubin: 0.3 mg/dL (ref 0.0–1.2)
Total Protein: 7.3 g/dL (ref 6.5–8.1)

## 2024-07-03 LAB — URINALYSIS, ROUTINE W REFLEX MICROSCOPIC
Bilirubin Urine: NEGATIVE
Glucose, UA: NEGATIVE mg/dL
Ketones, ur: NEGATIVE mg/dL
Nitrite: NEGATIVE
Protein, ur: NEGATIVE mg/dL
Specific Gravity, Urine: 1.01 (ref 1.005–1.030)
pH: 5 (ref 5.0–8.0)

## 2024-07-03 LAB — TYPE AND SCREEN
ABO/RH(D): O POS
Antibody Screen: NEGATIVE

## 2024-07-03 LAB — LIPASE, BLOOD: Lipase: 32 U/L (ref 11–51)

## 2024-07-03 MED ORDER — POLYETHYLENE GLYCOL 3350 17 G PO PACK: 17.0000 g | PACK | Freq: Every day | ORAL | Status: DC | PRN

## 2024-07-03 MED ORDER — SODIUM CHLORIDE 0.9 % IV BOLUS
1000.0000 mL | Freq: Once | INTRAVENOUS | Status: AC
  Administered 2024-07-03: 1000 mL via INTRAVENOUS

## 2024-07-03 MED ORDER — MORPHINE SULFATE (PF) 4 MG/ML IV SOLN
4.0000 mg | Freq: Once | INTRAVENOUS | Status: AC
  Administered 2024-07-03: 4 mg via INTRAVENOUS
  Filled 2024-07-03: qty 1

## 2024-07-03 MED ORDER — LACTATED RINGERS IV SOLN: INTRAVENOUS | Status: AC

## 2024-07-03 MED ORDER — ACETAMINOPHEN 650 MG RE SUPP: 650.0000 mg | Freq: Four times a day (QID) | RECTAL | Status: DC | PRN

## 2024-07-03 MED ORDER — PANTOPRAZOLE SODIUM 40 MG PO TBEC
40.0000 mg | DELAYED_RELEASE_TABLET | Freq: Every day | ORAL | Status: DC
  Administered 2024-07-03 – 2024-07-04 (×2): 40 mg via ORAL
  Filled 2024-07-03 (×2): qty 1

## 2024-07-03 MED ORDER — ACETAMINOPHEN 325 MG PO TABS: 650.0000 mg | ORAL_TABLET | Freq: Four times a day (QID) | ORAL | Status: DC | PRN

## 2024-07-03 MED ORDER — ATORVASTATIN CALCIUM 40 MG PO TABS
40.0000 mg | ORAL_TABLET | Freq: Every day | ORAL | Status: DC
  Administered 2024-07-03: 40 mg via ORAL
  Filled 2024-07-03: qty 1

## 2024-07-03 MED ORDER — OXYCODONE HCL 5 MG PO TABS: 2.5000 mg | ORAL_TABLET | Freq: Four times a day (QID) | ORAL | Status: DC | PRN

## 2024-07-03 MED ORDER — ONDANSETRON HCL 4 MG/2ML IJ SOLN
4.0000 mg | Freq: Once | INTRAMUSCULAR | Status: AC
  Administered 2024-07-03: 4 mg via INTRAVENOUS
  Filled 2024-07-03: qty 2

## 2024-07-03 MED ORDER — GABAPENTIN 100 MG PO CAPS: 100.0000 mg | ORAL_CAPSULE | Freq: Three times a day (TID) | ORAL | Status: DC | PRN

## 2024-07-03 NOTE — H&P (Signed)
 " History and Physical    William Patel FMW:[id number] DOB: [birth year] DOA: 07/03/2024  PCP: Loring Tanda Mae, MD  Patient coming from: Home  Chief Complaint: Abdominal pain  HPI: William Patel is a 89 y.o. male with medical history significant of bladder and colon cancer, history of transurethral resection of bladder with gemcitabine  instillation on 05/30/2024, anemia, hypertension, hyperlipidemia, CVA with residual right-sided facial droop/slurred speech/right upper extremity weakness, PUD, CKD stage IIIa presenting with a chief complaint of right lower abdominal pain.  He recently had a CT abdomen pelvis done during ED visit 5 days ago which showed new moderate right-sided hydronephrosis and hydroureter with associated perinephric soft tissue stranding and delayed contrast excretion consistent with distal ureteral obstruction.  CT also showed persistent irregular right sided bladder wall thickening extending anteriorly from the ureterovesical junction concerning for recurrent tumor.  There was no evidence of ureteral or other urinary tract calculus.  In addition, there was no evidence of metastatic disease.  Case was discussed with urology at that time and they had recommended elective outpatient cystoscopy with possible repeat TURBT and right ureteral stent placement.  Patient is reporting worsening right lower quadrant abdominal pain and sometimes experiences difficulty emptying his bladder.  Not endorsing any urinary symptoms at this time.  Denies fevers, nausea, or vomiting.  Denies chest pain or shortness of breath.  No other complaints.  ED Course: Vital signs stable.  Labs notable for WBC count 10.6 (improved since labs done 5 days ago), hemoglobin 11.3 (close to baseline), bicarb 21, glucose 111, creatinine 2.2 (was 2.0 on labs 5 days ago and previously 1.4-1.7 in 2024), normal lipase and LFTs.  UA with negative nitrite, moderate leukocytes, and microscopy showing 6-10 WBCs and rare  bacteria.  Patient was given morphine , Zofran , and 1 L normal saline.  ED physician discussed the case with urologist Dr. Watt and he recommends consulting IR in the morning for right-sided percutaneous nephrostomy.  Review of Systems:  Review of Systems  All other systems reviewed and are negative.   Past Medical History:  Diagnosis Date   Anemia    Colon cancer (HCC)    Left    Generalized headaches    GERD (gastroesophageal reflux disease)    Hypertension    Stroke [facility])    Visual disturbances     Past Surgical History:  Procedure Laterality Date   BACK SURGERY     BIOPSY  02/16/2023   Procedure: BIOPSY;  Surgeon: Saintclair Jasper, MD;  Location: [facility] ENDOSCOPY;  Service: Gastroenterology;;   CATARACT EXTRACTION W/ INTRAOCULAR LENS IMPLANT Bilateral    COLON RESECTION  09/21/2011   Procedure: COLON RESECTION LAPAROSCOPIC;  Surgeon: Donnice KATHEE Lunger, MD;  Location: [facility] ORS;  Service: General;  Laterality: N/A;      COLONOSCOPY  07/03/2011   endo done also   ESOPHAGOGASTRODUODENOSCOPY (EGD) WITH PROPOFOL  N/A 07/25/2022   Procedure: ESOPHAGOGASTRODUODENOSCOPY (EGD) WITH PROPOFOL ;  Surgeon: Saintclair Jasper, MD;  Location: [facility] ENDOSCOPY;  Service: Gastroenterology;  Laterality: N/A;   ESOPHAGOGASTRODUODENOSCOPY (EGD) WITH PROPOFOL  N/A 02/16/2023   Procedure: ESOPHAGOGASTRODUODENOSCOPY (EGD) WITH PROPOFOL ;  Surgeon: Saintclair Jasper, MD;  Location: [facility] ENDOSCOPY;  Service: Gastroenterology;  Laterality: N/A;     reports that he has quit smoking. His smoking use included cigars. His smokeless tobacco use includes chew. He reports current alcohol use. He reports that he does not use drugs.  Allergies[1]  Family History  Problem Relation Age of Onset   Heart failure Mother    Brain  cancer Father    Stroke Brother     Prior to Admission medications  Medication Sig Start Date End Date Taking? Authorizing Provider  amoxicillin-clavulanate (AUGMENTIN) 500-125 MG tablet Take 1 tablet by mouth 2  (two) times daily.    [provider]  atorvastatin  (LIPITOR) 40 MG tablet Take 1 tablet (40 mg total) by mouth daily. Patient taking differently: Take 40 mg by mouth at bedtime. 12/21/17   Whitfield Raisin, NP  B Complex-C (B-COMPLEX WITH VITAMIN C) tablet Take 1 tablet by mouth daily. Patient not taking: Reported on 06/28/2024 07/25/22   Patel, Pranav M, MD  ciprofloxacin (CIPRO) 250 MG tablet Take 250 mg by mouth daily. 06/27/24   [provider]  [Paused] clopidogrel  (PLAVIX ) 75 MG tablet Take 1 tablet (75 mg total) by mouth daily. Patient taking differently: Take 75 mg by mouth at bedtime. Wait to take this until your doctor or other care provider tells you to start again. 07/31/22   Patel, Pranav M, MD  DRAMAMINE 25 MG tablet Take 25 mg by mouth 3 (three) times daily as needed for dizziness. Patient not taking: Reported on 07/03/2024    [provider]  ferrous sulfate  325 (65 FE) MG tablet Take 1 tablet (325 mg total) by mouth daily with breakfast. Patient not taking: Reported on 06/28/2024 07/25/22   Tobie Yetta HERO, MD  gabapentin  (NEURONTIN ) 100 MG capsule Take 100 mg by mouth 3 (three) times daily. 05/04/24   [provider]  nystatin (MYCOSTATIN) 100000 UNIT/ML suspension Take 5 mLs by mouth at bedtime as needed (for ongoing yeast issue - gargle and spit).    [provider]  nystatin cream (MYCOSTATIN) Apply 1 Application topically See admin instructions. Apply to the affected area of the mouth (right side) 2 times a day as needed    [provider]  pantoprazole  (PROTONIX ) 40 MG tablet Take 40 mg by mouth at bedtime.    [provider]  predniSONE (DELTASONE) 5 MG tablet Take 2.5-10 mg by mouth daily as needed (when sick- as directed by provider- TAKE WITH FOOD).    [provider]  sucralfate  (CARAFATE ) 1 GM/10ML suspension Take 10 mLs (1 g total) by mouth 4 (four) times daily -  with meals and at bedtime. Patient not  taking: Reported on 05/18/2024 02/16/23   Cornelius Dines, MD  TYLENOL  500 MG tablet Take 500-1,000 mg by mouth every 8 (eight) hours as needed for mild pain (pain score 1-3) (or headaches).    [provider]    Physical Exam: Vitals:   07/03/24 1525 07/03/24 1526 07/03/24 1820  BP: (!) 168/72  133/86  Pulse: 76  89  Resp: 18  18  Temp: 98.1 F (36.7 C)  98 F (36.7 C)  SpO2: 100%  98%  Weight:  63.9 kg   Height:  5' 2 (1.575 m)     Physical Exam Vitals reviewed.  Constitutional:      General: He is not in acute distress. HENT:     Head: Normocephalic and atraumatic.  Eyes:     Extraocular Movements: Extraocular movements intact.  Cardiovascular:     Rate and Rhythm: Normal rate and regular rhythm.     Heart sounds: Normal heart sounds.  Pulmonary:     Effort: Pulmonary effort is normal. No respiratory distress.     Breath sounds: Normal breath sounds.  Abdominal:     General: Bowel sounds are normal. There is no distension.     Palpations:  Abdomen is soft.     Tenderness: There is abdominal tenderness. There is no guarding or rebound.     Comments: Mild right lower quadrant tenderness  Musculoskeletal:     Cervical back: Normal range of motion.     Right lower leg: No edema.     Left lower leg: No edema.  Skin:    General: Skin is warm and dry.  Neurological:     Mental Status: He is alert and oriented to person, place, and time. Mental status is at baseline.     Labs on Admission: I have personally reviewed following labs and imaging studies  CBC: Recent Labs  Lab 06/28/24 1013 07/03/24 1623  WBC 16.3* 10.6*  NEUTROABS 11.8* 7.7  HGB 12.0* 11.3*  HCT 38.5* 36.4*  MCV 92.1 91.0  PLT 241 275   Basic Metabolic Panel: Recent Labs  Lab 06/28/24 1013 07/03/24 1623  NA 140 139  K 4.0 4.1  CL 106 104  CO2 20* 21*  GLUCOSE 107* 111*  BUN 27* 29*  CREATININE 2.02* 2.20*  CALCIUM  9.7 9.7   GFR: Estimated Creatinine Clearance: 17.9 mL/min  (A) (by C-G formula based on SCr of 2.2 mg/dL (H)). Liver Function Tests: Recent Labs  Lab 06/28/24 1013 07/03/24 1623  AST 16 19  ALT 8 12  ALKPHOS 92 87  BILITOT 0.5 0.3  PROT 7.5 7.3  ALBUMIN 4.6 4.1   Recent Labs  Lab 06/28/24 1013 07/03/24 1623  LIPASE 35 32   No results for input(s): AMMONIA in the last 168 hours. Coagulation Profile: No results for input(s): INR, PROTIME in the last 168 hours. Cardiac Enzymes: No results for input(s): CKTOTAL, CKMB, CKMBINDEX, TROPONINI in the last 168 hours. BNP (last 3 results) No results for input(s): PROBNP in the last 8760 hours. HbA1C: No results for input(s): HGBA1C in the last 72 hours. CBG: No results for input(s): GLUCAP in the last 168 hours. Lipid Profile: No results for input(s): CHOL, HDL, LDLCALC, TRIG, CHOLHDL, LDLDIRECT in the last 72 hours. Thyroid  Function Tests: No results for input(s): TSH, T4TOTAL, FREET4, T3FREE, THYROIDAB in the last 72 hours. Anemia Panel: No results for input(s): VITAMINB12, FOLATE, FERRITIN, TIBC, IRON, RETICCTPCT in the last 72 hours. Urine analysis:    Component Value Date/Time   COLORURINE STRAW (A) 07/03/2024 1736   APPEARANCEUR CLEAR 07/03/2024 1736   LABSPEC 1.010 07/03/2024 1736   PHURINE 5.0 07/03/2024 1736   GLUCOSEU NEGATIVE 07/03/2024 1736   HGBUR SMALL (A) 07/03/2024 1736   BILIRUBINUR NEGATIVE 07/03/2024 1736   KETONESUR NEGATIVE 07/03/2024 1736   PROTEINUR NEGATIVE 07/03/2024 1736   UROBILINOGEN 0.2 12/24/2013 1232   NITRITE NEGATIVE 07/03/2024 1736   LEUKOCYTESUR MODERATE (A) 07/03/2024 1736    Radiological Exams on Admission: No results found.  Assessment and Plan  Recurrent bladder tumor at the ureterovesical junction causing distal ureteral obstruction and new moderate right-sided hydronephrosis and hydroureter AKI on CKD stage IIIa Creatinine is trending up on labs.  Currently 2.2 (was 2.0 on labs 5  days ago and previously 1.4-1.7 in 2024).  UA without frank infection, WBC count improving on labs, and patient is afebrile.  Do not think empiric antibiotics are indicated at this time but need to be started immediately if patient develops fever or signs of sepsis.  Will send off urine culture.  Urology had initially recommended elective outpatient cystoscopy with possible repeat TURBT and right ureteral stent placement, however, given persistent pain and rising creatinine on labs, now recommending consulting  IR in the morning for right-sided percutaneous nephrostomy.  Keep n.p.o. after midnight except sips with meds, maintenance IV fluids.  Continue pain management.  Strict I&O's.  Monitor CBC and BMP.  Avoid nephrotoxic agents.  Hold home Plavix .  Chronic anemia Hemoglobin close to baseline.  Type and screen for possible procedural bleeding, monitor H&H.  Hypertension Not on antihypertensives at home.  Monitor blood pressure closely.  Hyperlipidemia Continue Lipitor.  History of CVA Hold Plavix  as above.  Continue Lipitor.  PUD Continue Protonix .  Peripheral neuropathy/chronic pain Continue home gabapentin .  Patient states he 100 mg 3 times a day PRN instead of scheduled.  DVT prophylaxis: SCDs Code Status: DNR/DNI (discussed with the patient) Level of care: Telemetry bed Admission status: It is my clinical opinion that admission to INPATIENT is reasonable and necessary because of the expectation that this patient will require hospital care that crosses at least 2 midnights to treat this condition based on the medical complexity of the problems presented.  Given the aforementioned information, the predictability of an adverse outcome is felt to be significant.  Editha Ram MD [facility]  If 7PM-7AM, please contact night-coverage [email or web address]  07/03/2024, 7:14 PM       [1] No Known Allergies  "

## 2024-07-04 ENCOUNTER — Inpatient Hospital Stay (HOSPITAL_COMMUNITY)

## 2024-07-04 LAB — PROTIME-INR
INR: 1 (ref 0.8–1.2)
Prothrombin Time: 14.2 s (ref 11.4–15.2)

## 2024-07-04 LAB — GLUCOSE, CAPILLARY: Glucose-Capillary: 102 mg/dL — ABNORMAL HIGH (ref 70–99)

## 2024-07-04 LAB — BASIC METABOLIC PANEL WITH GFR
Anion gap: 10 (ref 5–15)
BUN: 24 mg/dL — ABNORMAL HIGH (ref 8–23)
CO2: 22 mmol/L (ref 22–32)
Calcium: 9 mg/dL (ref 8.9–10.3)
Chloride: 108 mmol/L (ref 98–111)
Creatinine, Ser: 2.04 mg/dL — ABNORMAL HIGH (ref 0.61–1.24)
GFR, Estimated: 31 mL/min — ABNORMAL LOW
Glucose, Bld: 93 mg/dL (ref 70–99)
Potassium: 4.4 mmol/L (ref 3.5–5.1)
Sodium: 140 mmol/L (ref 135–145)

## 2024-07-04 LAB — URINE CULTURE: Culture: NO GROWTH

## 2024-07-04 LAB — CBC
HCT: 31.3 % — ABNORMAL LOW (ref 39.0–52.0)
Hemoglobin: 9.6 g/dL — ABNORMAL LOW (ref 13.0–17.0)
MCH: 28 pg (ref 26.0–34.0)
MCHC: 30.7 g/dL (ref 30.0–36.0)
MCV: 91.3 fL (ref 80.0–100.0)
Platelets: 224 10*3/uL (ref 150–400)
RBC: 3.43 MIL/uL — ABNORMAL LOW (ref 4.22–5.81)
RDW: 12.9 % (ref 11.5–15.5)
WBC: 7.5 10*3/uL (ref 4.0–10.5)
nRBC: 0 % (ref 0.0–0.2)

## 2024-07-04 MED ORDER — MIDAZOLAM HCL (PF) 2 MG/2ML IJ SOLN
INTRAMUSCULAR | Status: AC | PRN
  Administered 2024-07-04: 1 mg via INTRAVENOUS

## 2024-07-04 MED ORDER — ORAL CARE MOUTH RINSE: 15.0000 mL | OROMUCOSAL | Status: DC | PRN

## 2024-07-04 MED ORDER — IOHEXOL 300 MG/ML  SOLN
50.0000 mL | Freq: Once | INTRAMUSCULAR | Status: AC | PRN
  Administered 2024-07-04: 10 mL

## 2024-07-04 MED ORDER — FENTANYL CITRATE (PF) 100 MCG/2ML IJ SOLN
INTRAMUSCULAR | Status: AC | PRN
  Administered 2024-07-04: 50 ug via INTRAVENOUS

## 2024-07-04 MED ORDER — LIDOCAINE-EPINEPHRINE 1 %-1:100000 IJ SOLN
INTRAMUSCULAR | Status: AC
  Filled 2024-07-04: qty 20

## 2024-07-04 MED ORDER — LIDOCAINE-EPINEPHRINE 1 %-1:100000 IJ SOLN
20.0000 mL | Freq: Once | INTRAMUSCULAR | Status: AC
  Administered 2024-07-04: 10 mL via INTRADERMAL

## 2024-07-04 MED ORDER — FENTANYL CITRATE (PF) 100 MCG/2ML IJ SOLN
INTRAMUSCULAR | Status: AC
  Filled 2024-07-04: qty 2

## 2024-07-04 MED ORDER — SODIUM CHLORIDE 0.9 % IV SOLN
2.0000 g | Freq: Once | INTRAVENOUS | Status: AC
  Administered 2024-07-04: 2 g via INTRAVENOUS
  Filled 2024-07-04: qty 20

## 2024-07-04 MED ORDER — MIDAZOLAM HCL 2 MG/2ML IJ SOLN
INTRAMUSCULAR | Status: AC
  Filled 2024-07-04: qty 2

## 2024-07-04 MED ORDER — LACTATED RINGERS IV SOLN: INTRAVENOUS | Status: AC

## 2024-07-04 NOTE — Sedation Documentation (Addendum)
 RN Franceen Erisman pulled 2 mg Versed  and 100 mcg Fentanyl  in IR room pysix. Pt. Received 2 mg Versed  and 50 mcg Fentanyl  throughout the procedure. RN Nikhita Mentzel wasted 50 mcg Fentanyl  with Glass Blower/designer.

## 2024-07-04 NOTE — Procedures (Signed)
 Interventional Radiology Procedure:   Indications: Bladder cancer and right hydronephrosis  Procedure: Placement of right nephrostomy tube  Findings: Moderate right hydronephrosis.  10 Fr nephrostomy tube placed.    Complications: None     EBL: Minimal  Plan: Nephrostomy tube attached to gravity bag.  We can try to place an antegrade ureter stent or nephroureteral tube in near future per Urology recommendations.   Dakin Madani R. Philip, MD  Pager: [phone]

## 2024-07-04 NOTE — TOC Initial Note (Signed)
 Transition of Care [facility]) - Initial/Assessment Note   Patient Details  Name: William Patel MRN: [id number] Date of Birth: 1933/06/24  [facility] of [facility]) CM/SW Contact:    Duwaine GORMAN Aran, LCSW Phone Number: 07/04/2024, 9:09 AM  Clinical Narrative: Patient is from home with spouse. Awaiting nephrostomy tube placement. Care management to follow for possible discharge needs.  Expected Discharge Plan: Home/Self Care Barriers to Discharge: Continued Medical Work up  Expected Discharge Plan and Services In-house Referral: Clinical Social Work Living arrangements for the past 2 months: Single Family Home            DME Arranged: N/A DME Agency: NA  Prior Living Arrangements/Services Living arrangements for the past 2 months: Single Family Home Lives with:: Spouse Patient language and need for interpreter reviewed:: Yes Do you feel safe going back to the place where you live?: Yes      Need for Family Participation in Patient Care: No (Comment) Care giver support system in place?: Yes (comment) Criminal Activity/Legal Involvement Pertinent to Current Situation/Hospitalization: No - Comment as needed  Activities of Daily Living ADL Screening (condition at time of admission) Independently performs ADLs?: Yes (appropriate for developmental age) Is the patient deaf or have difficulty hearing?: No Does the patient have difficulty seeing, even when wearing glasses/contacts?: No Does the patient have difficulty concentrating, remembering, or making decisions?: No  Emotional Assessment Orientation: : Oriented to Self, Oriented to Place, Oriented to  Time, Oriented to Situation Alcohol / Substance Use: Not Applicable Psych Involvement: No (comment)  Admission diagnosis:  Obstructive uropathy [N13.9] Patient Active Problem List   Diagnosis Date Noted   Obstructive uropathy 07/03/2024   Bladder tumor 07/03/2024   Chronic anemia 07/03/2024   Hematemesis 02/15/2023   CKD stage 3b, GFR  30-44 ml/min (HCC) 02/15/2023   Peptic ulcer disease 02/15/2023   Upper GI bleed 07/24/2022   History of cerebrovascular accident (CVA) with residual deficit 07/24/2022   Carotid stenosis 08/08/2017   Smoker 08/08/2017   HLD (hyperlipidemia) 08/08/2017   Stroke (cerebrum) (HCC) 08/06/2017   AKI (acute kidney injury) 03/27/2016   Chronic kidney disease, stage 3a (HCC) 03/27/2016   Hypertension 03/27/2016   Chronic diastolic CHF (congestive heart failure) (HCC) 03/27/2016   Chest pain, midsternal 06/14/2012   Abdominal pain- LLQ 06/14/2012   Near syncope 06/14/2012   Vertigo 06/14/2012   Diverticulitis of sigmoid colon 06/14/2012   PVC (premature ventricular contraction) 08/13/2011   Pre-operative cardiovascular examination 08/13/2011   Colon cancer-proximal descending left colon 08/06/2011   Tobacco use-chews 08/06/2011   PCP:  Loring Tanda Mae, MD Pharmacy:   [facility] - [place], [place] - [address] [place] [address] Phone: [phone] Fax: [phone]  Social Drivers of Health (SDOH) Social History: SDOH Screenings   Food Insecurity: No Food Insecurity (07/03/2024)  Housing: Low Risk (07/03/2024)  Transportation Needs: No Transportation Needs (07/03/2024)  Utilities: Not At Risk (07/03/2024)  Social Connections: Unknown (07/03/2024)  Tobacco Use: High Risk (07/03/2024)   SDOH Interventions:    Readmission Risk Interventions     No data to display

## 2024-07-05 LAB — BASIC METABOLIC PANEL WITH GFR
Anion gap: 10 (ref 5–15)
BUN: 21 mg/dL (ref 8–23)
CO2: 24 mmol/L (ref 22–32)
Calcium: 9.3 mg/dL (ref 8.9–10.3)
Chloride: 108 mmol/L (ref 98–111)
Creatinine, Ser: 1.61 mg/dL — ABNORMAL HIGH (ref 0.61–1.24)
GFR, Estimated: 41 mL/min — ABNORMAL LOW
Glucose, Bld: 99 mg/dL (ref 70–99)
Potassium: 4.2 mmol/L (ref 3.5–5.1)
Sodium: 141 mmol/L (ref 135–145)

## 2024-07-05 LAB — CBC WITH DIFFERENTIAL/PLATELET
Abs Immature Granulocytes: 0.03 10*3/uL (ref 0.00–0.07)
Basophils Absolute: 0 10*3/uL (ref 0.0–0.1)
Basophils Relative: 0 %
Eosinophils Absolute: 0.1 10*3/uL (ref 0.0–0.5)
Eosinophils Relative: 2 %
HCT: 32.1 % — ABNORMAL LOW (ref 39.0–52.0)
Hemoglobin: 10 g/dL — ABNORMAL LOW (ref 13.0–17.0)
Immature Granulocytes: 1 %
Lymphocytes Relative: 34 %
Lymphs Abs: 2.2 10*3/uL (ref 0.7–4.0)
MCH: 28.6 pg (ref 26.0–34.0)
MCHC: 31.2 g/dL (ref 30.0–36.0)
MCV: 91.7 fL (ref 80.0–100.0)
Monocytes Absolute: 0.6 10*3/uL (ref 0.1–1.0)
Monocytes Relative: 9 %
Neutro Abs: 3.5 10*3/uL (ref 1.7–7.7)
Neutrophils Relative %: 54 %
Platelets: 227 10*3/uL (ref 150–400)
RBC: 3.5 MIL/uL — ABNORMAL LOW (ref 4.22–5.81)
RDW: 12.8 % (ref 11.5–15.5)
WBC: 6.4 10*3/uL (ref 4.0–10.5)
nRBC: 0 % (ref 0.0–0.2)

## 2024-07-05 MED ORDER — ATORVASTATIN CALCIUM 40 MG PO TABS: 40.0000 mg | ORAL_TABLET | Freq: Every day | ORAL | Status: AC

## 2024-07-05 NOTE — Evaluation (Signed)
 Occupational Therapy Evaluation Patient Details Name: HRIDHAAN YOHN MRN: [id number] DOB: 1933-09-07 Today's Date: 07/05/2024   History of Present Illness   89 yo male presents to therapy following hospital admission on 07/03/2024 due to R LQ abdominal and back pain. Pt underwent placement of R nephrostomy tube secondary to R hydronephrosis. Pt has hx of bladder ca and underwent transurethral resection of R sided bladder tumor on 05/30/2025. Pt is tentatively scheduled for TURBT on 2/12. Pt PMH includes but is not limited to: bladder ca, colon ca s/p resection, anemia, GERD, HTN, diverticulosis, visual disturbances, back surgery, HLD, CKD III, PUD, and CVA (2019) with late effects R side including facial droop, slurred speech, R UE weakness.     Clinical Impressions Pt admitted with above. Prior to hospital admission, pt was independent in ADL/mobility. Pt lives with family, has hx of CVA with R-sided weakness and decreased functional use of R hand with distal contractures at MCP joints. Pt presents to acute OT with deficits in strength & activity tolerance. Pt currently requires CGA-supervision for functional mobility 200+ ft in hallway no AD, demonstrates ability to perform toileting tasks at bathroom level using standard commode + GB, manages clothing and nephrostomy tube in R hand supervision.  Pt would benefit from skilled OT services to address noted impairments and functional limitations (see below for any additional details) in order to maximize safety and independence while minimizing falls risk and caregiver burden. Do not anticipate the need for follow up OT services upon acute hospital DC.       If plan is discharge home, recommend the following:   A little help with walking and/or transfers;A little help with bathing/dressing/bathroom;Assist for transportation;Help with stairs or ramp for entrance     Functional Status Assessment   Patient has had a recent decline in their  functional status and demonstrates the ability to make significant improvements in function in a reasonable and predictable amount of time.     Equipment Recommendations   None recommended by OT      Precautions/Restrictions   Precautions Precautions: Fall Recall of Precautions/Restrictions: Intact Precaution/Restrictions Comments: R neph tube, hx of CVA with residual R-sided deficits including R hand contracture and visual disturbances Restrictions Weight Bearing Restrictions Per Provider Order: No     Mobility Bed Mobility Overal bed mobility: Needs Assistance             General bed mobility comments: NT, in chair start and end of session Patient Response: Cooperative  Transfers Overall transfer level: Needs assistance Equipment used: None   Sit to Stand: Supervision                  Balance Overall balance assessment: No apparent balance deficits (not formally assessed)                                         ADL either performed or assessed with clinical judgement   ADL Overall ADL's : Needs assistance/impaired Eating/Feeding: Sitting;Modified independent Eating/Feeding Details (indicate cue type and reason): increased time due to impaired bilateral coordination given R hand deficits from prior CVA Grooming: Sitting;Set up   Upper Body Bathing: Sitting;Set up   Lower Body Bathing: Sit to/from stand;Minimal assistance   Upper Body Dressing : Sitting;Set up   Lower Body Dressing: Sit to/from stand;Minimal assistance   Toilet Transfer: Supervision/safety;Grab bars;Contact guard Armed Forces Operational Officer Details (indicate  cue type and reason): able to ambulate 200+ ft in hallway no AD, CGA fading to supervision. completes toilet transfer on standard commode with GB use. Toileting- Clothing Manipulation and Hygiene: Supervision/safety;Sit to/from stand Toileting - Clothing Manipulation Details (indicate cue type and  reason): supervision for clothing management, pt holding nephrostomy bag in RUE     Functional mobility during ADLs: Supervision/safety;Contact guard assist General ADL Comments: CGA fading to supervision for mobility in hallway, no AD. decreased dynamic balance with CGA for tight turns but no overt LOB. pt tends to hold R hand at chest while walking, able to extend elbow if prompted. pt close to baseline for ADL     Vision Patient Visual Report: No change from baseline Additional Comments: hx of visual disturbances with noted impaired bilateral eye alignment, no change from baseline            Pertinent Vitals/Pain Pain Assessment Pain Assessment: No/denies pain     Extremity/Trunk Assessment Upper Extremity Assessment Upper Extremity Assessment: Left hand dominant;RUE deficits/detail RUE Deficits / Details: hx of CVA. Pt has shoulder + elbow AROM, distally AROM decreases from elbow to digits with hand contracture. Pt is able to open hand with PROM, skin noted to be intact with palm. Pt able to grasp nephrostomy tube bag with RUE and hold during toileting tasks and has some active movement in digits distally at DIP/PIP. RUE Coordination: decreased fine motor;decreased gross motor   Lower Extremity Assessment Lower Extremity Assessment: Overall WFL for tasks assessed   Cervical / Trunk Assessment Cervical / Trunk Assessment: Normal   Communication Communication Communication: Impaired Factors Affecting Communication: Reduced clarity of speech (dysarthria from prior CVA)   Cognition Arousal: Alert Behavior During Therapy: WFL for tasks assessed/performed Cognition: No apparent impairments                               Following commands: Intact                  Home Living Family/patient expects to be discharged to:: Private residence Living Arrangements: Spouse/significant other Available Help at Discharge: Family Type of Home: House Home Access:  Stairs to enter Secretary/administrator of Steps: 3 Entrance Stairs-Rails: Left;Right Home Layout: Two level;Able to live on main level with bedroom/bathroom;Laundry or work area in basement Alternate Teacher, Music of Steps: flight   Bathroom Shower/Tub: Chief Strategy Officer: Standard     Home Equipment: Agricultural Consultant (2 wheels);Cane - single point          Prior Functioning/Environment Prior Level of Function : Independent/Modified Independent             Mobility Comments: independent, plays golf ADLs Comments: independent, family assists occassionally    OT Problem List: Decreased strength;Decreased range of motion;Decreased activity tolerance;Impaired UE functional use   OT Treatment/Interventions: Self-care/ADL training;DME and/or AE instruction;Therapeutic activities;Neuromuscular education;Patient/family education;Energy conservation      OT Goals(Current goals can be found in the care plan section)   Acute Rehab OT Goals OT Goal Formulation: With patient Time For Goal Achievement: 07/19/24 Potential to Achieve Goals: Good   OT Frequency:  Min 2X/week       AM-PAC OT 6 Clicks Daily Activity     Outcome Measure Help from another person eating meals?: None Help from another person taking care of personal grooming?: A Little Help from another person toileting, which includes using toliet, bedpan, or urinal?: A Little Help  from another person bathing (including washing, rinsing, drying)?: A Little Help from another person to put on and taking off regular upper body clothing?: A Little Help from another person to put on and taking off regular lower body clothing?: A Little 6 Click Score: 19   End of Session Equipment Utilized During Treatment: Gait belt Nurse Communication: Mobility status  Activity Tolerance: Patient tolerated treatment well;No increased pain Patient left: in chair;with call bell/phone within reach  OT Visit  Diagnosis: Muscle weakness (generalized) (M62.81);Other abnormalities of gait and mobility (R26.89);Hemiplegia and hemiparesis Hemiplegia - Right/Left: Right Hemiplegia - dominant/non-dominant: Dominant Hemiplegia - caused by: Cerebral infarction                Time: 9070-9054 OT Time Calculation (min): 16 min Charges:  OT General Charges $OT Visit: 1 Visit OT Evaluation $OT Eval Low Complexity: 1 Low  Shakea Isip L. Cailah Reach, OTR/L  07/05/24, 10:28 AM

## 2024-07-05 NOTE — Discharge Summary (Signed)
 Physician Discharge Summary  William Patel FMW:[id number] DOB: [birth year] DOA: 07/03/2024  PCP: Loring Tanda Mae, MD  Admit date: 07/03/2024 Discharge date: 07/05/2024  Admitted From: Home Disposition: Home  Recommendations for Outpatient Follow-up:  Follow up with PCP in 1 week with repeat CBC/BMP Outpatient follow-up with urology and interventional radiology Follow up in ED if symptoms worsen or new appear   Home Health: No Equipment/Devices: Right percutaneous nephrostomy tube by IR on 07/04/2024  Discharge Condition: Stable CODE STATUS: DNR Diet recommendation: Heart healthy  Brief/Interim Summary: 89 y.o. male with medical history significant of bladder and colon cancer, history of transurethral resection of bladder with gemcitabine  instillation on 05/30/2024, anemia, hypertension, hyperlipidemia, CVA with residual right-sided facial droop/slurred speech/right upper extremity weakness, PUD, CKD stage IIIa presented with right lower abdominal pain.  Recent outpatient CT of abdomen and pelvis had shown moderate right-sided hydronephrosis and hydroureter with possible distal ureteral obstruction with possible recurrent bladder tumor for which urology had plans for outpatient repeat TURBT and right ureteral stent placement.  On presentation, patient was afebrile with no leukocytosis.  Urology was consulted.  IR was subsequently consulted.  Patient underwent right percutaneous nephrostomy tube placement by IR on 07/04/2024.  Tube is draining well.  Urology has cleared the patient for discharge as recommended outpatient follow-up with urology.  Discharge patient home today.  Discharge Diagnoses:   Recurrent bladder tumor at the ureterovesical junction causing distal ureteral obstruction and new moderate right-sided hydronephrosis and hydroureter AKI on CKD stage IIIa - Urology was consulted.  IR was subsequently consulted.  Patient underwent right percutaneous nephrostomy tube placement by  IR on 07/04/2024.  Tube is draining well.  Urology has cleared the patient for discharge as recommended outpatient follow-up with urology.  Discharge patient home today.  Kidney function improving.  Outpatient follow-up of kidney function - Outpatient follow-up with IR  Anemia of chronic disease - From chronic illnesses.  Hemoglobin stable.  Monitor intermittently  Hypertension Hyperlipidemia History of unspecified CVA -Continue Lipitor.  Not on any antihypertensives at home.  Plavix  to remain on hold for now till reevaluation by PCP and urology  PUD - Continue Protonix   Peripheral neuropathy/chronic pain - Continue gabapentin      Discharge Instructions  Discharge Instructions     Ambulatory referral to Interventional Radiology   Complete by: As directed    Diet - low sodium heart healthy   Complete by: As directed    Increase activity slowly   Complete by: As directed    No wound care   Complete by: As directed       Allergies as of 07/05/2024   No Known Allergies      Medication List     STOP taking these medications    amoxicillin-clavulanate 500-125 MG tablet Commonly known as: AUGMENTIN   B-complex with vitamin C tablet   ciprofloxacin 250 MG tablet Commonly known as: CIPRO   clopidogrel  75 MG tablet Commonly known as: PLAVIX    nystatin 100000 UNIT/ML suspension Commonly known as: MYCOSTATIN   nystatin cream Commonly known as: MYCOSTATIN   sucralfate  1 GM/10ML suspension Commonly known as: CARAFATE        TAKE these medications    atorvastatin  40 MG tablet Commonly known as: Lipitor Take 1 tablet (40 mg total) by mouth at bedtime.   Dramamine 25 MG tablet Generic drug: meclizine  Take 25 mg by mouth 3 (three) times daily as needed for dizziness.   ferrous sulfate  325 (65 FE) MG tablet Take 1 tablet (  325 mg total) by mouth daily with breakfast.   gabapentin  100 MG capsule Commonly known as: NEURONTIN  Take 100 mg by mouth 3 (three) times  daily.   pantoprazole  40 MG tablet Commonly known as: PROTONIX  Take 40 mg by mouth at bedtime.   predniSONE 5 MG tablet Commonly known as: DELTASONE Take 2.5-10 mg by mouth daily as needed (when sick- as directed by provider- TAKE WITH FOOD).   TYLENOL  500 MG tablet Generic drug: acetaminophen  Take 500-1,000 mg by mouth every 8 (eight) hours as needed for mild pain (pain score 1-3) (or headaches).        Follow-up Information     Loring Tanda Mae, MD. Schedule an appointment as soon as possible for a visit in 1 week(s).   Specialty: Family Medicine Contact information: [address] [place] [address] [phone]         [facility]. Schedule an appointment as soon as possible for a visit in 1 week(s).   Contact information: [address] [place] [address] [place]  [address] [phone]               Allergies[1]  Consultations: urology/IR   Procedures/Studies: IR NEPHROSTOMY PLACEMENT RIGHT Result Date: 07/04/2024 INDICATION: 89 year old with bladder cancer and right hydronephrosis. Patient needs decompression of the right renal collecting system. EXAM: PLACEMENT OF RIGHT NEPHROSTOMY TUBE USING ULTRASOUND AND FLUOROSCOPIC GUIDANCE MEDICATIONS: Rocephin  2 g; The antibiotic was administered in an appropriate time frame prior to skin puncture. ANESTHESIA/SEDATION: Moderate (conscious) sedation was employed during this procedure. A total of Versed  2 mg and fentanyl  50 mcg was administered intravenously at the order of the provider performing the procedure. Total intra-service moderate sedation time: 13 minutes. Patient's level of consciousness and vital signs were monitored continuously by radiology nurse throughout the procedure under the supervision of the provider performing the procedure. CONTRAST:  10 mL Omnipaque  300 FLUOROSCOPY TIME:  Radiation Exposure Index (as provided by the fluoroscopic device): 4 mGy Kerma COMPLICATIONS:  None immediate. PROCEDURE: The procedure was explained to the patient/family. The risks and benefits of the procedure were discussed and the patient's questions were addressed. Informed consent was obtained from the patient/family. Time-out was performed. Patient was placed prone on the interventional table. Right flank was prepped and draped in sterile fashion. Maximal barrier sterile technique was utilized including caps, mask, sterile gowns, sterile gloves, sterile drape, hand hygiene and skin antiseptic. Ultrasound was used to identify the right kidney. Ultrasound image was saved for documentation. Right flank was anesthetized with 1% lidocaine . Small incision was made. Using ultrasound guidance, 21 gauge needle was directed into a lower pole calyx. Clear urine was draining from the needle. Contrast injection confirmed placement in the collecting system. 0.018 wire was placed. Accustick dilator set was placed. J wire was placed. The tract was dilated to accommodate a 10 French multipurpose drain. 10 French drain was reconstituted in the renal pelvis. Urine was aspirated. Contrast injection confirmed placement in the renal collecting system. Drain was sutured to skin and attached to a gravity bag. Dressing was placed. Fluoroscopic and ultrasound images were taken and saved for documentation. FINDINGS: Moderate right hydronephrosis. Access obtained from a dilated lower pole calyx. Nephrostomy tube reconstituted in the renal pelvis. IMPRESSION: Successful placement of a right percutaneous nephrostomy tube using ultrasound and fluoroscopic guidance. Electronically Signed   By: Juliene Balder M.D.   On: 07/04/2024 14:17   CT ABDOMEN PELVIS W CONTRAST Result Date: 06/28/2024 CLINICAL DATA:  Abdominal pain, acute,  nonlocalized. History of UTI and bladder cancer. * Tracking Code: BO * EXAM: CT ABDOMEN AND PELVIS WITH CONTRAST TECHNIQUE: Multidetector CT imaging of the abdomen and pelvis was performed using the standard  protocol following bolus administration of intravenous contrast. RADIATION DOSE REDUCTION: This exam was performed according to the departmental dose-optimization program which includes automated exposure control, adjustment of the mA and/or kV according to patient size and/or use of iterative reconstruction technique. CONTRAST:  80mL OMNIPAQUE  IOHEXOL  300 MG/ML  SOLN COMPARISON:  Abdominopelvic CT 04/11/2024.  CTA 02/15/2023. FINDINGS: Lower chest: Similar chronic scarring at both lung bases. No confluent airspace disease, suspicious nodularity or pleural effusion. There are aortic and coronary artery calcifications. Hepatobiliary: The liver has a non cirrhotic morphology without suspicious focal abnormality. Scattered hepatic cysts are unchanged. No evidence of gallstones, gallbladder wall thickening or biliary dilatation. Pancreas: Unremarkable. No pancreatic ductal dilatation or surrounding inflammatory changes. Spleen: Normal in size without focal abnormality. Adrenals/Urinary Tract: Both adrenal glands appear normal. New moderate asymmetric right-sided hydronephrosis with associated perinephric soft tissue stranding and delayed contrast excretion. The right ureter is dilated to the ureterovesical junction. There is persistent irregular right-sided bladder wall thickening extending anteriorly from the ureterovesical junction, concerning for recurrent tumor. No evidence of ureteral or other urinary tract calculus. Mild asymmetric right renal cortical thinning without focal mass lesion. The left renal collecting system is partially duplicated without evidence of obstruction. Stomach/Bowel: No enteric contrast administered. The stomach appears unremarkable for its degree of distention. No evidence of bowel distension, wall thickening or surrounding inflammation. Proximal duodenal diverticula are again noted. The appendix appears normal. Mildly prominent stool throughout the colon. There are diverticular changes  throughout the descending and sigmoid colon. Vascular/Lymphatic: There are no enlarged abdominal or pelvic lymph nodes. Aortic and branch vessel atherosclerosis without evidence of aneurysm or large vessel occlusion. Reproductive: The prostate gland appears stable. Other: Stable small left inguinal hernia containing fat. Stable small umbilical hernia containing fat. No ascites or pneumoperitoneum. Musculoskeletal: No acute or significant osseous findings. Stable chronic T11 compression deformity and multilevel spondylosis. IMPRESSION: 1. New moderate right-sided hydronephrosis and hydroureter with associated perinephric soft tissue stranding and delayed contrast excretion consistent with distal ureteral obstruction. Persistent irregular right-sided bladder wall thickening extending anteriorly from the ureterovesical junction, concerning for recurrent tumor. Recommend urology consultation/follow-up. No evidence of ureteral or other urinary tract calculus. 2. No evidence of metastatic disease. 3. Distal colonic diverticulosis without evidence of acute inflammation. 4.  Aortic Atherosclerosis (ICD10-I70.0). Electronically Signed   By: Elsie Perone M.D.   On: 06/28/2024 14:40      Subjective: Patient seen and examined at bedside.  Poor historian.  Physically cooperative.  No fever, vomiting, agitation reported.  Discharge Exam: Vitals:   07/04/24 2038 07/05/24 0548  BP: (!) 142/57 117/61  Pulse: 61 70  Resp: 15 15  Temp: 98 F (36.7 C) 98.5 F (36.9 C)  SpO2: 100% 98%    General: Pt is alert, awake, not in acute distress.  Elderly male sitting on chair.  Slow to respond, poor historian.  Slightly slurred speech.  Right nephrostomy tube present Cardiovascular: rate controlled, S1/S2 + Respiratory: bilateral decreased breath sounds at bases Abdominal: Soft, NT, ND, bowel sounds + Extremities: no edema, no cyanosis    The results of significant diagnostics from this hospitalization (including  imaging, microbiology, ancillary and laboratory) are listed below for reference.     Microbiology: Recent Results (from the past 240 hours)  Urine Culture  Status: None   Collection Time: 06/28/24 12:07 PM   Specimen: Urine, Random  Result Value Ref Range Status   Specimen Description   Final    URINE, RANDOM Performed at [facility], [address] [place], [place] [address]    Special Requests   Final    NONE Reflexed from [id number] Performed at [facility], [address] [place], [place] [address]    Culture   Final    NO GROWTH Performed at [facility], [address] [place], [place] [address]    Report Status 06/30/2024 FINAL  Final  Urine Culture (for pregnant, neutropenic or urologic patients or patients with an indwelling urinary catheter)     Status: None   Collection Time: 07/03/24  5:36 PM   Specimen: Urine, Clean Catch  Result Value Ref Range Status   Specimen Description   Final    URINE, CLEAN CATCH Performed at [facility], [address], [place], [place] [address]    Special Requests   Final    NONE Performed at [facility], [address] [place], [place] [address]    Culture   Final    NO GROWTH Performed at [facility], [address], [place], [place] [address]    Report Status 07/04/2024 FINAL  Final     Labs: BNP (last 3 results) No results for input(s): BNP in the last 8760 hours. Basic Metabolic Panel: Recent Labs  Lab 07/03/24 1623 07/04/24 0528 07/05/24 0535  NA 139 140 141  K 4.1 4.4 4.2  CL 104 108 108  CO2 21* 22 24  GLUCOSE 111* 93 99  BUN 29* 24* 21  CREATININE 2.20* 2.04* 1.61*  CALCIUM  9.7 9.0 9.3   Liver Function Tests: Recent Labs  Lab 07/03/24 1623  AST 19  ALT 12  ALKPHOS 87  BILITOT 0.3  PROT 7.3  ALBUMIN 4.1   Recent Labs  Lab 07/03/24 1623  LIPASE 32   No results for input(s): AMMONIA in the last 168  hours. CBC: Recent Labs  Lab 07/03/24 1623 07/04/24 0528 07/05/24 0535  WBC 10.6* 7.5 6.4  NEUTROABS 7.7  --  3.5  HGB 11.3* 9.6* 10.0*  HCT 36.4* 31.3* 32.1*  MCV 91.0 91.3 91.7  PLT 275 224 227   Cardiac Enzymes: No results for input(s): CKTOTAL, CKMB, CKMBINDEX, TROPONINI in the last 168 hours. BNP: Invalid input(s): POCBNP CBG: Recent Labs  Lab 07/04/24 0830  GLUCAP 102*   D-Dimer No results for input(s): DDIMER in the last 72 hours. Hgb A1c No results for input(s): HGBA1C in the last 72 hours. Lipid Profile No results for input(s): CHOL, HDL, LDLCALC, TRIG, CHOLHDL, LDLDIRECT in the last 72 hours. Thyroid  function studies No results for input(s): TSH, T4TOTAL, T3FREE, THYROIDAB in the last 72 hours.  Invalid input(s): FREET3 Anemia work up No results for input(s): VITAMINB12, FOLATE, FERRITIN, TIBC, IRON, RETICCTPCT in the last 72 hours. Urinalysis    Component Value Date/Time   COLORURINE STRAW (A) 07/03/2024 1736   APPEARANCEUR CLEAR 07/03/2024 1736   LABSPEC 1.010 07/03/2024 1736   PHURINE 5.0 07/03/2024 1736   GLUCOSEU NEGATIVE 07/03/2024 1736   HGBUR SMALL (A) 07/03/2024 1736   BILIRUBINUR NEGATIVE 07/03/2024 1736   KETONESUR NEGATIVE 07/03/2024 1736   PROTEINUR NEGATIVE 07/03/2024 1736   UROBILINOGEN 0.2 12/24/2013 1232   NITRITE NEGATIVE 07/03/2024 1736   LEUKOCYTESUR MODERATE (A) 07/03/2024 1736   Sepsis Labs Recent  Labs  Lab 07/03/24 1623 07/04/24 0528 07/05/24 0535  WBC 10.6* 7.5 6.4   Microbiology Recent Results (from the past 240 hours)  Urine Culture     Status: None   Collection Time: 06/28/24 12:07 PM   Specimen: Urine, Random  Result Value Ref Range Status   Specimen Description   Final    URINE, RANDOM Performed at [facility], [address], [place], [place] [address]    Special Requests   Final    NONE Reflexed from [id number] Performed at [facility], [address], [place], [place] [address]    Culture   Final    NO GROWTH Performed at [facility], [address], [place], [place] [address]    Report Status 06/30/2024 FINAL  Final  Urine Culture (for pregnant, neutropenic or urologic patients or patients with an indwelling urinary catheter)     Status: None   Collection Time: 07/03/24  5:36 PM   Specimen: Urine, Clean Catch  Result Value Ref Range Status   Specimen Description   Final    URINE, CLEAN CATCH Performed at [facility], [address], [place], [place] [address]    Special Requests   Final    NONE Performed at [facility], [address] [place], [place] [address]    Culture   Final    NO GROWTH Performed at [facility], [address] [place], [place] [address]    Report Status 07/04/2024 FINAL  Final     Time coordinating discharge: 35 minutes  SIGNED:   Sophie Mao, MD  [facility] 07/05/2024, 10:20 AM      [1] No Known Allergies

## 2024-07-05 NOTE — TOC Transition Note (Signed)
 Transition of Care [facility]) - Discharge Note  Patient Details  Name: William Patel MRN: [id number] Date of Birth: September 08, 1933  [facility] CM/SW Contact:  Duwaine GORMAN Aran, LCSW Phone Number: 07/05/2024, 11:26 AM  Clinical Narrative: CSW notified by RN that patient does not have a ride home as his elderly wife is unable to pick him up. Patient signed rider waiver, which was placed on chart. Taxi voucher provided to [employer]. Care management signing off.  Final next level of care: Home/Self Care Barriers to Discharge: Barriers Resolved  Patient Goals and CMS Choice Patient states their goals for this hospitalization and ongoing recovery are:: Return home Choice offered to / list presented to : NA  Discharge Plan and Services Additional resources added to the After Visit Summary for   In-house Referral: Clinical Social Work        DME Arranged: N/A DME Agency: NA  Social Drivers of Health (SDOH) Interventions SDOH Screenings   Food Insecurity: No Food Insecurity (07/03/2024)  Housing: Low Risk (07/03/2024)  Transportation Needs: No Transportation Needs (07/03/2024)  Utilities: Not At Risk (07/03/2024)  Social Connections: Unknown (07/03/2024)  Tobacco Use: High Risk (07/03/2024)   Readmission Risk Interventions     No data to display

## 2024-07-05 NOTE — Progress Notes (Signed)
 "   Referring Physician(s): Elisabeth HERO  Supervising Physician: Hughes Simmonds  Patient Status:  [facility] - In-pt  Chief Complaint:  Bladder cancer, Right hydronephrosis; S/p right nephrostomy tube placement by Dr. Philip on 07/04/24.  Subjective:  Patient is sitting up in the hospital chair eating his breakfast. He is alert, oriented, and in no distress. He mentions mild discomfort where the nephrostomy tube was placed yesterday, but no other complaints.   Allergies: Patient has no known allergies.  Medications: Prior to Admission medications  Medication Sig Start Date End Date Taking? Authorizing Provider  atorvastatin  (LIPITOR) 40 MG tablet Take 1 tablet (40 mg total) by mouth daily. Patient taking differently: Take 40 mg by mouth at bedtime. 12/21/17  Yes McCue, Harlene, NP  ciprofloxacin (CIPRO) 250 MG tablet Take 250 mg by mouth daily. 06/27/24  Yes [provider]  [Paused] clopidogrel  (PLAVIX ) 75 MG tablet Take 1 tablet (75 mg total) by mouth daily. Patient taking differently: Take 75 mg by mouth at bedtime. Wait to take this until your doctor or other care provider tells you to start again. 07/31/22  Yes Patel, Pranav M, MD  DRAMAMINE 25 MG tablet Take 25 mg by mouth 3 (three) times daily as needed for dizziness.   Yes [provider]  gabapentin  (NEURONTIN ) 100 MG capsule Take 100 mg by mouth 3 (three) times daily. 05/04/24  Yes [provider]  pantoprazole  (PROTONIX ) 40 MG tablet Take 40 mg by mouth at bedtime.   Yes [provider]  TYLENOL  500 MG tablet Take 500-1,000 mg by mouth every 8 (eight) hours as needed for mild pain (pain score 1-3) (or headaches).   Yes [provider]  amoxicillin-clavulanate (AUGMENTIN) 500-125 MG tablet Take 1 tablet by mouth 2 (two) times daily. Patient not taking: Reported on 07/03/2024    [provider]  B Complex-C (B-COMPLEX WITH VITAMIN C) tablet Take 1 tablet by mouth daily. Patient not taking:  Reported on 06/28/2024 07/25/22   Tobie Yetta HERO, MD  ferrous sulfate  325 (65 FE) MG tablet Take 1 tablet (325 mg total) by mouth daily with breakfast. Patient not taking: Reported on 06/28/2024 07/25/22   Patel, Pranav M, MD  nystatin (MYCOSTATIN) 100000 UNIT/ML suspension Take 5 mLs by mouth at bedtime as needed (for ongoing yeast issue - gargle and spit). Patient not taking: Reported on 07/03/2024    [provider]  nystatin cream (MYCOSTATIN) Apply 1 Application topically See admin instructions. Apply to the affected area of the mouth (right side) 2 times a day as needed Patient not taking: Reported on 07/03/2024    [provider]  predniSONE (DELTASONE) 5 MG tablet Take 2.5-10 mg by mouth daily as needed (when sick- as directed by provider- TAKE WITH FOOD). Patient not taking: Reported on 07/03/2024    [provider]  sucralfate  (CARAFATE ) 1 GM/10ML suspension Take 10 mLs (1 g total) by mouth 4 (four) times daily -  with meals and at bedtime. Patient not taking: Reported on 05/18/2024 02/16/23   Cornelius Dines, MD     Vital Signs: BP 117/61 (BP Location: Right Arm)   Pulse 70   Temp 98.5 F (36.9 C)   Resp 15   Ht 5' 2 (1.575 m)   Wt 140 lb 14 oz (63.9 kg)   SpO2 98%   BMI 25.77 kg/m   Physical Exam Constitutional:      General: He is not in acute distress.    Appearance: Normal appearance.  Musculoskeletal:  Comments: Mild tenderness to R nephrostomy tube site  Skin:    General: Skin is warm and dry.     Findings: No erythema.     Comments: R nephrostomy tube in place, sutures secured and dressing intact. Dressing with small amount of dried blood. Bloody urine noted in gravity bag.   Neurological:     Mental Status: He is alert.  Psychiatric:        Mood and Affect: Mood normal.        Behavior: Behavior normal.    Imaging: IR NEPHROSTOMY PLACEMENT RIGHT Result Date: 07/04/2024 INDICATION: 89 year old with bladder cancer and right  hydronephrosis. Patient needs decompression of the right renal collecting system. EXAM: PLACEMENT OF RIGHT NEPHROSTOMY TUBE USING ULTRASOUND AND FLUOROSCOPIC GUIDANCE MEDICATIONS: Rocephin  2 g; The antibiotic was administered in an appropriate time frame prior to skin puncture. ANESTHESIA/SEDATION: Moderate (conscious) sedation was employed during this procedure. A total of Versed  2 mg and fentanyl  50 mcg was administered intravenously at the order of the provider performing the procedure. Total intra-service moderate sedation time: 13 minutes. Patient's level of consciousness and vital signs were monitored continuously by radiology nurse throughout the procedure under the supervision of the provider performing the procedure. CONTRAST:  10 mL Omnipaque  300 FLUOROSCOPY TIME:  Radiation Exposure Index (as provided by the fluoroscopic device): 4 mGy Kerma COMPLICATIONS: None immediate. PROCEDURE: The procedure was explained to the patient/family. The risks and benefits of the procedure were discussed and the patient's questions were addressed. Informed consent was obtained from the patient/family. Time-out was performed. Patient was placed prone on the interventional table. Right flank was prepped and draped in sterile fashion. Maximal barrier sterile technique was utilized including caps, mask, sterile gowns, sterile gloves, sterile drape, hand hygiene and skin antiseptic. Ultrasound was used to identify the right kidney. Ultrasound image was saved for documentation. Right flank was anesthetized with 1% lidocaine . Small incision was made. Using ultrasound guidance, 21 gauge needle was directed into a lower pole calyx. Clear urine was draining from the needle. Contrast injection confirmed placement in the collecting system. 0.018 wire was placed. Accustick dilator set was placed. J wire was placed. The tract was dilated to accommodate a 10 French multipurpose drain. 10 French drain was reconstituted in the renal  pelvis. Urine was aspirated. Contrast injection confirmed placement in the renal collecting system. Drain was sutured to skin and attached to a gravity bag. Dressing was placed. Fluoroscopic and ultrasound images were taken and saved for documentation. FINDINGS: Moderate right hydronephrosis. Access obtained from a dilated lower pole calyx. Nephrostomy tube reconstituted in the renal pelvis. IMPRESSION: Successful placement of a right percutaneous nephrostomy tube using ultrasound and fluoroscopic guidance. Electronically Signed   By: Juliene Balder M.D.   On: 07/04/2024 14:17    Labs:  CBC: Recent Labs    06/28/24 1013 07/03/24 1623 07/04/24 0528 07/05/24 0535  WBC 16.3* 10.6* 7.5 6.4  HGB 12.0* 11.3* 9.6* 10.0*  HCT 38.5* 36.4* 31.3* 32.1*  PLT 241 275 224 227    COAGS: Recent Labs    07/04/24 1007  INR 1.0    BMP: Recent Labs    06/28/24 1013 07/03/24 1623 07/04/24 0528 07/05/24 0535  NA 140 139 140 141  K 4.0 4.1 4.4 4.2  CL 106 104 108 108  CO2 20* 21* 22 24  GLUCOSE 107* 111* 93 99  BUN 27* 29* 24* 21  CALCIUM  9.7 9.7 9.0 9.3  CREATININE 2.02* 2.20* 2.04* 1.61*  GFRNONAA 31* 28*  31* 41*    LIVER FUNCTION TESTS: Recent Labs    06/28/24 1013 07/03/24 1623  BILITOT 0.5 0.3  AST 16 19  ALT 8 12  ALKPHOS 92 87  PROT 7.5 7.3  ALBUMIN 4.6 4.1    Assessment and Plan:  Bladder cancer, right hydronephrosis; S/p 10 Fr nephrostomy tube placement by Dr. Philip on 07/04/24. Patient afebrile, normal WBC, hemoglobin 10.  Patient instructed to monitor for signs such as new fever, no output in gravity bag, new bleeding, or pain. Advised patient to not get nephrostomy tube site wet during showering or bathing, keep covered and dry. Routinely dressing change after showering.   Patient to be followed by urology, Dr. Elisabeth  Electronically Signed: Greig FORBES Jasmine, PA-C 07/05/2024, 9:11 AM   I spent a total of 15 Minutes at the the patient's bedside AND on the patient's hospital  floor or unit, greater than 50% of which was counseling/coordinating care for s/p right nephrostomy tube placement.       "

## 2024-07-05 NOTE — TOC Progression Note (Signed)
 Transition of Care [facility]) - Progression Note    Patient Details  Name: William Patel MRN: [id number] Date of Birth: 17-Oct-1933  Transition of Care [facility] CM/SW Contact  Suzen Favor, LCSWA Phone Number: 07/05/2024, 9:58 AM  Clinical Narrative:     PT did not recommend any f/u or DME after discharge. IPCM to follow.   Expected Discharge Plan: Home/Self Care Barriers to Discharge: Continued Medical Work up               Expected Discharge Plan and Services In-house Referral: Clinical Social Work     Living arrangements for the past 2 months: Single Family Home                 DME Arranged: N/A DME Agency: NA                   Social Drivers of Health (SDOH) Interventions SDOH Screenings   Food Insecurity: No Food Insecurity (07/03/2024)  Housing: Low Risk (07/03/2024)  Transportation Needs: No Transportation Needs (07/03/2024)  Utilities: Not At Risk (07/03/2024)  Social Connections: Unknown (07/03/2024)  Tobacco Use: High Risk (07/03/2024)    Readmission Risk Interventions     No data to display

## 2024-07-07 ENCOUNTER — Encounter (HOSPITAL_COMMUNITY): Admission: RE | Admit: 2024-07-07 | Source: Ambulatory Visit

## 2024-07-07 DIAGNOSIS — I10 Essential (primary) hypertension: Secondary | ICD-10-CM

## 2024-07-07 NOTE — Patient Instructions (Signed)
 SURGICAL WAITING ROOM VISITATION  Patients having surgery or a procedure may have no more than 2 support people in the waiting area - these visitors may rotate.    Children ages 12 and under will not be able to visit patients in [facility] under most circumstances.   Visitors with respiratory illnesses are discouraged from visiting and should remain at home.  If the patient needs to stay at the hospital during part of their recovery, the visitor guidelines for inpatient rooms apply. Pre-op nurse will coordinate an appropriate time for 1 support person to accompany patient in pre-op.  This support person may not rotate.    Please refer to the [facility] website for the visitor guidelines for Inpatients (after your surgery is over and you are in a regular room).       Your procedure is scheduled on: 07-13-24   Report to [facility] Main Entrance    Report to admitting at    0730   AM   Call this number if you have problems the morning of surgery [phone]   Do not eat food   or drink liquids  :After Midnight.except sips of water  with meds                 If you have questions, please contact your surgeons office.   FOLLOW  AND ANY ADDITIONAL PRE OP INSTRUCTIONS YOU RECEIVED FROM YOUR SURGEON'S OFFICE!!!     Oral Hygiene is also important to reduce your risk of infection.                                    Remember - BRUSH YOUR TEETH THE MORNING OF SURGERY WITH YOUR REGULAR TOOTHPASTE  DENTURES WILL BE REMOVED PRIOR TO SURGERY PLEASE DO NOT APPLY Poly grip OR ADHESIVES!!!   Do NOT smoke after Midnight   Stop all vitamins and herbal supplements 7 days before surgery.   Take these medicines the morning of surgery with A SIP OF WATER :  gabapentin , antibiotics if you are still taking them    Bring CPAP mask and tubing day of surgery.                              You may not have any metal on your body including hair pins, jewelry, and body  piercing             Do not wear  lotions, powders, /cologne, or deodorant               Men may shave face and neck.   Do not bring valuables to the hospital. [facility] IS NOT             RESPONSIBLE   FOR VALUABLES.   Contacts, glasses, dentures or bridgework may not be worn into surgery.   Bring small overnight bag day of surgery.   DO NOT BRING YOUR HOME MEDICATIONS TO THE HOSPITAL. PHARMACY WILL DISPENSE MEDICATIONS LISTED ON YOUR MEDICATION LIST TO YOU DURING YOUR ADMISSION IN THE HOSPITAL!    Patients discharged on the day of surgery will not be allowed to drive home.  Someone NEEDS to stay with you for the first 24 hours after anesthesia.   Special Instructions: Bring a copy of your healthcare power of attorney and living will documents the day of surgery if you haven't  scanned them before.              Please read over the following fact sheets you were given: IF YOU HAVE QUESTIONS ABOUT YOUR PRE-OP INSTRUCTIONS PLEASE CALL [phone].   If you test positive for Covid or have been in contact with anyone that has tested positive in the last 10 days please notify you surgeon.    [facility] - Preparing for Surgery Before surgery, you can play an important role.  Because skin is not sterile, your skin needs to be as free of germs as possible.  You can reduce the number of germs on your skin by washing with CHG (chlorahexidine gluconate) soap before surgery.  CHG is an antiseptic cleaner which kills germs and bonds with the skin to continue killing germs even after washing. Please DO NOT use if you have an allergy to CHG or antibacterial soaps.  If your skin becomes reddened/irritated stop using the CHG and inform your nurse when you arrive at Short Stay. Do not shave (including legs and underarms) for at least 48 hours prior to the first CHG shower.  You may shave your face/neck. Please follow these instructions carefully:  1.  Shower with CHG Soap the night before surgery and  the  morning of Surgery.  2.  If you choose to wash your hair, wash your hair first as usual with your  normal  shampoo.  3.  After you shampoo, rinse your hair and body thoroughly to remove the  shampoo.                            4.  Use CHG as you would any other liquid soap.  You can apply chg directly  to the skin and wash                       Gently with a scrungie or clean washcloth.  5.  Apply the CHG Soap to your body ONLY FROM THE NECK DOWN.   Do not use on face/ open                           Wound or open sores. Avoid contact with eyes, ears mouth and genitals (private parts).                       Wash face,  Genitals (private parts) with your normal soap.             6.  Wash thoroughly, paying special attention to the area where your surgery  will be performed.  7.  Thoroughly rinse your body with warm water  from the neck down.  8.  DO NOT shower/wash with your normal soap after using and rinsing off  the CHG Soap.                9.  Pat yourself dry with a clean towel.            10.  Wear clean pajamas.            11.  Place clean sheets on your bed the night of your first shower and do not  sleep with pets. Day of Surgery : Do not apply any lotions/deodorants the morning of surgery.  Please wear clean clothes to the hospital/surgery center.  FAILURE TO FOLLOW THESE INSTRUCTIONS MAY RESULT IN THE  CANCELLATION OF YOUR SURGERY PATIENT SIGNATURE_________________________________  NURSE SIGNATURE__________________________________  ________________________________________________________________________

## 2024-07-10 ENCOUNTER — Encounter (HOSPITAL_COMMUNITY): Admission: RE | Admit: 2024-07-10 | Source: Ambulatory Visit

## 2024-07-13 ENCOUNTER — Encounter: Admission: RE | Payer: Self-pay

## 2024-07-13 ENCOUNTER — Ambulatory Visit (HOSPITAL_COMMUNITY): Admission: RE | Admit: 2024-07-13 | Admitting: Urology

## 2024-07-13 ENCOUNTER — Encounter (HOSPITAL_COMMUNITY): Payer: Self-pay | Admitting: Medical

## 2024-08-29 ENCOUNTER — Other Ambulatory Visit (HOSPITAL_COMMUNITY)
